# Patient Record
Sex: Male | Born: 1945
Health system: Southern US, Community
[De-identification: ages and names within clinical notes are randomized; demographics above are authoritative.]

## PROBLEM LIST (undated history)

## (undated) DIAGNOSIS — M1990 Unspecified osteoarthritis, unspecified site: Secondary | ICD-10-CM

## (undated) DIAGNOSIS — IMO0001 Reserved for inherently not codable concepts without codable children: Secondary | ICD-10-CM

## (undated) DIAGNOSIS — N183 Chronic kidney disease, stage 3 unspecified: Secondary | ICD-10-CM

## (undated) DIAGNOSIS — H27139 Posterior dislocation of lens, unspecified eye: Secondary | ICD-10-CM

## (undated) DIAGNOSIS — J189 Pneumonia, unspecified organism: Secondary | ICD-10-CM

## (undated) DIAGNOSIS — I10 Essential (primary) hypertension: Secondary | ICD-10-CM

## (undated) DIAGNOSIS — E7800 Pure hypercholesterolemia, unspecified: Secondary | ICD-10-CM

## (undated) DIAGNOSIS — Z5189 Encounter for other specified aftercare: Secondary | ICD-10-CM

## (undated) DIAGNOSIS — I4819 Other persistent atrial fibrillation: Secondary | ICD-10-CM

## (undated) DIAGNOSIS — I428 Other cardiomyopathies: Secondary | ICD-10-CM

## (undated) HISTORY — PX: HERNIA REPAIR: SHX51

## (undated) HISTORY — DX: Posterior dislocation of lens, unspecified eye: H27.139

## (undated) HISTORY — DX: Other persistent atrial fibrillation: I48.19

## (undated) HISTORY — PX: OTHER SURGICAL HISTORY: SHX169

## (undated) HISTORY — DX: Essential (primary) hypertension: I10

## (undated) HISTORY — PX: APPENDECTOMY: SHX54

## (undated) HISTORY — DX: Other cardiomyopathies: I42.8

## (undated) HISTORY — PX: FRACTURE SURGERY: SHX138

---

## 1980-06-12 HISTORY — PX: OTHER SURGICAL HISTORY: SHX169

## 1999-01-20 ENCOUNTER — Other Ambulatory Visit: Admission: RE | Admit: 1999-01-20 | Discharge: 1999-01-20 | Payer: Self-pay | Admitting: Orthopedic Surgery

## 2001-06-01 ENCOUNTER — Emergency Department (HOSPITAL_COMMUNITY): Admission: EM | Admit: 2001-06-01 | Discharge: 2001-06-01 | Payer: Self-pay | Admitting: *Deleted

## 2003-01-12 ENCOUNTER — Ambulatory Visit (HOSPITAL_COMMUNITY): Admission: RE | Admit: 2003-01-12 | Discharge: 2003-01-12 | Payer: Self-pay | Admitting: Gastroenterology

## 2005-06-29 ENCOUNTER — Ambulatory Visit (HOSPITAL_COMMUNITY): Admission: RE | Admit: 2005-06-29 | Discharge: 2005-06-30 | Payer: Self-pay | Admitting: Neurosurgery

## 2006-06-18 ENCOUNTER — Encounter: Admission: RE | Admit: 2006-06-18 | Discharge: 2006-06-18 | Payer: Self-pay | Admitting: Family Medicine

## 2006-07-13 HISTORY — PX: ANTERIOR CERVICAL DISCECTOMY: SHX1160

## 2011-08-13 HISTORY — PX: CATARACT EXTRACTION W/ INTRAOCULAR LENS IMPLANT: SHX1309

## 2011-09-03 HISTORY — PX: CATARACT EXTRACTION W/ INTRAOCULAR LENS IMPLANT: SHX1309

## 2011-10-01 ENCOUNTER — Encounter (INDEPENDENT_AMBULATORY_CARE_PROVIDER_SITE_OTHER): Payer: 59 | Admitting: Ophthalmology

## 2011-10-01 DIAGNOSIS — H27139 Posterior dislocation of lens, unspecified eye: Secondary | ICD-10-CM

## 2011-10-01 DIAGNOSIS — H43819 Vitreous degeneration, unspecified eye: Secondary | ICD-10-CM

## 2011-10-02 ENCOUNTER — Other Ambulatory Visit (INDEPENDENT_AMBULATORY_CARE_PROVIDER_SITE_OTHER): Payer: Self-pay | Admitting: Ophthalmology

## 2011-10-02 DIAGNOSIS — T8522XA Displacement of intraocular lens, initial encounter: Secondary | ICD-10-CM

## 2011-10-02 DIAGNOSIS — H27139 Posterior dislocation of lens, unspecified eye: Secondary | ICD-10-CM

## 2011-10-02 HISTORY — DX: Displacement of intraocular lens, initial encounter: T85.22XA

## 2011-10-02 NOTE — H&P (Signed)
KALONJI ZURAWSKI is an 66 y.o. male.   Chief Complaint: Distorted and poor vision since cataract surgery Right eye HPI: Had cataract surgery Aug 13, 2011.  The vision has been poor since.  PMH: Negative for MI, HIV, Hep  Surgeries:  C-spine, Hernia, Right arm   FMH: Diabetes  Social History:  No smoking.  No Alcohol  Allergies: No known drug allergies  No current facility-administered medications on file as of .   No current outpatient prescriptions on file as of .    Review of systems otherwise negative  There were no vitals taken for this visit.  Physical exam: Mental status: oriented x3. Eyes: See eye exam associated with this surgery on this date.  Scanned in by scanning center Ears, Nose, Throat: within normal limits Neck: Within Normal limits General: within normal limits Chest: Within normal limits Breast: deferred Heart: Within normal limits Abdomen: Within normal limits GU: deferred Extremities: within normal limits Skin: within normal limits  Assessment/Plan Dislocated Intra Ocular Lens Right eye Plan: To [facility] for Pars Plana Vitrectomy, Removal of Intra Ocular Lens, Placement of secondary IOL, Laser treatment. Removal of cataract lens cortex Right eye  Sherrie George 10/02/2011, 7:29 AM 2

## 2011-10-06 ENCOUNTER — Encounter (HOSPITAL_COMMUNITY): Payer: Self-pay | Admitting: Pharmacy Technician

## 2011-10-07 ENCOUNTER — Other Ambulatory Visit (HOSPITAL_COMMUNITY): Payer: Self-pay | Admitting: *Deleted

## 2011-10-12 ENCOUNTER — Encounter (HOSPITAL_COMMUNITY): Payer: Self-pay | Admitting: *Deleted

## 2011-10-19 MED ORDER — GATIFLOXACIN 0.5 % OP SOLN
1.0000 [drp] | OPHTHALMIC | Status: AC | PRN
  Administered 2011-10-20 (×3): 1 [drp] via OPHTHALMIC
  Filled 2011-10-19: qty 2.5

## 2011-10-19 MED ORDER — TROPICAMIDE 1 % OP SOLN
1.0000 [drp] | OPHTHALMIC | Status: AC | PRN
  Administered 2011-10-20 (×3): 1 [drp] via OPHTHALMIC
  Filled 2011-10-19: qty 3

## 2011-10-19 MED ORDER — CYCLOPENTOLATE HCL 1 % OP SOLN
1.0000 [drp] | OPHTHALMIC | Status: AC | PRN
  Administered 2011-10-20 (×3): 1 [drp] via OPHTHALMIC
  Filled 2011-10-19: qty 2

## 2011-10-19 MED ORDER — CEFAZOLIN SODIUM-DEXTROSE 2-3 GM-% IV SOLR
2.0000 g | INTRAVENOUS | Status: AC
  Administered 2011-10-20: 2 g via INTRAVENOUS
  Filled 2011-10-19: qty 50

## 2011-10-19 MED ORDER — PHENYLEPHRINE HCL 2.5 % OP SOLN
1.0000 [drp] | OPHTHALMIC | Status: AC | PRN
  Administered 2011-10-20 (×3): 1 [drp] via OPHTHALMIC
  Filled 2011-10-19: qty 3

## 2011-10-20 ENCOUNTER — Ambulatory Visit (HOSPITAL_COMMUNITY): Payer: 59

## 2011-10-20 ENCOUNTER — Encounter (HOSPITAL_COMMUNITY): Payer: Self-pay | Admitting: Anesthesiology

## 2011-10-20 ENCOUNTER — Encounter (HOSPITAL_COMMUNITY)
Admission: RE | Disposition: A | Discharge status: Home / Self Care | Payer: Self-pay | Source: Ambulatory Visit | Attending: Ophthalmology

## 2011-10-20 ENCOUNTER — Ambulatory Visit (HOSPITAL_COMMUNITY): Payer: 59 | Admitting: Anesthesiology

## 2011-10-20 ENCOUNTER — Encounter (HOSPITAL_COMMUNITY): Payer: Self-pay | Admitting: *Deleted

## 2011-10-20 ENCOUNTER — Ambulatory Visit (HOSPITAL_COMMUNITY)
Admission: RE | Admit: 2011-10-20 | Discharge: 2011-10-21 | Disposition: A | Discharge status: Home / Self Care | Payer: 59 | Source: Ambulatory Visit | Attending: Ophthalmology | Admitting: Ophthalmology

## 2011-10-20 DIAGNOSIS — Y831 Surgical operation with implant of artificial internal device as the cause of abnormal reaction of the patient, or of later complication, without mention of misadventure at the time of the procedure: Secondary | ICD-10-CM | POA: Insufficient documentation

## 2011-10-20 DIAGNOSIS — I10 Essential (primary) hypertension: Secondary | ICD-10-CM

## 2011-10-20 DIAGNOSIS — H27139 Posterior dislocation of lens, unspecified eye: Secondary | ICD-10-CM

## 2011-10-20 DIAGNOSIS — T8522XA Displacement of intraocular lens, initial encounter: Secondary | ICD-10-CM

## 2011-10-20 DIAGNOSIS — Z9849 Cataract extraction status, unspecified eye: Secondary | ICD-10-CM | POA: Insufficient documentation

## 2011-10-20 DIAGNOSIS — T8529XA Other mechanical complication of intraocular lens, initial encounter: Secondary | ICD-10-CM | POA: Insufficient documentation

## 2011-10-20 HISTORY — PX: GAS/FLUID EXCHANGE: SHX5334

## 2011-10-20 HISTORY — DX: Pure hypercholesterolemia, unspecified: E78.00

## 2011-10-20 HISTORY — PX: INTRAOCULAR LENS EXCHANGE: SHX1841

## 2011-10-20 HISTORY — DX: Reserved for inherently not codable concepts without codable children: IMO0001

## 2011-10-20 HISTORY — PX: PARS PLANA VITRECTOMY: SHX2166

## 2011-10-20 HISTORY — DX: Essential (primary) hypertension: I10

## 2011-10-20 HISTORY — DX: Encounter for other specified aftercare: Z51.89

## 2011-10-20 HISTORY — DX: Pneumonia, unspecified organism: J18.9

## 2011-10-20 HISTORY — DX: Unspecified osteoarthritis, unspecified site: M19.90

## 2011-10-20 LAB — CBC
MCV: 85.6 fL (ref 78.0–100.0)
Platelets: 196 10*3/uL (ref 150–400)
RBC: 5.08 MIL/uL (ref 4.22–5.81)
WBC: 7.5 10*3/uL (ref 4.0–10.5)

## 2011-10-20 LAB — BASIC METABOLIC PANEL
CO2: 27 mEq/L (ref 19–32)
Calcium: 9.5 mg/dL (ref 8.4–10.5)
GFR calc Af Amer: 59 mL/min — ABNORMAL LOW (ref >90)
GFR calc non Af Amer: 51 mL/min — ABNORMAL LOW (ref >90)
Sodium: 142 mEq/L (ref 135–145)

## 2011-10-20 LAB — SURGICAL PCR SCREEN
MRSA, PCR: NEGATIVE
Staphylococcus aureus: NEGATIVE

## 2011-10-20 SURGERY — PARS PLANA VITRECTOMY WITH 25G REMOVAL/SUTURE INTRAOCULAR LENS
Anesthesia: General | Site: Eye | Laterality: Right | Wound class: Clean

## 2011-10-20 MED ORDER — TETRACAINE HCL 0.5 % OP SOLN
2.0000 [drp] | Freq: Once | OPHTHALMIC | Status: DC
  Filled 2011-10-20: qty 2

## 2011-10-20 MED ORDER — FENTANYL CITRATE 0.05 MG/ML IJ SOLN
INTRAMUSCULAR | Status: DC | PRN
  Administered 2011-10-20 (×2): 25 ug via INTRAVENOUS
  Administered 2011-10-20: 100 ug via INTRAVENOUS

## 2011-10-20 MED ORDER — MIDAZOLAM HCL 5 MG/5ML IJ SOLN
INTRAMUSCULAR | Status: DC | PRN
  Administered 2011-10-20: 1.5 mg via INTRAVENOUS

## 2011-10-20 MED ORDER — BACITRACIN-POLYMYXIN B 500-10000 UNIT/GM OP OINT
TOPICAL_OINTMENT | OPHTHALMIC | Status: DC | PRN
  Administered 2011-10-20: 1 via OPHTHALMIC

## 2011-10-20 MED ORDER — PHENYLEPHRINE HCL 10 MG/ML IJ SOLN
INTRAMUSCULAR | Status: DC | PRN
  Administered 2011-10-20 (×2): 80 ug via INTRAVENOUS
  Administered 2011-10-20: 160 ug via INTRAVENOUS
  Administered 2011-10-20: 80 ug via INTRAVENOUS

## 2011-10-20 MED ORDER — BUPIVACAINE HCL 0.75 % IJ SOLN
INTRAMUSCULAR | Status: DC | PRN
  Administered 2011-10-20: 10 mL

## 2011-10-20 MED ORDER — ACETAZOLAMIDE SODIUM 500 MG IJ SOLR
500.0000 mg | Freq: Once | INTRAMUSCULAR | Status: AC
  Administered 2011-10-21: 500 mg via INTRAVENOUS
  Filled 2011-10-20: qty 500

## 2011-10-20 MED ORDER — STERILE WATER FOR IRRIGATION IR SOLN
Status: DC | PRN
  Administered 2011-10-20: 200 mL

## 2011-10-20 MED ORDER — EPINEPHRINE HCL 1 MG/ML IJ SOLN
INTRAOCULAR | Status: DC | PRN
  Administered 2011-10-20: 14:00:00

## 2011-10-20 MED ORDER — MORPHINE SULFATE 2 MG/ML IJ SOLN: 1.0000 mg | INTRAMUSCULAR | Status: DC | PRN

## 2011-10-20 MED ORDER — GATIFLOXACIN 0.5 % OP SOLN
1.0000 [drp] | Freq: Four times a day (QID) | OPHTHALMIC | Status: DC
  Filled 2011-10-20: qty 2.5

## 2011-10-20 MED ORDER — MAGNESIUM HYDROXIDE 400 MG/5ML PO SUSP: 15.0000 mL | Freq: Four times a day (QID) | ORAL | Status: DC | PRN

## 2011-10-20 MED ORDER — BSS IO SOLN
INTRAOCULAR | Status: DC | PRN
  Administered 2011-10-20: 15 mL via INTRAOCULAR

## 2011-10-20 MED ORDER — LIDOCAINE HCL (CARDIAC) 20 MG/ML IV SOLN
INTRAVENOUS | Status: DC | PRN
  Administered 2011-10-20: 60 mg via INTRAVENOUS

## 2011-10-20 MED ORDER — ROCURONIUM BROMIDE 100 MG/10ML IV SOLN
INTRAVENOUS | Status: DC | PRN
  Administered 2011-10-20: 10 mg via INTRAVENOUS
  Administered 2011-10-20: 60 mg via INTRAVENOUS
  Administered 2011-10-20: 5 mg via INTRAVENOUS

## 2011-10-20 MED ORDER — BRIMONIDINE TARTRATE 0.2 % OP SOLN
1.0000 [drp] | Freq: Two times a day (BID) | OPHTHALMIC | Status: DC
  Filled 2011-10-20: qty 5

## 2011-10-20 MED ORDER — SODIUM CHLORIDE 0.45 % IV SOLN
INTRAVENOUS | Status: DC
  Administered 2011-10-20: 16:00:00 via INTRAVENOUS

## 2011-10-20 MED ORDER — PHENYLEPHRINE HCL 10 MG/ML IJ SOLN
10.0000 mg | INTRAVENOUS | Status: DC | PRN
  Administered 2011-10-20: 10 ug/min via INTRAVENOUS

## 2011-10-20 MED ORDER — TEMAZEPAM 15 MG PO CAPS: 15.0000 mg | ORAL_CAPSULE | Freq: Every evening | ORAL | Status: DC | PRN

## 2011-10-20 MED ORDER — BACITRACIN-POLYMYXIN B 500-10000 UNIT/GM OP OINT
1.0000 "application " | TOPICAL_OINTMENT | Freq: Four times a day (QID) | OPHTHALMIC | Status: DC
  Filled 2011-10-20: qty 3.5

## 2011-10-20 MED ORDER — ONDANSETRON HCL 4 MG/2ML IJ SOLN
INTRAMUSCULAR | Status: DC | PRN
  Administered 2011-10-20: 4 mg via INTRAVENOUS

## 2011-10-20 MED ORDER — SODIUM CHLORIDE 0.9 % IV SOLN
INTRAVENOUS | Status: DC
  Administered 2011-10-20 (×3): via INTRAVENOUS

## 2011-10-20 MED ORDER — BSS PLUS IO SOLN
INTRAOCULAR | Status: DC | PRN
  Administered 2011-10-20: 1 via INTRAOCULAR

## 2011-10-20 MED ORDER — NEOSTIGMINE METHYLSULFATE 1 MG/ML IJ SOLN
INTRAMUSCULAR | Status: DC | PRN
  Administered 2011-10-20: 5 mg via INTRAVENOUS

## 2011-10-20 MED ORDER — PROMETHAZINE HCL 25 MG/ML IJ SOLN: 6.2500 mg | INTRAMUSCULAR | Status: DC | PRN

## 2011-10-20 MED ORDER — SODIUM CHLORIDE 0.9 % IR SOLN
Status: DC | PRN
  Administered 2011-10-20: 200 mL

## 2011-10-20 MED ORDER — ONDANSETRON HCL 4 MG/2ML IJ SOLN: 4.0000 mg | Freq: Four times a day (QID) | INTRAMUSCULAR | Status: DC | PRN

## 2011-10-20 MED ORDER — PROPOFOL 10 MG/ML IV EMUL
INTRAVENOUS | Status: DC | PRN
  Administered 2011-10-20: 160 mg via INTRAVENOUS

## 2011-10-20 MED ORDER — GLYCOPYRROLATE 0.2 MG/ML IJ SOLN
INTRAMUSCULAR | Status: DC | PRN
  Administered 2011-10-20: .7 mg via INTRAVENOUS

## 2011-10-20 MED ORDER — OXYCODONE-ACETAMINOPHEN 5-325 MG PO TABS
1.0000 | ORAL_TABLET | ORAL | Status: DC | PRN
  Administered 2011-10-20 – 2011-10-21 (×2): 1 via ORAL
  Filled 2011-10-20 (×2): qty 1

## 2011-10-20 MED ORDER — MUPIROCIN 2 % EX OINT
TOPICAL_OINTMENT | CUTANEOUS | Status: AC
  Administered 2011-10-20: 1 via NASAL
  Filled 2011-10-20: qty 22

## 2011-10-20 MED ORDER — HYDROMORPHONE HCL PF 1 MG/ML IJ SOLN: 0.2500 mg | INTRAMUSCULAR | Status: DC | PRN

## 2011-10-20 MED ORDER — SODIUM CHLORIDE 0.9 % IJ SOLN
INTRAMUSCULAR | Status: DC | PRN
  Administered 2011-10-20: 14:00:00

## 2011-10-20 MED ORDER — HEMOSTATIC AGENTS (NO CHARGE) OPTIME
TOPICAL | Status: DC | PRN
  Administered 2011-10-20: 1

## 2011-10-20 MED ORDER — PREDNISOLONE ACETATE 1 % OP SUSP
1.0000 [drp] | Freq: Four times a day (QID) | OPHTHALMIC | Status: DC
  Filled 2011-10-20: qty 1

## 2011-10-20 MED ORDER — DEXAMETHASONE SODIUM PHOSPHATE 10 MG/ML IJ SOLN
INTRAMUSCULAR | Status: DC | PRN
  Administered 2011-10-20: 10 mg

## 2011-10-20 MED ORDER — LATANOPROST 0.005 % OP SOLN
1.0000 [drp] | Freq: Every day | OPHTHALMIC | Status: DC
  Filled 2011-10-20: qty 2.5

## 2011-10-20 MED ORDER — PROVISC 10 MG/ML IO SOLN
INTRAOCULAR | Status: DC | PRN
  Administered 2011-10-20: .85 mL via INTRAOCULAR

## 2011-10-20 MED ORDER — MEPERIDINE HCL 25 MG/ML IJ SOLN: 6.2500 mg | INTRAMUSCULAR | Status: DC | PRN

## 2011-10-20 MED ORDER — ACETAMINOPHEN 325 MG PO TABS: 325.0000 mg | ORAL_TABLET | ORAL | Status: DC | PRN

## 2011-10-20 SURGICAL SUPPLY — 65 items
ACCESSORY FRAGMATOME (MISCELLANEOUS) IMPLANT
APPLICATOR DR MATTHEWS STRL (MISCELLANEOUS) ×10 IMPLANT
BLADE EYE CATARACT 19 1.4 BEAV (BLADE) IMPLANT
BLADE KERATOME 2.75 (BLADE) ×2 IMPLANT
CANNULA FLEX TIP 25G (CANNULA) IMPLANT
CLOTH BEACON ORANGE TIMEOUT ST (SAFETY) ×2 IMPLANT
CORDS BIPOLAR (ELECTRODE) IMPLANT
COTTONBALL LRG STERILE PKG (GAUZE/BANDAGES/DRESSINGS) ×6 IMPLANT
COVER MAYO STAND STRL (DRAPES) IMPLANT
DRAPE OPHTHALMIC 77X100 STRL (CUSTOM PROCEDURE TRAY) ×2 IMPLANT
FILTER BLUE MILLIPORE (MISCELLANEOUS) IMPLANT
FORCEPS ECKARDT ILM 25G SERR (OPHTHALMIC RELATED) IMPLANT
FORCEPS HORIZONTAL 25G DISP (OPHTHALMIC RELATED) ×2 IMPLANT
GAS OPHTHALMIC (MISCELLANEOUS) IMPLANT
GLOVE BIOGEL PI IND STRL 6.5 (GLOVE) ×1 IMPLANT
GLOVE BIOGEL PI INDICATOR 6.5 (GLOVE) ×1
GLOVE ECLIPSE 6.5 STRL STRAW (GLOVE) ×2 IMPLANT
GLOVE SS BIOGEL STRL SZ 6.5 (GLOVE) ×1 IMPLANT
GLOVE SS BIOGEL STRL SZ 7 (GLOVE) ×1 IMPLANT
GLOVE SUPERSENSE BIOGEL SZ 6.5 (GLOVE) ×1
GLOVE SUPERSENSE BIOGEL SZ 7 (GLOVE) ×1
GLOVE SURG 8.5 LATEX PF (GLOVE) ×2 IMPLANT
GOWN STRL NON-REIN LRG LVL3 (GOWN DISPOSABLE) ×10 IMPLANT
ILLUMINATOR CHOW PICK 25GA (MISCELLANEOUS) ×2 IMPLANT
KIT BASIN OR (CUSTOM PROCEDURE TRAY) ×2 IMPLANT
KIT ROOM TURNOVER OR (KITS) ×2 IMPLANT
KNIFE CRESCENT 1.75 EDGEAHEAD (BLADE) ×2 IMPLANT
LENS IOL POST 1PIECE DIOP 17.0 (Intraocular Lens) ×2 IMPLANT
MARKER SKIN DUAL TIP RULER LAB (MISCELLANEOUS) IMPLANT
MASK EYE SHIELD (GAUZE/BANDAGES/DRESSINGS) ×2 IMPLANT
MICROPICK 25G (MISCELLANEOUS)
NEEDLE 18GX1X1/2 (RX/OR ONLY) (NEEDLE) ×2 IMPLANT
NEEDLE 25GX 5/8IN NON SAFETY (NEEDLE) ×2 IMPLANT
NEEDLE 27GAX1X1/2 (NEEDLE) ×2 IMPLANT
NEEDLE FILTER BLUNT 18X 1/2SAF (NEEDLE) ×1
NEEDLE FILTER BLUNT 18X1 1/2 (NEEDLE) ×1 IMPLANT
NEEDLE HYPO 30X.5 LL (NEEDLE) ×4 IMPLANT
NS IRRIG 1000ML POUR BTL (IV SOLUTION) ×2 IMPLANT
PACK VITRECTOMY CUSTOM (CUSTOM PROCEDURE TRAY) ×2 IMPLANT
PAD ARMBOARD 7.5X6 YLW CONV (MISCELLANEOUS) ×4 IMPLANT
PAD EYE OVAL STERILE LF (GAUZE/BANDAGES/DRESSINGS) ×4 IMPLANT
PAK VITRECTOMY PIK 25 GA (OPHTHALMIC RELATED) ×2 IMPLANT
PENCIL BIPOLAR 25GA STR DISP (OPHTHALMIC RELATED) IMPLANT
PICK MICROPICK 25G (MISCELLANEOUS) IMPLANT
PROBE DIRECTIONAL LASER (MISCELLANEOUS) IMPLANT
PROBE VITREOUS 25+ ACCURUS (MISCELLANEOUS) IMPLANT
ROLLS DENTAL (MISCELLANEOUS) ×4 IMPLANT
SCRAPER DIAMOND 25GA (OPHTHALMIC RELATED) IMPLANT
SPONGE SURGIFOAM ABS GEL 12-7 (HEMOSTASIS) ×2 IMPLANT
STOPCOCK 4 WAY LG BORE MALE ST (IV SETS) IMPLANT
SUT CHROMIC 7 0 TG140 8 (SUTURE) IMPLANT
SUT ETHILON 10 0 CS140 6 (SUTURE) ×2 IMPLANT
SUT ETHILON 9 0 BV100 4 (SUTURE) IMPLANT
SUT POLY NON ABSORB 10-0 8 STR (SUTURE) ×4 IMPLANT
SUT SILK 4 0 RB 1 (SUTURE) IMPLANT
SYR 20CC LL (SYRINGE) ×2 IMPLANT
SYR 5ML LL (SYRINGE) IMPLANT
SYR BULB 3OZ (MISCELLANEOUS) ×2 IMPLANT
SYR TB 1ML LUER SLIP (SYRINGE) ×2 IMPLANT
SYRINGE 10CC LL (SYRINGE) IMPLANT
TAPE SURG TRANSPORE 1 IN (GAUZE/BANDAGES/DRESSINGS) ×1 IMPLANT
TAPE SURGICAL TRANSPORE 1 IN (GAUZE/BANDAGES/DRESSINGS) ×1
TOWEL OR 17X24 6PK STRL BLUE (TOWEL DISPOSABLE) ×6 IMPLANT
TROCAR CANNULA 25GA (CANNULA) IMPLANT
WATER STERILE IRR 1000ML POUR (IV SOLUTION) ×2 IMPLANT

## 2011-10-20 NOTE — Transfer of Care (Signed)
Immediate Anesthesia Transfer of Care Note  Patient: John Cobb  Procedure(s) Performed: Procedure(s) (LRB): PARS PLANA VITRECTOMY WITH 25G REMOVAL/SUTURE INTRAOCULAR LENS (Right) LASER PHOTO ABLATION (Right) GAS/FLUID EXCHANGE (Right)  Patient Location: PACU  Anesthesia Type: General  Level of Consciousness: awake and alert   Airway & Oxygen Therapy: Patient Spontanous Breathing and Patient connected to nasal cannula oxygen  Post-op Assessment: Report given to PACU RN and Post -op Vital signs reviewed and stable  Post vital signs: Reviewed and stable  Complications: No apparent anesthesia complications

## 2011-10-20 NOTE — Brief Op Note (Signed)
Brief Operative note   Preoperative diagnosis:  Pre-Op Diagnosis Codes:    * Posterior dislocation of lens [379.34] Postoperative diagnosis  Post-Op Diagnosis Codes:    * Posterior dislocation of lens [379.34]  Procedures: Pars Plana Vitrectomy, removal of IOL from vitreous, Laser, Placement of secondary IOL with suture right eye  Surgeon:  Sherrie George, MD...  Assistant:  Rosalie Doctor SA    Anesthesia: General  Specimen: none  Estimated blood loss:  1cc  Complications: none  Patient sent to PACU in good condition  Composed by Sherrie George MD  Dictation number: [phone]

## 2011-10-20 NOTE — Anesthesia Preprocedure Evaluation (Signed)
Anesthesia Evaluation  Patient identified by MRN, date of birth, ID band Patient awake    Reviewed: Allergy & Precautions, H&P , NPO status , Patient's Chart, lab work & pertinent test results  History of Anesthesia Complications Negative for: history of anesthetic complications  Airway Mallampati: I  Neck ROM: Full    Dental  (+) Teeth Intact   Pulmonary neg pulmonary ROS,  breath sounds clear to auscultation        Cardiovascular hypertension, Rhythm:Regular Rate:Normal     Neuro/Psych    GI/Hepatic negative GI ROS, Neg liver ROS,   Endo/Other  negative endocrine ROS  Renal/GU negative Renal ROS     Musculoskeletal   Abdominal   Peds  Hematology negative hematology ROS (+)   Anesthesia Other Findings   Reproductive/Obstetrics                           Anesthesia Physical Anesthesia Plan  ASA: II  Anesthesia Plan: General   Post-op Pain Management:    Induction: Intravenous  Airway Management Planned: Oral ETT  Additional Equipment:   Intra-op Plan:   Post-operative Plan: Extubation in OR  Informed Consent: I have reviewed the patients History and Physical, chart, labs and discussed the procedure including the risks, benefits and alternatives for the proposed anesthesia with the patient or authorized representative who has indicated his/her understanding and acceptance.   Dental advisory given  Plan Discussed with: CRNA and Surgeon  Anesthesia Plan Comments:         Anesthesia Quick Evaluation

## 2011-10-20 NOTE — H&P (Signed)
I examined the patient today and there is no change in the medical status 

## 2011-10-20 NOTE — Anesthesia Postprocedure Evaluation (Signed)
  Anesthesia Post-op Note  Patient: John Cobb  Procedure(s) Performed: Procedure(s) (LRB): PARS PLANA VITRECTOMY WITH 25G REMOVAL/SUTURE INTRAOCULAR LENS (Right) LASER PHOTO ABLATION (Right) GAS/FLUID EXCHANGE (Right)  Patient Location: PACU  Anesthesia Type: General  Level of Consciousness: awake  Airway and Oxygen Therapy: Patient Spontanous Breathing  Post-op Pain: mild  Post-op Assessment: Post-op Vital signs reviewed  Post-op Vital Signs: stable  Complications: No apparent anesthesia complications

## 2011-10-20 NOTE — Discharge Summary (Signed)
Discharge summary not needed on [facility] patients per medical records. 

## 2011-10-21 ENCOUNTER — Encounter (HOSPITAL_COMMUNITY): Payer: Self-pay | Admitting: Ophthalmology

## 2011-10-21 MED ORDER — GATIFLOXACIN 0.5 % OP SOLN: 1.0000 [drp] | Freq: Four times a day (QID) | OPHTHALMIC | Status: DC

## 2011-10-21 MED ORDER — BACITRACIN-POLYMYXIN B 500-10000 UNIT/GM OP OINT: 1.0000 "application " | TOPICAL_OINTMENT | Freq: Four times a day (QID) | OPHTHALMIC | Status: AC

## 2011-10-21 MED ORDER — PREDNISOLONE ACETATE 1 % OP SUSP: 1.0000 [drp] | Freq: Four times a day (QID) | OPHTHALMIC | Status: AC

## 2011-10-21 NOTE — Progress Notes (Signed)
10/21/2011, 6:53 AM  Mental Status:  Awake, Alert, Oriented  Anterior segment: Cornea  Clear    Anterior Chamber Clear    Lens:    IOL  Intra Ocular Pressure 20 mmHg with Tonopen  Vitreous: Clear 20%gas bubble  Retina:  Attached Good laser reaction  Impression: Excellent result Retina attached  Final Diagnosis: Principal Problem:  *Dislocated IOL (intraocular lens), posterior   Plan: start post operative eye drops.  Discharge to home.  Give post operative instructions  Sherrie George 10/21/2011, 6:53 AM

## 2011-10-21 NOTE — Discharge Summary (Signed)
Discharge summary not needed on [facility] patients per medical records. 

## 2011-10-21 NOTE — Op Note (Signed)
NAMETORIAN, QUINTERO               ACCOUNT NO.:  [id number]  MEDICAL RECORD NO.:  [id number]  LOCATION:  5127                         FACILITY:  [facility]  PHYSICIAN:  Beulah Gandy. Ashley Royalty, M.D. DATE OF BIRTH:  10-25-1945  DATE OF PROCEDURE:  10/20/2011 DATE OF DISCHARGE:                              OPERATIVE REPORT   ADMISSION DIAGNOSIS:  Dislocated intraocular lens in the vitreous, right eye.  PROCEDURES:  Pars plana vitrectomy, removal of dislocated intraocular lens from the vitreous, retinal photocoagulation, placement of secondary intraocular lens with suture, and gas-fluid exchange in the right eye.  SURGEON:  Beulah Gandy. Ashley Royalty, MD  ASSISTANT:  Rosalie Doctor, MA  ANESTHESIA:  General.  DETAILS:  Usual prep and drape.  Conjunctival peritomy from 8 o'clock around to 4 o'clock.  The limbus was exposed from 8 o'clock around to 4 o'clock.  The half-thickness scleral flaps were raised at 3 and 9 oclock. Hemostasis was obtained with diathermy.  The 25-gauge trocars were placed at 8, 10, and 2 o'clock.  A 3 layered corneoscleral wound was created from 10 o'clock to 2 o'clock.  The pars plana vitrectomy was begun just behind the pupillary axis.  Infusion was placed at 8 o'clock.  The intraocular lens was immediately seen.  The vitreous was trimmed from around the intraocular lens and it was freed in the mid vitreous.  The 3 layered corneoscleral wound was opened with keratome, and the lens was passed from posterior segment into the anterior chamber and out through the corneal wound.  Additional vitrectomy was carried out at this point removing all core vitreous and mid peripheral vitreous.  The vitreous, which had prolapsed through the wound also was removed.  There were no vitreous adhesions at this point.  The capsular remnants were trimmed with the vitreous cutter, and cortical material was removed at 12 o'clock.  The vitrectomy was carried posteriorly, so additional vitreous and  pigment was removed from the macular surface.  Two 10-0 Prolene sutures were placed from 3 o'clock to 9 o'clock posterior to the iris and anterior to the capsular remnants.  These sutures were externalized through the corneal wound.  A new intraocular lens made by [employer], model CC70BD, power 17.0 D, length 12.5 mm, optic 7.0 mm, serial number [id number] was brought onto the field.  The expiration date was June 2016.  The lens was inspected and cleaned.  The sutures were externalized through the corneoscleral wound and attached to the eyelets of the lens.  They were trimmed appropriately.  The lens was then passed into the anterior chamber then into the posterior chamber, dialed into place, and the sutures were pulled securely beneath the scleral flaps.  The lens was found to be in proper position and the 10-0 Prolenes were sutured beneath the scleral flap.  The flaps were allowed to lie over the suture knots.  The corneal wound was closed with 5 interrupted 10-0 nylon sutures.  The wound was tested and found to be secure.  Additional vitrectomy was carried out at this point removing additional pigment granules from the vitreous cavity and the macular surface.  The vitreous was trimmed back to the vitreous base.  The indirect ophthalmoscope laser was moved into place.  960 burns were placed around the retinal periphery.  The power was 250-300 mW and the size was 1000 mW, duration 0.07 seconds.  The laser was placed around the areas of weakened retina in the periphery.  There were areas of white without pressure and other areas that appeared to be preretinal breaks.  The vitrectomy was continued and then the gas-fluid exchange, 30%, was performed.  The instruments were removed from the eye, and the 25-gauge trocars were removed as well.  The conjunctiva was closed with 7-0 chromic suture.  Polymyxin and gentamicin were irrigated into tenon space.  Marcaine was injected  around the globe for postop pain. Decadron 10 mg was injected into the lower subconjunctival space. Closing pressure was 10 with a Barraquer tonometer.  COMPLICATIONS:  None.  DURATION:  2 hours.  The patient was awakened and taken to recovery in satisfactory condition.     Beulah Gandy. Ashley Royalty, M.D.     JDM/MEDQ  D:  10/20/2011  T:  10/21/2011  Job:  [id number]

## 2011-10-21 NOTE — Progress Notes (Signed)
Discharge home. Home discharge instruction given to patient, no question verbalized. Alert and oriented, not in any distress, right eye still red, no drainage noted.

## 2011-10-27 ENCOUNTER — Inpatient Hospital Stay (INDEPENDENT_AMBULATORY_CARE_PROVIDER_SITE_OTHER): Payer: 59 | Admitting: Ophthalmology

## 2011-10-27 DIAGNOSIS — H2700 Aphakia, unspecified eye: Secondary | ICD-10-CM

## 2011-11-03 ENCOUNTER — Ambulatory Visit (INDEPENDENT_AMBULATORY_CARE_PROVIDER_SITE_OTHER): Payer: 59 | Admitting: Ophthalmology

## 2011-11-03 DIAGNOSIS — H2700 Aphakia, unspecified eye: Secondary | ICD-10-CM

## 2011-11-25 ENCOUNTER — Encounter (INDEPENDENT_AMBULATORY_CARE_PROVIDER_SITE_OTHER): Payer: 59 | Admitting: Ophthalmology

## 2011-11-25 DIAGNOSIS — H2700 Aphakia, unspecified eye: Secondary | ICD-10-CM

## 2012-02-08 ENCOUNTER — Encounter (INDEPENDENT_AMBULATORY_CARE_PROVIDER_SITE_OTHER): Payer: 59 | Admitting: Ophthalmology

## 2012-02-08 DIAGNOSIS — H35359 Cystoid macular degeneration, unspecified eye: Secondary | ICD-10-CM

## 2012-02-08 DIAGNOSIS — I10 Essential (primary) hypertension: Secondary | ICD-10-CM

## 2012-02-08 DIAGNOSIS — H35039 Hypertensive retinopathy, unspecified eye: Secondary | ICD-10-CM

## 2012-02-08 DIAGNOSIS — H43819 Vitreous degeneration, unspecified eye: Secondary | ICD-10-CM

## 2012-03-07 ENCOUNTER — Encounter (INDEPENDENT_AMBULATORY_CARE_PROVIDER_SITE_OTHER): Payer: 59 | Admitting: Ophthalmology

## 2012-03-07 DIAGNOSIS — H35039 Hypertensive retinopathy, unspecified eye: Secondary | ICD-10-CM

## 2012-03-07 DIAGNOSIS — I10 Essential (primary) hypertension: Secondary | ICD-10-CM

## 2012-03-07 DIAGNOSIS — H35359 Cystoid macular degeneration, unspecified eye: Secondary | ICD-10-CM

## 2012-03-07 DIAGNOSIS — H43819 Vitreous degeneration, unspecified eye: Secondary | ICD-10-CM

## 2012-03-28 DIAGNOSIS — T63461A Toxic effect of venom of wasps, accidental (unintentional), initial encounter: Secondary | ICD-10-CM | POA: Diagnosis not present

## 2012-03-28 DIAGNOSIS — T6391XA Toxic effect of contact with unspecified venomous animal, accidental (unintentional), initial encounter: Secondary | ICD-10-CM | POA: Diagnosis not present

## 2012-04-13 DIAGNOSIS — Z23 Encounter for immunization: Secondary | ICD-10-CM | POA: Diagnosis not present

## 2012-04-18 ENCOUNTER — Encounter (INDEPENDENT_AMBULATORY_CARE_PROVIDER_SITE_OTHER): Payer: Medicare Other | Admitting: Ophthalmology

## 2012-04-18 DIAGNOSIS — H35039 Hypertensive retinopathy, unspecified eye: Secondary | ICD-10-CM

## 2012-04-18 DIAGNOSIS — I10 Essential (primary) hypertension: Secondary | ICD-10-CM

## 2012-04-18 DIAGNOSIS — H35359 Cystoid macular degeneration, unspecified eye: Secondary | ICD-10-CM

## 2012-04-18 DIAGNOSIS — H43819 Vitreous degeneration, unspecified eye: Secondary | ICD-10-CM

## 2012-04-25 DIAGNOSIS — T6391XA Toxic effect of contact with unspecified venomous animal, accidental (unintentional), initial encounter: Secondary | ICD-10-CM | POA: Diagnosis not present

## 2012-04-25 DIAGNOSIS — T63461A Toxic effect of venom of wasps, accidental (unintentional), initial encounter: Secondary | ICD-10-CM | POA: Diagnosis not present

## 2012-05-17 DIAGNOSIS — T6391XA Toxic effect of contact with unspecified venomous animal, accidental (unintentional), initial encounter: Secondary | ICD-10-CM | POA: Diagnosis not present

## 2012-05-17 DIAGNOSIS — T63461A Toxic effect of venom of wasps, accidental (unintentional), initial encounter: Secondary | ICD-10-CM | POA: Diagnosis not present

## 2012-05-24 DIAGNOSIS — T6391XA Toxic effect of contact with unspecified venomous animal, accidental (unintentional), initial encounter: Secondary | ICD-10-CM | POA: Diagnosis not present

## 2012-05-24 DIAGNOSIS — T63461A Toxic effect of venom of wasps, accidental (unintentional), initial encounter: Secondary | ICD-10-CM | POA: Diagnosis not present

## 2012-06-20 ENCOUNTER — Encounter (INDEPENDENT_AMBULATORY_CARE_PROVIDER_SITE_OTHER): Payer: Medicare Other | Admitting: Ophthalmology

## 2012-06-20 DIAGNOSIS — H43819 Vitreous degeneration, unspecified eye: Secondary | ICD-10-CM

## 2012-06-20 DIAGNOSIS — H35039 Hypertensive retinopathy, unspecified eye: Secondary | ICD-10-CM

## 2012-06-20 DIAGNOSIS — I10 Essential (primary) hypertension: Secondary | ICD-10-CM

## 2012-06-20 DIAGNOSIS — H35359 Cystoid macular degeneration, unspecified eye: Secondary | ICD-10-CM | POA: Diagnosis not present

## 2012-06-23 DIAGNOSIS — N401 Enlarged prostate with lower urinary tract symptoms: Secondary | ICD-10-CM | POA: Diagnosis not present

## 2012-06-23 DIAGNOSIS — R972 Elevated prostate specific antigen [PSA]: Secondary | ICD-10-CM | POA: Diagnosis not present

## 2012-06-23 DIAGNOSIS — N529 Male erectile dysfunction, unspecified: Secondary | ICD-10-CM | POA: Diagnosis not present

## 2012-06-23 DIAGNOSIS — T6391XA Toxic effect of contact with unspecified venomous animal, accidental (unintentional), initial encounter: Secondary | ICD-10-CM | POA: Diagnosis not present

## 2012-07-15 DIAGNOSIS — R799 Abnormal finding of blood chemistry, unspecified: Secondary | ICD-10-CM | POA: Diagnosis not present

## 2012-07-15 DIAGNOSIS — R7301 Impaired fasting glucose: Secondary | ICD-10-CM | POA: Diagnosis not present

## 2012-07-15 DIAGNOSIS — N529 Male erectile dysfunction, unspecified: Secondary | ICD-10-CM | POA: Diagnosis not present

## 2012-07-15 DIAGNOSIS — R972 Elevated prostate specific antigen [PSA]: Secondary | ICD-10-CM | POA: Diagnosis not present

## 2012-07-15 DIAGNOSIS — E782 Mixed hyperlipidemia: Secondary | ICD-10-CM | POA: Diagnosis not present

## 2012-07-15 DIAGNOSIS — Z0000 Encounter for general adult medical examination without abnormal findings: Secondary | ICD-10-CM | POA: Diagnosis not present

## 2012-07-15 DIAGNOSIS — I10 Essential (primary) hypertension: Secondary | ICD-10-CM | POA: Diagnosis not present

## 2012-07-19 DIAGNOSIS — T63461A Toxic effect of venom of wasps, accidental (unintentional), initial encounter: Secondary | ICD-10-CM | POA: Diagnosis not present

## 2012-07-19 DIAGNOSIS — T6391XA Toxic effect of contact with unspecified venomous animal, accidental (unintentional), initial encounter: Secondary | ICD-10-CM | POA: Diagnosis not present

## 2012-08-23 DIAGNOSIS — T63461A Toxic effect of venom of wasps, accidental (unintentional), initial encounter: Secondary | ICD-10-CM | POA: Diagnosis not present

## 2012-08-23 DIAGNOSIS — T6391XA Toxic effect of contact with unspecified venomous animal, accidental (unintentional), initial encounter: Secondary | ICD-10-CM | POA: Diagnosis not present

## 2012-08-29 ENCOUNTER — Encounter (INDEPENDENT_AMBULATORY_CARE_PROVIDER_SITE_OTHER): Payer: Medicare Other | Admitting: Ophthalmology

## 2012-08-29 DIAGNOSIS — H35039 Hypertensive retinopathy, unspecified eye: Secondary | ICD-10-CM

## 2012-08-29 DIAGNOSIS — H43819 Vitreous degeneration, unspecified eye: Secondary | ICD-10-CM

## 2012-08-29 DIAGNOSIS — H35359 Cystoid macular degeneration, unspecified eye: Secondary | ICD-10-CM

## 2012-08-29 DIAGNOSIS — I10 Essential (primary) hypertension: Secondary | ICD-10-CM

## 2012-09-06 DIAGNOSIS — N63 Unspecified lump in unspecified breast: Secondary | ICD-10-CM | POA: Diagnosis not present

## 2012-09-20 DIAGNOSIS — T6391XA Toxic effect of contact with unspecified venomous animal, accidental (unintentional), initial encounter: Secondary | ICD-10-CM | POA: Diagnosis not present

## 2012-10-13 DIAGNOSIS — H01009 Unspecified blepharitis unspecified eye, unspecified eyelid: Secondary | ICD-10-CM | POA: Diagnosis not present

## 2012-10-13 DIAGNOSIS — H00029 Hordeolum internum unspecified eye, unspecified eyelid: Secondary | ICD-10-CM | POA: Diagnosis not present

## 2012-10-18 DIAGNOSIS — M542 Cervicalgia: Secondary | ICD-10-CM | POA: Diagnosis not present

## 2012-10-25 DIAGNOSIS — T6391XA Toxic effect of contact with unspecified venomous animal, accidental (unintentional), initial encounter: Secondary | ICD-10-CM | POA: Diagnosis not present

## 2012-10-25 DIAGNOSIS — T63461A Toxic effect of venom of wasps, accidental (unintentional), initial encounter: Secondary | ICD-10-CM | POA: Diagnosis not present

## 2012-11-23 DIAGNOSIS — T6391XA Toxic effect of contact with unspecified venomous animal, accidental (unintentional), initial encounter: Secondary | ICD-10-CM | POA: Diagnosis not present

## 2012-11-23 DIAGNOSIS — T63461A Toxic effect of venom of wasps, accidental (unintentional), initial encounter: Secondary | ICD-10-CM | POA: Diagnosis not present

## 2012-11-28 ENCOUNTER — Encounter (INDEPENDENT_AMBULATORY_CARE_PROVIDER_SITE_OTHER): Payer: Medicare Other | Admitting: Ophthalmology

## 2012-11-28 DIAGNOSIS — I10 Essential (primary) hypertension: Secondary | ICD-10-CM

## 2012-11-28 DIAGNOSIS — H35039 Hypertensive retinopathy, unspecified eye: Secondary | ICD-10-CM

## 2012-11-28 DIAGNOSIS — H43819 Vitreous degeneration, unspecified eye: Secondary | ICD-10-CM

## 2012-11-28 DIAGNOSIS — H35359 Cystoid macular degeneration, unspecified eye: Secondary | ICD-10-CM | POA: Diagnosis not present

## 2012-12-21 DIAGNOSIS — J309 Allergic rhinitis, unspecified: Secondary | ICD-10-CM | POA: Diagnosis not present

## 2012-12-21 DIAGNOSIS — T63461A Toxic effect of venom of wasps, accidental (unintentional), initial encounter: Secondary | ICD-10-CM | POA: Diagnosis not present

## 2012-12-21 DIAGNOSIS — T6391XA Toxic effect of contact with unspecified venomous animal, accidental (unintentional), initial encounter: Secondary | ICD-10-CM | POA: Diagnosis not present

## 2012-12-26 DIAGNOSIS — E782 Mixed hyperlipidemia: Secondary | ICD-10-CM | POA: Diagnosis not present

## 2012-12-26 DIAGNOSIS — I10 Essential (primary) hypertension: Secondary | ICD-10-CM | POA: Diagnosis not present

## 2012-12-26 DIAGNOSIS — R7301 Impaired fasting glucose: Secondary | ICD-10-CM | POA: Diagnosis not present

## 2012-12-26 DIAGNOSIS — Z1331 Encounter for screening for depression: Secondary | ICD-10-CM | POA: Diagnosis not present

## 2012-12-26 DIAGNOSIS — R972 Elevated prostate specific antigen [PSA]: Secondary | ICD-10-CM | POA: Diagnosis not present

## 2012-12-26 DIAGNOSIS — Z0000 Encounter for general adult medical examination without abnormal findings: Secondary | ICD-10-CM | POA: Diagnosis not present

## 2012-12-27 DIAGNOSIS — D235 Other benign neoplasm of skin of trunk: Secondary | ICD-10-CM | POA: Diagnosis not present

## 2012-12-27 DIAGNOSIS — H00019 Hordeolum externum unspecified eye, unspecified eyelid: Secondary | ICD-10-CM | POA: Diagnosis not present

## 2012-12-27 DIAGNOSIS — L570 Actinic keratosis: Secondary | ICD-10-CM | POA: Diagnosis not present

## 2012-12-27 DIAGNOSIS — L259 Unspecified contact dermatitis, unspecified cause: Secondary | ICD-10-CM | POA: Diagnosis not present

## 2013-01-25 DIAGNOSIS — T63461A Toxic effect of venom of wasps, accidental (unintentional), initial encounter: Secondary | ICD-10-CM | POA: Diagnosis not present

## 2013-01-25 DIAGNOSIS — H0019 Chalazion unspecified eye, unspecified eyelid: Secondary | ICD-10-CM | POA: Diagnosis not present

## 2013-01-25 DIAGNOSIS — T6391XA Toxic effect of contact with unspecified venomous animal, accidental (unintentional), initial encounter: Secondary | ICD-10-CM | POA: Diagnosis not present

## 2013-02-21 DIAGNOSIS — T6391XA Toxic effect of contact with unspecified venomous animal, accidental (unintentional), initial encounter: Secondary | ICD-10-CM | POA: Diagnosis not present

## 2013-02-21 DIAGNOSIS — N63 Unspecified lump in unspecified breast: Secondary | ICD-10-CM | POA: Diagnosis not present

## 2013-02-21 DIAGNOSIS — Z09 Encounter for follow-up examination after completed treatment for conditions other than malignant neoplasm: Secondary | ICD-10-CM | POA: Diagnosis not present

## 2013-02-21 DIAGNOSIS — T63461A Toxic effect of venom of wasps, accidental (unintentional), initial encounter: Secondary | ICD-10-CM | POA: Diagnosis not present

## 2013-02-28 DIAGNOSIS — Z1211 Encounter for screening for malignant neoplasm of colon: Secondary | ICD-10-CM | POA: Diagnosis not present

## 2013-02-28 DIAGNOSIS — K5730 Diverticulosis of large intestine without perforation or abscess without bleeding: Secondary | ICD-10-CM | POA: Diagnosis not present

## 2013-02-28 DIAGNOSIS — D126 Benign neoplasm of colon, unspecified: Secondary | ICD-10-CM | POA: Diagnosis not present

## 2013-03-24 DIAGNOSIS — T6391XA Toxic effect of contact with unspecified venomous animal, accidental (unintentional), initial encounter: Secondary | ICD-10-CM | POA: Diagnosis not present

## 2013-03-24 DIAGNOSIS — T63461A Toxic effect of venom of wasps, accidental (unintentional), initial encounter: Secondary | ICD-10-CM | POA: Diagnosis not present

## 2013-03-31 ENCOUNTER — Ambulatory Visit (INDEPENDENT_AMBULATORY_CARE_PROVIDER_SITE_OTHER): Payer: Medicare Other | Admitting: Ophthalmology

## 2013-03-31 DIAGNOSIS — H35039 Hypertensive retinopathy, unspecified eye: Secondary | ICD-10-CM | POA: Diagnosis not present

## 2013-03-31 DIAGNOSIS — I10 Essential (primary) hypertension: Secondary | ICD-10-CM | POA: Diagnosis not present

## 2013-03-31 DIAGNOSIS — H35359 Cystoid macular degeneration, unspecified eye: Secondary | ICD-10-CM | POA: Diagnosis not present

## 2013-03-31 DIAGNOSIS — H43819 Vitreous degeneration, unspecified eye: Secondary | ICD-10-CM | POA: Diagnosis not present

## 2013-04-04 DIAGNOSIS — Z23 Encounter for immunization: Secondary | ICD-10-CM | POA: Diagnosis not present

## 2013-04-20 DIAGNOSIS — T63461A Toxic effect of venom of wasps, accidental (unintentional), initial encounter: Secondary | ICD-10-CM | POA: Diagnosis not present

## 2013-04-20 DIAGNOSIS — T6391XA Toxic effect of contact with unspecified venomous animal, accidental (unintentional), initial encounter: Secondary | ICD-10-CM | POA: Diagnosis not present

## 2013-05-26 DIAGNOSIS — T63461A Toxic effect of venom of wasps, accidental (unintentional), initial encounter: Secondary | ICD-10-CM | POA: Diagnosis not present

## 2013-05-26 DIAGNOSIS — T6391XA Toxic effect of contact with unspecified venomous animal, accidental (unintentional), initial encounter: Secondary | ICD-10-CM | POA: Diagnosis not present

## 2013-05-30 DIAGNOSIS — T63461A Toxic effect of venom of wasps, accidental (unintentional), initial encounter: Secondary | ICD-10-CM | POA: Diagnosis not present

## 2013-05-30 DIAGNOSIS — T6391XA Toxic effect of contact with unspecified venomous animal, accidental (unintentional), initial encounter: Secondary | ICD-10-CM | POA: Diagnosis not present

## 2013-06-21 DIAGNOSIS — T63461A Toxic effect of venom of wasps, accidental (unintentional), initial encounter: Secondary | ICD-10-CM | POA: Diagnosis not present

## 2013-06-21 DIAGNOSIS — T6391XA Toxic effect of contact with unspecified venomous animal, accidental (unintentional), initial encounter: Secondary | ICD-10-CM | POA: Diagnosis not present

## 2013-06-27 DIAGNOSIS — N139 Obstructive and reflux uropathy, unspecified: Secondary | ICD-10-CM | POA: Diagnosis not present

## 2013-06-27 DIAGNOSIS — R972 Elevated prostate specific antigen [PSA]: Secondary | ICD-10-CM | POA: Diagnosis not present

## 2013-06-27 DIAGNOSIS — N401 Enlarged prostate with lower urinary tract symptoms: Secondary | ICD-10-CM | POA: Diagnosis not present

## 2013-07-25 DIAGNOSIS — T6391XA Toxic effect of contact with unspecified venomous animal, accidental (unintentional), initial encounter: Secondary | ICD-10-CM | POA: Diagnosis not present

## 2013-07-25 DIAGNOSIS — T63461A Toxic effect of venom of wasps, accidental (unintentional), initial encounter: Secondary | ICD-10-CM | POA: Diagnosis not present

## 2013-08-08 DIAGNOSIS — L719 Rosacea, unspecified: Secondary | ICD-10-CM | POA: Diagnosis not present

## 2013-08-08 DIAGNOSIS — L820 Inflamed seborrheic keratosis: Secondary | ICD-10-CM | POA: Diagnosis not present

## 2013-08-22 DIAGNOSIS — T6391XA Toxic effect of contact with unspecified venomous animal, accidental (unintentional), initial encounter: Secondary | ICD-10-CM | POA: Diagnosis not present

## 2013-08-22 DIAGNOSIS — T63461A Toxic effect of venom of wasps, accidental (unintentional), initial encounter: Secondary | ICD-10-CM | POA: Diagnosis not present

## 2013-08-22 DIAGNOSIS — N63 Unspecified lump in unspecified breast: Secondary | ICD-10-CM | POA: Diagnosis not present

## 2013-09-21 DIAGNOSIS — T63461A Toxic effect of venom of wasps, accidental (unintentional), initial encounter: Secondary | ICD-10-CM | POA: Diagnosis not present

## 2013-09-21 DIAGNOSIS — T6391XA Toxic effect of contact with unspecified venomous animal, accidental (unintentional), initial encounter: Secondary | ICD-10-CM | POA: Diagnosis not present

## 2013-09-28 ENCOUNTER — Ambulatory Visit (INDEPENDENT_AMBULATORY_CARE_PROVIDER_SITE_OTHER): Payer: Medicare Other | Admitting: Ophthalmology

## 2013-09-28 DIAGNOSIS — I10 Essential (primary) hypertension: Secondary | ICD-10-CM | POA: Diagnosis not present

## 2013-09-28 DIAGNOSIS — H43819 Vitreous degeneration, unspecified eye: Secondary | ICD-10-CM | POA: Diagnosis not present

## 2013-09-28 DIAGNOSIS — H35039 Hypertensive retinopathy, unspecified eye: Secondary | ICD-10-CM

## 2013-10-24 DIAGNOSIS — T6391XA Toxic effect of contact with unspecified venomous animal, accidental (unintentional), initial encounter: Secondary | ICD-10-CM | POA: Diagnosis not present

## 2013-10-24 DIAGNOSIS — T63461A Toxic effect of venom of wasps, accidental (unintentional), initial encounter: Secondary | ICD-10-CM | POA: Diagnosis not present

## 2013-11-22 DIAGNOSIS — T63461A Toxic effect of venom of wasps, accidental (unintentional), initial encounter: Secondary | ICD-10-CM | POA: Diagnosis not present

## 2013-11-22 DIAGNOSIS — T6391XA Toxic effect of contact with unspecified venomous animal, accidental (unintentional), initial encounter: Secondary | ICD-10-CM | POA: Diagnosis not present

## 2013-12-27 DIAGNOSIS — R7301 Impaired fasting glucose: Secondary | ICD-10-CM | POA: Diagnosis not present

## 2013-12-27 DIAGNOSIS — Z91038 Other insect allergy status: Secondary | ICD-10-CM | POA: Diagnosis not present

## 2013-12-27 DIAGNOSIS — L719 Rosacea, unspecified: Secondary | ICD-10-CM | POA: Diagnosis not present

## 2013-12-27 DIAGNOSIS — E782 Mixed hyperlipidemia: Secondary | ICD-10-CM | POA: Diagnosis not present

## 2013-12-27 DIAGNOSIS — Z23 Encounter for immunization: Secondary | ICD-10-CM | POA: Diagnosis not present

## 2013-12-27 DIAGNOSIS — R972 Elevated prostate specific antigen [PSA]: Secondary | ICD-10-CM | POA: Diagnosis not present

## 2013-12-27 DIAGNOSIS — Z0000 Encounter for general adult medical examination without abnormal findings: Secondary | ICD-10-CM | POA: Diagnosis not present

## 2013-12-27 DIAGNOSIS — I10 Essential (primary) hypertension: Secondary | ICD-10-CM | POA: Diagnosis not present

## 2014-01-16 DIAGNOSIS — T6391XA Toxic effect of contact with unspecified venomous animal, accidental (unintentional), initial encounter: Secondary | ICD-10-CM | POA: Diagnosis not present

## 2014-01-16 DIAGNOSIS — T63461A Toxic effect of venom of wasps, accidental (unintentional), initial encounter: Secondary | ICD-10-CM | POA: Diagnosis not present

## 2014-02-01 DIAGNOSIS — H35039 Hypertensive retinopathy, unspecified eye: Secondary | ICD-10-CM | POA: Diagnosis not present

## 2014-02-01 DIAGNOSIS — H43819 Vitreous degeneration, unspecified eye: Secondary | ICD-10-CM | POA: Diagnosis not present

## 2014-02-01 DIAGNOSIS — T8529XA Other mechanical complication of intraocular lens, initial encounter: Secondary | ICD-10-CM | POA: Diagnosis not present

## 2014-02-16 DIAGNOSIS — T6391XA Toxic effect of contact with unspecified venomous animal, accidental (unintentional), initial encounter: Secondary | ICD-10-CM | POA: Diagnosis not present

## 2014-02-16 DIAGNOSIS — T63461A Toxic effect of venom of wasps, accidental (unintentional), initial encounter: Secondary | ICD-10-CM | POA: Diagnosis not present

## 2014-02-27 DIAGNOSIS — D1739 Benign lipomatous neoplasm of skin and subcutaneous tissue of other sites: Secondary | ICD-10-CM | POA: Diagnosis not present

## 2014-03-16 DIAGNOSIS — T63461A Toxic effect of venom of wasps, accidental (unintentional), initial encounter: Secondary | ICD-10-CM | POA: Diagnosis not present

## 2014-03-16 DIAGNOSIS — T6391XA Toxic effect of contact with unspecified venomous animal, accidental (unintentional), initial encounter: Secondary | ICD-10-CM | POA: Diagnosis not present

## 2014-04-13 DIAGNOSIS — T63441D Toxic effect of venom of bees, accidental (unintentional), subsequent encounter: Secondary | ICD-10-CM | POA: Diagnosis not present

## 2014-04-13 DIAGNOSIS — T63461D Toxic effect of venom of wasps, accidental (unintentional), subsequent encounter: Secondary | ICD-10-CM | POA: Diagnosis not present

## 2014-04-13 DIAGNOSIS — T63451D Toxic effect of venom of hornets, accidental (unintentional), subsequent encounter: Secondary | ICD-10-CM | POA: Diagnosis not present

## 2014-04-30 DIAGNOSIS — Z23 Encounter for immunization: Secondary | ICD-10-CM | POA: Diagnosis not present

## 2014-05-21 DIAGNOSIS — T63451D Toxic effect of venom of hornets, accidental (unintentional), subsequent encounter: Secondary | ICD-10-CM | POA: Diagnosis not present

## 2014-05-21 DIAGNOSIS — T63461D Toxic effect of venom of wasps, accidental (unintentional), subsequent encounter: Secondary | ICD-10-CM | POA: Diagnosis not present

## 2014-05-21 DIAGNOSIS — T63441D Toxic effect of venom of bees, accidental (unintentional), subsequent encounter: Secondary | ICD-10-CM | POA: Diagnosis not present

## 2014-05-29 DIAGNOSIS — T63441D Toxic effect of venom of bees, accidental (unintentional), subsequent encounter: Secondary | ICD-10-CM | POA: Diagnosis not present

## 2014-05-29 DIAGNOSIS — T63451D Toxic effect of venom of hornets, accidental (unintentional), subsequent encounter: Secondary | ICD-10-CM | POA: Diagnosis not present

## 2014-05-29 DIAGNOSIS — T63461D Toxic effect of venom of wasps, accidental (unintentional), subsequent encounter: Secondary | ICD-10-CM | POA: Diagnosis not present

## 2014-06-21 DIAGNOSIS — T63451D Toxic effect of venom of hornets, accidental (unintentional), subsequent encounter: Secondary | ICD-10-CM | POA: Diagnosis not present

## 2014-06-21 DIAGNOSIS — T63441D Toxic effect of venom of bees, accidental (unintentional), subsequent encounter: Secondary | ICD-10-CM | POA: Diagnosis not present

## 2014-06-21 DIAGNOSIS — T63461D Toxic effect of venom of wasps, accidental (unintentional), subsequent encounter: Secondary | ICD-10-CM | POA: Diagnosis not present

## 2014-06-25 DIAGNOSIS — N410 Acute prostatitis: Secondary | ICD-10-CM | POA: Diagnosis not present

## 2014-06-25 DIAGNOSIS — R3915 Urgency of urination: Secondary | ICD-10-CM | POA: Diagnosis not present

## 2014-06-29 DIAGNOSIS — N401 Enlarged prostate with lower urinary tract symptoms: Secondary | ICD-10-CM | POA: Diagnosis not present

## 2014-06-29 DIAGNOSIS — R3912 Poor urinary stream: Secondary | ICD-10-CM | POA: Diagnosis not present

## 2014-06-29 DIAGNOSIS — R972 Elevated prostate specific antigen [PSA]: Secondary | ICD-10-CM | POA: Diagnosis not present

## 2014-06-29 DIAGNOSIS — N411 Chronic prostatitis: Secondary | ICD-10-CM | POA: Diagnosis not present

## 2014-07-10 DIAGNOSIS — I10 Essential (primary) hypertension: Secondary | ICD-10-CM | POA: Diagnosis not present

## 2014-07-10 DIAGNOSIS — R7301 Impaired fasting glucose: Secondary | ICD-10-CM | POA: Diagnosis not present

## 2014-07-10 DIAGNOSIS — N390 Urinary tract infection, site not specified: Secondary | ICD-10-CM | POA: Diagnosis not present

## 2014-07-10 DIAGNOSIS — E782 Mixed hyperlipidemia: Secondary | ICD-10-CM | POA: Diagnosis not present

## 2014-07-23 DIAGNOSIS — T63451D Toxic effect of venom of hornets, accidental (unintentional), subsequent encounter: Secondary | ICD-10-CM | POA: Diagnosis not present

## 2014-07-23 DIAGNOSIS — T63441D Toxic effect of venom of bees, accidental (unintentional), subsequent encounter: Secondary | ICD-10-CM | POA: Diagnosis not present

## 2014-07-23 DIAGNOSIS — T63461D Toxic effect of venom of wasps, accidental (unintentional), subsequent encounter: Secondary | ICD-10-CM | POA: Diagnosis not present

## 2014-08-10 DIAGNOSIS — N390 Urinary tract infection, site not specified: Secondary | ICD-10-CM | POA: Diagnosis not present

## 2014-08-10 DIAGNOSIS — R972 Elevated prostate specific antigen [PSA]: Secondary | ICD-10-CM | POA: Diagnosis not present

## 2014-08-10 DIAGNOSIS — R3912 Poor urinary stream: Secondary | ICD-10-CM | POA: Diagnosis not present

## 2014-08-24 DIAGNOSIS — T63461D Toxic effect of venom of wasps, accidental (unintentional), subsequent encounter: Secondary | ICD-10-CM | POA: Diagnosis not present

## 2014-08-24 DIAGNOSIS — T63441D Toxic effect of venom of bees, accidental (unintentional), subsequent encounter: Secondary | ICD-10-CM | POA: Diagnosis not present

## 2014-08-24 DIAGNOSIS — T63451D Toxic effect of venom of hornets, accidental (unintentional), subsequent encounter: Secondary | ICD-10-CM | POA: Diagnosis not present

## 2014-09-12 DIAGNOSIS — R972 Elevated prostate specific antigen [PSA]: Secondary | ICD-10-CM | POA: Diagnosis not present

## 2014-09-13 DIAGNOSIS — N63 Unspecified lump in breast: Secondary | ICD-10-CM | POA: Diagnosis not present

## 2014-09-13 DIAGNOSIS — Z09 Encounter for follow-up examination after completed treatment for conditions other than malignant neoplasm: Secondary | ICD-10-CM | POA: Diagnosis not present

## 2014-09-18 DIAGNOSIS — T63461D Toxic effect of venom of wasps, accidental (unintentional), subsequent encounter: Secondary | ICD-10-CM | POA: Diagnosis not present

## 2014-09-18 DIAGNOSIS — T63441D Toxic effect of venom of bees, accidental (unintentional), subsequent encounter: Secondary | ICD-10-CM | POA: Diagnosis not present

## 2014-09-18 DIAGNOSIS — T63451D Toxic effect of venom of hornets, accidental (unintentional), subsequent encounter: Secondary | ICD-10-CM | POA: Diagnosis not present

## 2014-10-02 ENCOUNTER — Ambulatory Visit (INDEPENDENT_AMBULATORY_CARE_PROVIDER_SITE_OTHER): Payer: Medicare Other | Admitting: Ophthalmology

## 2014-10-02 DIAGNOSIS — H35033 Hypertensive retinopathy, bilateral: Secondary | ICD-10-CM

## 2014-10-02 DIAGNOSIS — H43812 Vitreous degeneration, left eye: Secondary | ICD-10-CM | POA: Diagnosis not present

## 2014-10-02 DIAGNOSIS — I10 Essential (primary) hypertension: Secondary | ICD-10-CM | POA: Diagnosis not present

## 2014-10-22 DIAGNOSIS — T63441D Toxic effect of venom of bees, accidental (unintentional), subsequent encounter: Secondary | ICD-10-CM | POA: Diagnosis not present

## 2014-10-22 DIAGNOSIS — T63451D Toxic effect of venom of hornets, accidental (unintentional), subsequent encounter: Secondary | ICD-10-CM | POA: Diagnosis not present

## 2014-10-22 DIAGNOSIS — T63461D Toxic effect of venom of wasps, accidental (unintentional), subsequent encounter: Secondary | ICD-10-CM | POA: Diagnosis not present

## 2014-11-22 DIAGNOSIS — T63451D Toxic effect of venom of hornets, accidental (unintentional), subsequent encounter: Secondary | ICD-10-CM | POA: Diagnosis not present

## 2014-11-22 DIAGNOSIS — T63441D Toxic effect of venom of bees, accidental (unintentional), subsequent encounter: Secondary | ICD-10-CM | POA: Diagnosis not present

## 2014-11-22 DIAGNOSIS — T63461D Toxic effect of venom of wasps, accidental (unintentional), subsequent encounter: Secondary | ICD-10-CM | POA: Diagnosis not present

## 2014-12-13 DIAGNOSIS — N401 Enlarged prostate with lower urinary tract symptoms: Secondary | ICD-10-CM | POA: Diagnosis not present

## 2014-12-17 DIAGNOSIS — N401 Enlarged prostate with lower urinary tract symptoms: Secondary | ICD-10-CM | POA: Diagnosis not present

## 2014-12-17 DIAGNOSIS — N138 Other obstructive and reflux uropathy: Secondary | ICD-10-CM | POA: Diagnosis not present

## 2014-12-17 DIAGNOSIS — R972 Elevated prostate specific antigen [PSA]: Secondary | ICD-10-CM | POA: Diagnosis not present

## 2014-12-17 DIAGNOSIS — R3912 Poor urinary stream: Secondary | ICD-10-CM | POA: Diagnosis not present

## 2014-12-17 DIAGNOSIS — N5201 Erectile dysfunction due to arterial insufficiency: Secondary | ICD-10-CM | POA: Diagnosis not present

## 2014-12-24 DIAGNOSIS — T63441D Toxic effect of venom of bees, accidental (unintentional), subsequent encounter: Secondary | ICD-10-CM | POA: Diagnosis not present

## 2014-12-24 DIAGNOSIS — T63461D Toxic effect of venom of wasps, accidental (unintentional), subsequent encounter: Secondary | ICD-10-CM | POA: Diagnosis not present

## 2014-12-24 DIAGNOSIS — T63451D Toxic effect of venom of hornets, accidental (unintentional), subsequent encounter: Secondary | ICD-10-CM | POA: Diagnosis not present

## 2015-01-22 DIAGNOSIS — T63451D Toxic effect of venom of hornets, accidental (unintentional), subsequent encounter: Secondary | ICD-10-CM | POA: Diagnosis not present

## 2015-01-22 DIAGNOSIS — T63461D Toxic effect of venom of wasps, accidental (unintentional), subsequent encounter: Secondary | ICD-10-CM | POA: Diagnosis not present

## 2015-01-22 DIAGNOSIS — T63441D Toxic effect of venom of bees, accidental (unintentional), subsequent encounter: Secondary | ICD-10-CM | POA: Diagnosis not present

## 2015-02-27 DIAGNOSIS — T63451D Toxic effect of venom of hornets, accidental (unintentional), subsequent encounter: Secondary | ICD-10-CM | POA: Diagnosis not present

## 2015-02-27 DIAGNOSIS — D126 Benign neoplasm of colon, unspecified: Secondary | ICD-10-CM | POA: Diagnosis not present

## 2015-02-27 DIAGNOSIS — T63441D Toxic effect of venom of bees, accidental (unintentional), subsequent encounter: Secondary | ICD-10-CM | POA: Diagnosis not present

## 2015-02-27 DIAGNOSIS — E782 Mixed hyperlipidemia: Secondary | ICD-10-CM | POA: Diagnosis not present

## 2015-02-27 DIAGNOSIS — R7301 Impaired fasting glucose: Secondary | ICD-10-CM | POA: Diagnosis not present

## 2015-02-27 DIAGNOSIS — Z0000 Encounter for general adult medical examination without abnormal findings: Secondary | ICD-10-CM | POA: Diagnosis not present

## 2015-02-27 DIAGNOSIS — I10 Essential (primary) hypertension: Secondary | ICD-10-CM | POA: Diagnosis not present

## 2015-02-27 DIAGNOSIS — N189 Chronic kidney disease, unspecified: Secondary | ICD-10-CM | POA: Diagnosis not present

## 2015-02-27 DIAGNOSIS — T63461D Toxic effect of venom of wasps, accidental (unintentional), subsequent encounter: Secondary | ICD-10-CM | POA: Diagnosis not present

## 2015-03-20 DIAGNOSIS — N63 Unspecified lump in breast: Secondary | ICD-10-CM | POA: Diagnosis not present

## 2015-03-22 DIAGNOSIS — L570 Actinic keratosis: Secondary | ICD-10-CM | POA: Diagnosis not present

## 2015-03-22 DIAGNOSIS — L219 Seborrheic dermatitis, unspecified: Secondary | ICD-10-CM | POA: Diagnosis not present

## 2015-04-02 DIAGNOSIS — T63441D Toxic effect of venom of bees, accidental (unintentional), subsequent encounter: Secondary | ICD-10-CM | POA: Diagnosis not present

## 2015-04-02 DIAGNOSIS — T63451D Toxic effect of venom of hornets, accidental (unintentional), subsequent encounter: Secondary | ICD-10-CM | POA: Diagnosis not present

## 2015-04-02 DIAGNOSIS — T63461D Toxic effect of venom of wasps, accidental (unintentional), subsequent encounter: Secondary | ICD-10-CM | POA: Diagnosis not present

## 2015-04-16 DIAGNOSIS — L814 Other melanin hyperpigmentation: Secondary | ICD-10-CM | POA: Diagnosis not present

## 2015-04-16 DIAGNOSIS — D1801 Hemangioma of skin and subcutaneous tissue: Secondary | ICD-10-CM | POA: Diagnosis not present

## 2015-04-16 DIAGNOSIS — L918 Other hypertrophic disorders of the skin: Secondary | ICD-10-CM | POA: Diagnosis not present

## 2015-04-16 DIAGNOSIS — L719 Rosacea, unspecified: Secondary | ICD-10-CM | POA: Diagnosis not present

## 2015-04-16 DIAGNOSIS — Z23 Encounter for immunization: Secondary | ICD-10-CM | POA: Diagnosis not present

## 2015-04-16 DIAGNOSIS — L821 Other seborrheic keratosis: Secondary | ICD-10-CM | POA: Diagnosis not present

## 2015-04-16 DIAGNOSIS — D229 Melanocytic nevi, unspecified: Secondary | ICD-10-CM | POA: Diagnosis not present

## 2015-04-16 DIAGNOSIS — D485 Neoplasm of uncertain behavior of skin: Secondary | ICD-10-CM | POA: Diagnosis not present

## 2015-04-17 DIAGNOSIS — L820 Inflamed seborrheic keratosis: Secondary | ICD-10-CM | POA: Diagnosis not present

## 2015-04-17 DIAGNOSIS — L918 Other hypertrophic disorders of the skin: Secondary | ICD-10-CM | POA: Diagnosis not present

## 2015-05-06 DIAGNOSIS — T63461D Toxic effect of venom of wasps, accidental (unintentional), subsequent encounter: Secondary | ICD-10-CM | POA: Diagnosis not present

## 2015-05-06 DIAGNOSIS — T63451D Toxic effect of venom of hornets, accidental (unintentional), subsequent encounter: Secondary | ICD-10-CM | POA: Diagnosis not present

## 2015-05-24 DIAGNOSIS — M546 Pain in thoracic spine: Secondary | ICD-10-CM | POA: Diagnosis not present

## 2015-05-31 DIAGNOSIS — T63451D Toxic effect of venom of hornets, accidental (unintentional), subsequent encounter: Secondary | ICD-10-CM | POA: Diagnosis not present

## 2015-05-31 DIAGNOSIS — T63461D Toxic effect of venom of wasps, accidental (unintentional), subsequent encounter: Secondary | ICD-10-CM | POA: Diagnosis not present

## 2015-05-31 DIAGNOSIS — T63441D Toxic effect of venom of bees, accidental (unintentional), subsequent encounter: Secondary | ICD-10-CM | POA: Diagnosis not present

## 2015-06-05 DIAGNOSIS — T63441D Toxic effect of venom of bees, accidental (unintentional), subsequent encounter: Secondary | ICD-10-CM | POA: Diagnosis not present

## 2015-07-01 DIAGNOSIS — T63461D Toxic effect of venom of wasps, accidental (unintentional), subsequent encounter: Secondary | ICD-10-CM | POA: Diagnosis not present

## 2015-07-01 DIAGNOSIS — T63451D Toxic effect of venom of hornets, accidental (unintentional), subsequent encounter: Secondary | ICD-10-CM | POA: Diagnosis not present

## 2015-07-01 DIAGNOSIS — T63441D Toxic effect of venom of bees, accidental (unintentional), subsequent encounter: Secondary | ICD-10-CM | POA: Diagnosis not present

## 2015-07-30 DIAGNOSIS — T63451D Toxic effect of venom of hornets, accidental (unintentional), subsequent encounter: Secondary | ICD-10-CM | POA: Diagnosis not present

## 2015-07-30 DIAGNOSIS — T63461D Toxic effect of venom of wasps, accidental (unintentional), subsequent encounter: Secondary | ICD-10-CM | POA: Diagnosis not present

## 2015-09-03 DIAGNOSIS — T63451D Toxic effect of venom of hornets, accidental (unintentional), subsequent encounter: Secondary | ICD-10-CM | POA: Diagnosis not present

## 2015-09-03 DIAGNOSIS — T63461D Toxic effect of venom of wasps, accidental (unintentional), subsequent encounter: Secondary | ICD-10-CM | POA: Diagnosis not present

## 2015-10-18 DIAGNOSIS — T63451D Toxic effect of venom of hornets, accidental (unintentional), subsequent encounter: Secondary | ICD-10-CM | POA: Diagnosis not present

## 2015-10-18 DIAGNOSIS — T63461D Toxic effect of venom of wasps, accidental (unintentional), subsequent encounter: Secondary | ICD-10-CM | POA: Diagnosis not present

## 2015-11-06 DIAGNOSIS — Z961 Presence of intraocular lens: Secondary | ICD-10-CM | POA: Diagnosis not present

## 2015-11-06 DIAGNOSIS — H33301 Unspecified retinal break, right eye: Secondary | ICD-10-CM | POA: Diagnosis not present

## 2015-11-06 DIAGNOSIS — H5203 Hypermetropia, bilateral: Secondary | ICD-10-CM | POA: Diagnosis not present

## 2015-11-06 DIAGNOSIS — H21531 Iridodialysis, right eye: Secondary | ICD-10-CM | POA: Diagnosis not present

## 2015-11-06 DIAGNOSIS — H43813 Vitreous degeneration, bilateral: Secondary | ICD-10-CM | POA: Diagnosis not present

## 2015-11-06 DIAGNOSIS — H35431 Paving stone degeneration of retina, right eye: Secondary | ICD-10-CM | POA: Diagnosis not present

## 2015-11-06 DIAGNOSIS — H43819 Vitreous degeneration, unspecified eye: Secondary | ICD-10-CM | POA: Diagnosis not present

## 2015-11-06 DIAGNOSIS — H52223 Regular astigmatism, bilateral: Secondary | ICD-10-CM | POA: Diagnosis not present

## 2015-11-06 DIAGNOSIS — Z9849 Cataract extraction status, unspecified eye: Secondary | ICD-10-CM | POA: Diagnosis not present

## 2015-11-15 DIAGNOSIS — T63451D Toxic effect of venom of hornets, accidental (unintentional), subsequent encounter: Secondary | ICD-10-CM | POA: Diagnosis not present

## 2015-11-15 DIAGNOSIS — T63461D Toxic effect of venom of wasps, accidental (unintentional), subsequent encounter: Secondary | ICD-10-CM | POA: Diagnosis not present

## 2015-12-16 DIAGNOSIS — R972 Elevated prostate specific antigen [PSA]: Secondary | ICD-10-CM | POA: Diagnosis not present

## 2015-12-23 DIAGNOSIS — N411 Chronic prostatitis: Secondary | ICD-10-CM | POA: Diagnosis not present

## 2015-12-23 DIAGNOSIS — N401 Enlarged prostate with lower urinary tract symptoms: Secondary | ICD-10-CM | POA: Diagnosis not present

## 2015-12-23 DIAGNOSIS — R972 Elevated prostate specific antigen [PSA]: Secondary | ICD-10-CM | POA: Diagnosis not present

## 2015-12-27 DIAGNOSIS — T63451D Toxic effect of venom of hornets, accidental (unintentional), subsequent encounter: Secondary | ICD-10-CM | POA: Diagnosis not present

## 2015-12-27 DIAGNOSIS — T63461D Toxic effect of venom of wasps, accidental (unintentional), subsequent encounter: Secondary | ICD-10-CM | POA: Diagnosis not present

## 2016-01-29 DIAGNOSIS — T63451D Toxic effect of venom of hornets, accidental (unintentional), subsequent encounter: Secondary | ICD-10-CM | POA: Diagnosis not present

## 2016-01-29 DIAGNOSIS — T63461D Toxic effect of venom of wasps, accidental (unintentional), subsequent encounter: Secondary | ICD-10-CM | POA: Diagnosis not present

## 2016-02-25 DIAGNOSIS — T63451D Toxic effect of venom of hornets, accidental (unintentional), subsequent encounter: Secondary | ICD-10-CM | POA: Diagnosis not present

## 2016-02-25 DIAGNOSIS — T63461D Toxic effect of venom of wasps, accidental (unintentional), subsequent encounter: Secondary | ICD-10-CM | POA: Diagnosis not present

## 2016-03-13 DIAGNOSIS — Z23 Encounter for immunization: Secondary | ICD-10-CM | POA: Diagnosis not present

## 2016-03-13 DIAGNOSIS — M6530 Trigger finger, unspecified finger: Secondary | ICD-10-CM | POA: Diagnosis not present

## 2016-03-13 DIAGNOSIS — R972 Elevated prostate specific antigen [PSA]: Secondary | ICD-10-CM | POA: Diagnosis not present

## 2016-03-13 DIAGNOSIS — G4700 Insomnia, unspecified: Secondary | ICD-10-CM | POA: Diagnosis not present

## 2016-03-13 DIAGNOSIS — I10 Essential (primary) hypertension: Secondary | ICD-10-CM | POA: Diagnosis not present

## 2016-03-13 DIAGNOSIS — E782 Mixed hyperlipidemia: Secondary | ICD-10-CM | POA: Diagnosis not present

## 2016-03-13 DIAGNOSIS — Z86010 Personal history of colonic polyps: Secondary | ICD-10-CM | POA: Diagnosis not present

## 2016-03-13 DIAGNOSIS — N189 Chronic kidney disease, unspecified: Secondary | ICD-10-CM | POA: Diagnosis not present

## 2016-03-13 DIAGNOSIS — Z0000 Encounter for general adult medical examination without abnormal findings: Secondary | ICD-10-CM | POA: Diagnosis not present

## 2016-03-13 DIAGNOSIS — R7301 Impaired fasting glucose: Secondary | ICD-10-CM | POA: Diagnosis not present

## 2016-03-27 DIAGNOSIS — T63451D Toxic effect of venom of hornets, accidental (unintentional), subsequent encounter: Secondary | ICD-10-CM | POA: Diagnosis not present

## 2016-03-27 DIAGNOSIS — T63461D Toxic effect of venom of wasps, accidental (unintentional), subsequent encounter: Secondary | ICD-10-CM | POA: Diagnosis not present

## 2016-03-27 DIAGNOSIS — T63441D Toxic effect of venom of bees, accidental (unintentional), subsequent encounter: Secondary | ICD-10-CM | POA: Diagnosis not present

## 2016-04-03 DIAGNOSIS — T63441D Toxic effect of venom of bees, accidental (unintentional), subsequent encounter: Secondary | ICD-10-CM | POA: Diagnosis not present

## 2016-04-10 DIAGNOSIS — T63441D Toxic effect of venom of bees, accidental (unintentional), subsequent encounter: Secondary | ICD-10-CM | POA: Diagnosis not present

## 2016-04-17 DIAGNOSIS — T63451D Toxic effect of venom of hornets, accidental (unintentional), subsequent encounter: Secondary | ICD-10-CM | POA: Diagnosis not present

## 2016-04-17 DIAGNOSIS — T63461D Toxic effect of venom of wasps, accidental (unintentional), subsequent encounter: Secondary | ICD-10-CM | POA: Diagnosis not present

## 2016-04-17 DIAGNOSIS — T63441D Toxic effect of venom of bees, accidental (unintentional), subsequent encounter: Secondary | ICD-10-CM | POA: Diagnosis not present

## 2016-04-27 DIAGNOSIS — T63441D Toxic effect of venom of bees, accidental (unintentional), subsequent encounter: Secondary | ICD-10-CM | POA: Diagnosis not present

## 2016-04-27 DIAGNOSIS — T63461D Toxic effect of venom of wasps, accidental (unintentional), subsequent encounter: Secondary | ICD-10-CM | POA: Diagnosis not present

## 2016-04-27 DIAGNOSIS — T63451D Toxic effect of venom of hornets, accidental (unintentional), subsequent encounter: Secondary | ICD-10-CM | POA: Diagnosis not present

## 2016-05-06 DIAGNOSIS — T63461D Toxic effect of venom of wasps, accidental (unintentional), subsequent encounter: Secondary | ICD-10-CM | POA: Diagnosis not present

## 2016-05-06 DIAGNOSIS — T63451D Toxic effect of venom of hornets, accidental (unintentional), subsequent encounter: Secondary | ICD-10-CM | POA: Diagnosis not present

## 2016-05-06 DIAGNOSIS — T63441D Toxic effect of venom of bees, accidental (unintentional), subsequent encounter: Secondary | ICD-10-CM | POA: Diagnosis not present

## 2016-05-11 DIAGNOSIS — T63441D Toxic effect of venom of bees, accidental (unintentional), subsequent encounter: Secondary | ICD-10-CM | POA: Diagnosis not present

## 2016-05-14 DIAGNOSIS — M65331 Trigger finger, right middle finger: Secondary | ICD-10-CM | POA: Diagnosis not present

## 2016-05-18 DIAGNOSIS — L570 Actinic keratosis: Secondary | ICD-10-CM | POA: Diagnosis not present

## 2016-05-18 DIAGNOSIS — B009 Herpesviral infection, unspecified: Secondary | ICD-10-CM | POA: Diagnosis not present

## 2016-05-18 DIAGNOSIS — D1801 Hemangioma of skin and subcutaneous tissue: Secondary | ICD-10-CM | POA: Diagnosis not present

## 2016-05-18 DIAGNOSIS — L568 Other specified acute skin changes due to ultraviolet radiation: Secondary | ICD-10-CM | POA: Diagnosis not present

## 2016-05-18 DIAGNOSIS — Z23 Encounter for immunization: Secondary | ICD-10-CM | POA: Diagnosis not present

## 2016-05-18 DIAGNOSIS — T63441D Toxic effect of venom of bees, accidental (unintentional), subsequent encounter: Secondary | ICD-10-CM | POA: Diagnosis not present

## 2016-05-18 DIAGNOSIS — T63451D Toxic effect of venom of hornets, accidental (unintentional), subsequent encounter: Secondary | ICD-10-CM | POA: Diagnosis not present

## 2016-05-18 DIAGNOSIS — D239 Other benign neoplasm of skin, unspecified: Secondary | ICD-10-CM | POA: Diagnosis not present

## 2016-05-18 DIAGNOSIS — L814 Other melanin hyperpigmentation: Secondary | ICD-10-CM | POA: Diagnosis not present

## 2016-05-18 DIAGNOSIS — T63461D Toxic effect of venom of wasps, accidental (unintentional), subsequent encounter: Secondary | ICD-10-CM | POA: Diagnosis not present

## 2016-05-18 DIAGNOSIS — L821 Other seborrheic keratosis: Secondary | ICD-10-CM | POA: Diagnosis not present

## 2016-05-25 DIAGNOSIS — T63441D Toxic effect of venom of bees, accidental (unintentional), subsequent encounter: Secondary | ICD-10-CM | POA: Diagnosis not present

## 2016-06-18 DIAGNOSIS — T63461D Toxic effect of venom of wasps, accidental (unintentional), subsequent encounter: Secondary | ICD-10-CM | POA: Diagnosis not present

## 2016-06-18 DIAGNOSIS — T63451D Toxic effect of venom of hornets, accidental (unintentional), subsequent encounter: Secondary | ICD-10-CM | POA: Diagnosis not present

## 2016-06-18 DIAGNOSIS — T63441D Toxic effect of venom of bees, accidental (unintentional), subsequent encounter: Secondary | ICD-10-CM | POA: Diagnosis not present

## 2016-07-17 DIAGNOSIS — T63441D Toxic effect of venom of bees, accidental (unintentional), subsequent encounter: Secondary | ICD-10-CM | POA: Diagnosis not present

## 2016-07-17 DIAGNOSIS — T63461D Toxic effect of venom of wasps, accidental (unintentional), subsequent encounter: Secondary | ICD-10-CM | POA: Diagnosis not present

## 2016-07-17 DIAGNOSIS — T63451D Toxic effect of venom of hornets, accidental (unintentional), subsequent encounter: Secondary | ICD-10-CM | POA: Diagnosis not present

## 2016-08-18 DIAGNOSIS — T63451D Toxic effect of venom of hornets, accidental (unintentional), subsequent encounter: Secondary | ICD-10-CM | POA: Diagnosis not present

## 2016-08-18 DIAGNOSIS — T63441D Toxic effect of venom of bees, accidental (unintentional), subsequent encounter: Secondary | ICD-10-CM | POA: Diagnosis not present

## 2016-08-31 DIAGNOSIS — H00024 Hordeolum internum left upper eyelid: Secondary | ICD-10-CM | POA: Diagnosis not present

## 2016-09-22 DIAGNOSIS — T63461D Toxic effect of venom of wasps, accidental (unintentional), subsequent encounter: Secondary | ICD-10-CM | POA: Diagnosis not present

## 2016-09-22 DIAGNOSIS — T63451D Toxic effect of venom of hornets, accidental (unintentional), subsequent encounter: Secondary | ICD-10-CM | POA: Diagnosis not present

## 2016-09-22 DIAGNOSIS — T63441D Toxic effect of venom of bees, accidental (unintentional), subsequent encounter: Secondary | ICD-10-CM | POA: Diagnosis not present

## 2016-10-20 DIAGNOSIS — T63441D Toxic effect of venom of bees, accidental (unintentional), subsequent encounter: Secondary | ICD-10-CM | POA: Diagnosis not present

## 2016-10-20 DIAGNOSIS — T63461D Toxic effect of venom of wasps, accidental (unintentional), subsequent encounter: Secondary | ICD-10-CM | POA: Diagnosis not present

## 2016-10-20 DIAGNOSIS — T63451D Toxic effect of venom of hornets, accidental (unintentional), subsequent encounter: Secondary | ICD-10-CM | POA: Diagnosis not present

## 2016-11-20 DIAGNOSIS — T63461D Toxic effect of venom of wasps, accidental (unintentional), subsequent encounter: Secondary | ICD-10-CM | POA: Diagnosis not present

## 2016-11-20 DIAGNOSIS — T63451D Toxic effect of venom of hornets, accidental (unintentional), subsequent encounter: Secondary | ICD-10-CM | POA: Diagnosis not present

## 2016-11-20 DIAGNOSIS — T63441D Toxic effect of venom of bees, accidental (unintentional), subsequent encounter: Secondary | ICD-10-CM | POA: Diagnosis not present

## 2016-12-08 DIAGNOSIS — C61 Malignant neoplasm of prostate: Secondary | ICD-10-CM | POA: Diagnosis not present

## 2016-12-14 DIAGNOSIS — R972 Elevated prostate specific antigen [PSA]: Secondary | ICD-10-CM | POA: Diagnosis not present

## 2016-12-14 DIAGNOSIS — N400 Enlarged prostate without lower urinary tract symptoms: Secondary | ICD-10-CM | POA: Diagnosis not present

## 2016-12-14 DIAGNOSIS — N411 Chronic prostatitis: Secondary | ICD-10-CM | POA: Diagnosis not present

## 2016-12-23 DIAGNOSIS — T63461D Toxic effect of venom of wasps, accidental (unintentional), subsequent encounter: Secondary | ICD-10-CM | POA: Diagnosis not present

## 2016-12-23 DIAGNOSIS — T63441D Toxic effect of venom of bees, accidental (unintentional), subsequent encounter: Secondary | ICD-10-CM | POA: Diagnosis not present

## 2016-12-23 DIAGNOSIS — T63451D Toxic effect of venom of hornets, accidental (unintentional), subsequent encounter: Secondary | ICD-10-CM | POA: Diagnosis not present

## 2017-01-21 DIAGNOSIS — T63441D Toxic effect of venom of bees, accidental (unintentional), subsequent encounter: Secondary | ICD-10-CM | POA: Diagnosis not present

## 2017-01-21 DIAGNOSIS — T63461D Toxic effect of venom of wasps, accidental (unintentional), subsequent encounter: Secondary | ICD-10-CM | POA: Diagnosis not present

## 2017-01-21 DIAGNOSIS — T63451D Toxic effect of venom of hornets, accidental (unintentional), subsequent encounter: Secondary | ICD-10-CM | POA: Diagnosis not present

## 2017-02-09 DIAGNOSIS — T63461D Toxic effect of venom of wasps, accidental (unintentional), subsequent encounter: Secondary | ICD-10-CM | POA: Diagnosis not present

## 2017-02-09 DIAGNOSIS — T63441D Toxic effect of venom of bees, accidental (unintentional), subsequent encounter: Secondary | ICD-10-CM | POA: Diagnosis not present

## 2017-02-09 DIAGNOSIS — T63451D Toxic effect of venom of hornets, accidental (unintentional), subsequent encounter: Secondary | ICD-10-CM | POA: Diagnosis not present

## 2017-02-16 DIAGNOSIS — T63461D Toxic effect of venom of wasps, accidental (unintentional), subsequent encounter: Secondary | ICD-10-CM | POA: Diagnosis not present

## 2017-02-16 DIAGNOSIS — T63451D Toxic effect of venom of hornets, accidental (unintentional), subsequent encounter: Secondary | ICD-10-CM | POA: Diagnosis not present

## 2017-02-16 DIAGNOSIS — T63441D Toxic effect of venom of bees, accidental (unintentional), subsequent encounter: Secondary | ICD-10-CM | POA: Diagnosis not present

## 2017-02-23 DIAGNOSIS — T63441D Toxic effect of venom of bees, accidental (unintentional), subsequent encounter: Secondary | ICD-10-CM | POA: Diagnosis not present

## 2017-02-23 DIAGNOSIS — T63451D Toxic effect of venom of hornets, accidental (unintentional), subsequent encounter: Secondary | ICD-10-CM | POA: Diagnosis not present

## 2017-02-23 DIAGNOSIS — T63461D Toxic effect of venom of wasps, accidental (unintentional), subsequent encounter: Secondary | ICD-10-CM | POA: Diagnosis not present

## 2017-03-02 DIAGNOSIS — T63451D Toxic effect of venom of hornets, accidental (unintentional), subsequent encounter: Secondary | ICD-10-CM | POA: Diagnosis not present

## 2017-03-02 DIAGNOSIS — T63461D Toxic effect of venom of wasps, accidental (unintentional), subsequent encounter: Secondary | ICD-10-CM | POA: Diagnosis not present

## 2017-03-02 DIAGNOSIS — T63441D Toxic effect of venom of bees, accidental (unintentional), subsequent encounter: Secondary | ICD-10-CM | POA: Diagnosis not present

## 2017-03-09 DIAGNOSIS — T63461D Toxic effect of venom of wasps, accidental (unintentional), subsequent encounter: Secondary | ICD-10-CM | POA: Diagnosis not present

## 2017-03-09 DIAGNOSIS — T63451D Toxic effect of venom of hornets, accidental (unintentional), subsequent encounter: Secondary | ICD-10-CM | POA: Diagnosis not present

## 2017-03-09 DIAGNOSIS — T63441D Toxic effect of venom of bees, accidental (unintentional), subsequent encounter: Secondary | ICD-10-CM | POA: Diagnosis not present

## 2017-03-16 DIAGNOSIS — T63451D Toxic effect of venom of hornets, accidental (unintentional), subsequent encounter: Secondary | ICD-10-CM | POA: Diagnosis not present

## 2017-03-16 DIAGNOSIS — T63461D Toxic effect of venom of wasps, accidental (unintentional), subsequent encounter: Secondary | ICD-10-CM | POA: Diagnosis not present

## 2017-03-16 DIAGNOSIS — T63441D Toxic effect of venom of bees, accidental (unintentional), subsequent encounter: Secondary | ICD-10-CM | POA: Diagnosis not present

## 2017-03-23 DIAGNOSIS — T63441D Toxic effect of venom of bees, accidental (unintentional), subsequent encounter: Secondary | ICD-10-CM | POA: Diagnosis not present

## 2017-03-30 DIAGNOSIS — T63441D Toxic effect of venom of bees, accidental (unintentional), subsequent encounter: Secondary | ICD-10-CM | POA: Diagnosis not present

## 2017-04-01 DIAGNOSIS — Z91030 Bee allergy status: Secondary | ICD-10-CM | POA: Diagnosis not present

## 2017-04-01 DIAGNOSIS — E782 Mixed hyperlipidemia: Secondary | ICD-10-CM | POA: Diagnosis not present

## 2017-04-01 DIAGNOSIS — E663 Overweight: Secondary | ICD-10-CM | POA: Diagnosis not present

## 2017-04-01 DIAGNOSIS — Z86010 Personal history of colonic polyps: Secondary | ICD-10-CM | POA: Diagnosis not present

## 2017-04-01 DIAGNOSIS — I10 Essential (primary) hypertension: Secondary | ICD-10-CM | POA: Diagnosis not present

## 2017-04-01 DIAGNOSIS — Z23 Encounter for immunization: Secondary | ICD-10-CM | POA: Diagnosis not present

## 2017-04-01 DIAGNOSIS — R7301 Impaired fasting glucose: Secondary | ICD-10-CM | POA: Diagnosis not present

## 2017-04-01 DIAGNOSIS — R972 Elevated prostate specific antigen [PSA]: Secondary | ICD-10-CM | POA: Diagnosis not present

## 2017-04-01 DIAGNOSIS — Z0000 Encounter for general adult medical examination without abnormal findings: Secondary | ICD-10-CM | POA: Diagnosis not present

## 2017-04-01 DIAGNOSIS — Z6829 Body mass index (BMI) 29.0-29.9, adult: Secondary | ICD-10-CM | POA: Diagnosis not present

## 2017-04-01 DIAGNOSIS — N189 Chronic kidney disease, unspecified: Secondary | ICD-10-CM | POA: Diagnosis not present

## 2017-04-12 DIAGNOSIS — T63441D Toxic effect of venom of bees, accidental (unintentional), subsequent encounter: Secondary | ICD-10-CM | POA: Diagnosis not present

## 2017-04-12 DIAGNOSIS — T63451D Toxic effect of venom of hornets, accidental (unintentional), subsequent encounter: Secondary | ICD-10-CM | POA: Diagnosis not present

## 2017-04-12 DIAGNOSIS — T63461D Toxic effect of venom of wasps, accidental (unintentional), subsequent encounter: Secondary | ICD-10-CM | POA: Diagnosis not present

## 2017-04-19 DIAGNOSIS — T63441D Toxic effect of venom of bees, accidental (unintentional), subsequent encounter: Secondary | ICD-10-CM | POA: Diagnosis not present

## 2017-04-28 DIAGNOSIS — T63441D Toxic effect of venom of bees, accidental (unintentional), subsequent encounter: Secondary | ICD-10-CM | POA: Diagnosis not present

## 2017-04-28 DIAGNOSIS — T63461D Toxic effect of venom of wasps, accidental (unintentional), subsequent encounter: Secondary | ICD-10-CM | POA: Diagnosis not present

## 2017-04-28 DIAGNOSIS — T63451D Toxic effect of venom of hornets, accidental (unintentional), subsequent encounter: Secondary | ICD-10-CM | POA: Diagnosis not present

## 2017-05-17 DIAGNOSIS — T63461D Toxic effect of venom of wasps, accidental (unintentional), subsequent encounter: Secondary | ICD-10-CM | POA: Diagnosis not present

## 2017-05-17 DIAGNOSIS — T63451D Toxic effect of venom of hornets, accidental (unintentional), subsequent encounter: Secondary | ICD-10-CM | POA: Diagnosis not present

## 2017-05-17 DIAGNOSIS — T63441D Toxic effect of venom of bees, accidental (unintentional), subsequent encounter: Secondary | ICD-10-CM | POA: Diagnosis not present

## 2017-06-17 DIAGNOSIS — T63441D Toxic effect of venom of bees, accidental (unintentional), subsequent encounter: Secondary | ICD-10-CM | POA: Diagnosis not present

## 2017-06-17 DIAGNOSIS — T63461D Toxic effect of venom of wasps, accidental (unintentional), subsequent encounter: Secondary | ICD-10-CM | POA: Diagnosis not present

## 2017-06-17 DIAGNOSIS — T63451D Toxic effect of venom of hornets, accidental (unintentional), subsequent encounter: Secondary | ICD-10-CM | POA: Diagnosis not present

## 2017-07-16 DIAGNOSIS — Z961 Presence of intraocular lens: Secondary | ICD-10-CM | POA: Diagnosis not present

## 2017-07-16 DIAGNOSIS — H524 Presbyopia: Secondary | ICD-10-CM | POA: Diagnosis not present

## 2017-07-16 DIAGNOSIS — H52223 Regular astigmatism, bilateral: Secondary | ICD-10-CM | POA: Diagnosis not present

## 2017-07-16 DIAGNOSIS — H5202 Hypermetropia, left eye: Secondary | ICD-10-CM | POA: Diagnosis not present

## 2017-07-16 DIAGNOSIS — H5211 Myopia, right eye: Secondary | ICD-10-CM | POA: Diagnosis not present

## 2017-07-16 DIAGNOSIS — T63441D Toxic effect of venom of bees, accidental (unintentional), subsequent encounter: Secondary | ICD-10-CM | POA: Diagnosis not present

## 2017-07-16 DIAGNOSIS — I10 Essential (primary) hypertension: Secondary | ICD-10-CM | POA: Diagnosis not present

## 2017-07-16 DIAGNOSIS — T63461D Toxic effect of venom of wasps, accidental (unintentional), subsequent encounter: Secondary | ICD-10-CM | POA: Diagnosis not present

## 2017-07-16 DIAGNOSIS — H35033 Hypertensive retinopathy, bilateral: Secondary | ICD-10-CM | POA: Diagnosis not present

## 2017-07-16 DIAGNOSIS — T63451D Toxic effect of venom of hornets, accidental (unintentional), subsequent encounter: Secondary | ICD-10-CM | POA: Diagnosis not present

## 2017-07-16 DIAGNOSIS — T8522XD Displacement of intraocular lens, subsequent encounter: Secondary | ICD-10-CM | POA: Diagnosis not present

## 2017-08-09 DIAGNOSIS — Z23 Encounter for immunization: Secondary | ICD-10-CM | POA: Diagnosis not present

## 2017-08-09 DIAGNOSIS — L603 Nail dystrophy: Secondary | ICD-10-CM | POA: Diagnosis not present

## 2017-08-09 DIAGNOSIS — L821 Other seborrheic keratosis: Secondary | ICD-10-CM | POA: Diagnosis not present

## 2017-08-09 DIAGNOSIS — I781 Nevus, non-neoplastic: Secondary | ICD-10-CM | POA: Diagnosis not present

## 2017-08-09 DIAGNOSIS — D2372 Other benign neoplasm of skin of left lower limb, including hip: Secondary | ICD-10-CM | POA: Diagnosis not present

## 2017-08-09 DIAGNOSIS — L719 Rosacea, unspecified: Secondary | ICD-10-CM | POA: Diagnosis not present

## 2017-08-09 DIAGNOSIS — L219 Seborrheic dermatitis, unspecified: Secondary | ICD-10-CM | POA: Diagnosis not present

## 2017-08-16 DIAGNOSIS — T63441D Toxic effect of venom of bees, accidental (unintentional), subsequent encounter: Secondary | ICD-10-CM | POA: Diagnosis not present

## 2017-08-16 DIAGNOSIS — T63451D Toxic effect of venom of hornets, accidental (unintentional), subsequent encounter: Secondary | ICD-10-CM | POA: Diagnosis not present

## 2017-08-16 DIAGNOSIS — T63461D Toxic effect of venom of wasps, accidental (unintentional), subsequent encounter: Secondary | ICD-10-CM | POA: Diagnosis not present

## 2017-09-13 DIAGNOSIS — T63461D Toxic effect of venom of wasps, accidental (unintentional), subsequent encounter: Secondary | ICD-10-CM | POA: Diagnosis not present

## 2017-09-13 DIAGNOSIS — T63451D Toxic effect of venom of hornets, accidental (unintentional), subsequent encounter: Secondary | ICD-10-CM | POA: Diagnosis not present

## 2017-09-13 DIAGNOSIS — T63441D Toxic effect of venom of bees, accidental (unintentional), subsequent encounter: Secondary | ICD-10-CM | POA: Diagnosis not present

## 2017-10-15 DIAGNOSIS — T63441D Toxic effect of venom of bees, accidental (unintentional), subsequent encounter: Secondary | ICD-10-CM | POA: Diagnosis not present

## 2017-10-15 DIAGNOSIS — T63461D Toxic effect of venom of wasps, accidental (unintentional), subsequent encounter: Secondary | ICD-10-CM | POA: Diagnosis not present

## 2017-10-15 DIAGNOSIS — T63451D Toxic effect of venom of hornets, accidental (unintentional), subsequent encounter: Secondary | ICD-10-CM | POA: Diagnosis not present

## 2017-11-17 DIAGNOSIS — T63441D Toxic effect of venom of bees, accidental (unintentional), subsequent encounter: Secondary | ICD-10-CM | POA: Diagnosis not present

## 2017-11-17 DIAGNOSIS — T63451D Toxic effect of venom of hornets, accidental (unintentional), subsequent encounter: Secondary | ICD-10-CM | POA: Diagnosis not present

## 2017-11-17 DIAGNOSIS — T63461D Toxic effect of venom of wasps, accidental (unintentional), subsequent encounter: Secondary | ICD-10-CM | POA: Diagnosis not present

## 2017-12-14 DIAGNOSIS — T63441D Toxic effect of venom of bees, accidental (unintentional), subsequent encounter: Secondary | ICD-10-CM | POA: Diagnosis not present

## 2017-12-14 DIAGNOSIS — T63451D Toxic effect of venom of hornets, accidental (unintentional), subsequent encounter: Secondary | ICD-10-CM | POA: Diagnosis not present

## 2017-12-14 DIAGNOSIS — T63461D Toxic effect of venom of wasps, accidental (unintentional), subsequent encounter: Secondary | ICD-10-CM | POA: Diagnosis not present

## 2017-12-16 DIAGNOSIS — R21 Rash and other nonspecific skin eruption: Secondary | ICD-10-CM | POA: Diagnosis not present

## 2017-12-16 DIAGNOSIS — W57XXXA Bitten or stung by nonvenomous insect and other nonvenomous arthropods, initial encounter: Secondary | ICD-10-CM | POA: Diagnosis not present

## 2018-01-04 DIAGNOSIS — I499 Cardiac arrhythmia, unspecified: Secondary | ICD-10-CM | POA: Diagnosis not present

## 2018-01-04 DIAGNOSIS — I4891 Unspecified atrial fibrillation: Secondary | ICD-10-CM | POA: Diagnosis not present

## 2018-01-04 DIAGNOSIS — I10 Essential (primary) hypertension: Secondary | ICD-10-CM | POA: Diagnosis not present

## 2018-01-04 DIAGNOSIS — T7840XA Allergy, unspecified, initial encounter: Secondary | ICD-10-CM | POA: Diagnosis not present

## 2018-01-04 DIAGNOSIS — R600 Localized edema: Secondary | ICD-10-CM | POA: Diagnosis not present

## 2018-01-05 ENCOUNTER — Encounter: Payer: Self-pay | Admitting: Cardiology

## 2018-01-05 ENCOUNTER — Ambulatory Visit (INDEPENDENT_AMBULATORY_CARE_PROVIDER_SITE_OTHER): Payer: Medicare Other | Admitting: Cardiology

## 2018-01-05 VITALS — BP 128/84 | HR 117 | Ht 72.0 in | Wt 193.8 lb

## 2018-01-05 DIAGNOSIS — R0602 Shortness of breath: Secondary | ICD-10-CM | POA: Diagnosis not present

## 2018-01-05 DIAGNOSIS — I10 Essential (primary) hypertension: Secondary | ICD-10-CM | POA: Diagnosis not present

## 2018-01-05 DIAGNOSIS — I480 Paroxysmal atrial fibrillation: Secondary | ICD-10-CM | POA: Diagnosis not present

## 2018-01-05 HISTORY — DX: Essential (primary) hypertension: I10

## 2018-01-05 MED ORDER — METOPROLOL SUCCINATE ER 25 MG PO TB24: 25.0000 mg | ORAL_TABLET | Freq: Every day | ORAL | 3 refills | Status: DC

## 2018-01-05 NOTE — Progress Notes (Signed)
Cardiology Office Note    Date:  01/05/2018   ID:  MAZIAH Cobb, DOB February 28, 1946, MRN [id number]  PCP:  Hulan Fess, MD  Cardiologist:  Fransico Him, MD   Chief Complaint  Patient presents with  . Atrial Fibrillation  . Hypertension    History of Present Illness:  John Cobb is a 72 y.o. male who is being seen today for the evaluation of new onset atrial fibrillation at the request of Little, Lennette Bihari, MD.  This is a very pleasant 72 year old male with a history of hyperlipidemia, hypertension and newly diagnosed atrial fibrillation.  He was seen by his PCP on 01/04/2018 and was complaining that he has been having problems with shortness of breath that has been going on for about a month.  Seem to have started around the time he started mowing his yard.  He also developed a dry cough after mowing as well.  He has been taking some Claritin which has helped some.  He has had some PND associated with his cough at night.  He says the cough seems to be worse at night.  He is a Academic librarian and has not had any difficulty in swimming though he has not been swimming recently because he is been traveling a lot.  He denies any chest pain or pressure.  He has not had any exertional fatigue.  On exam he was noted to have an irregular rhythm and EKG showed atrial fibrillation at 97 bpm.  He was started on Eliquis 5 mg twice daily and Toprol-XL 25 mg daily.  His amlodipine he was on was stopped.  He is now referred for further evaluation.  He does have a very strong family history of coronary disease and early age with his father dying in his 62s of a massive MI.  And he used to be a remote smoker as well.  He is here today for followup and is doing well.  He denies any chest pain or pressure,  orthopnea, LE edema, dizziness, palpitations or syncope.  He does admit to having a cough since mowing his yard last time as well as shortness of breath since mowing his yard last time although he is able to still  continue swimming without any change in his breathing pattern.  He is compliant with his meds and is tolerating meds with no SE.   Past Medical History:  Diagnosis Date  . Arthritis   . Blood transfusion    As an infant  . High cholesterol   . Hypertension   . Pneumonia ~ 1978    Past Surgical History:  Procedure Laterality Date  . ANTERIOR CERVICAL DISCECTOMY  2008  . APPENDECTOMY     "as an infant"  . CATARACT EXTRACTION W/ INTRAOCULAR LENS IMPLANT  08/13/11   left  . CATARACT EXTRACTION W/ INTRAOCULAR LENS IMPLANT  09/03/11   right  . Colonscopy    . Compound Fractures  06/1980   right arm; repaired with 2 plates and 10 pins.  . FRACTURE SURGERY    . GAS/FLUID EXCHANGE  10/20/2011   Procedure: GAS/FLUID EXCHANGE;  Surgeon: Hayden Pedro, MD;  Location: [place];  Service: Ophthalmology;  Laterality: Right;  . HERNIA REPAIR     "umbilical; as an infant"  . INTRAOCULAR LENS EXCHANGE  10/20/11   right eye  . PARS PLANA VITRECTOMY  10/20/2011   Procedure: PARS PLANA VITRECTOMY WITH 25G REMOVAL/SUTURE INTRAOCULAR LENS;  Surgeon: Hayden Pedro, MD;  Location: [place];  Service: Ophthalmology;  Laterality: Right;    Current Medications: Current Meds  Medication Sig  . Apixaban (ELIQUIS PO) Take 5 mg by mouth daily.   . fish oil-omega-3 fatty acids 1000 MG capsule Take 2 g by mouth daily.  Marland Kitchen losartan (COZAAR) 100 MG tablet Take 100 mg by mouth daily.  . Multiple Vitamin (MULITIVITAMIN WITH MINERALS) TABS Take 1 tablet by mouth daily.  . nicotine polacrilex (NICORETTE) 4 MG gum Take 4 mg by mouth.  . NON FORMULARY Allergy shot sq for bees monthly  . pravastatin (PRAVACHOL) 80 MG tablet Take 80 mg by mouth daily.  . tamsulosin (FLOMAX) 0.4 MG CAPS capsule Take 0.4 mg by mouth daily.    Allergies:   Patient has no known allergies.   Social History   Socioeconomic History  . Marital status: Married    Spouse name: Not on file  . Number of children: Not on file  . Years of  education: Not on file  . Highest education level: Not on file  Occupational History  . Not on file  Social Needs  . Financial resource strain: Not on file  . Food insecurity:    Worry: Not on file    Inability: Not on file  . Transportation needs:    Medical: Not on file    Non-medical: Not on file  Tobacco Use  . Smoking status: Never Smoker  . Smokeless tobacco: Former Systems developer    Types: Chew  Substance and Sexual Activity  . Alcohol use: No  . Drug use: No  . Sexual activity: Not Currently  Lifestyle  . Physical activity:    Days per week: Not on file    Minutes per session: Not on file  . Stress: Not on file  Relationships  . Social connections:    Talks on phone: Not on file    Gets together: Not on file    Attends religious service: Not on file    Active member of club or organization: Not on file    Attends meetings of clubs or organizations: Not on file    Relationship status: Not on file  Other Topics Concern  . Not on file  Social History Narrative  . Not on file     Family History:  The patient's family history includes Heart attack in his father; Lung cancer in his mother; Stomach cancer in his mother.   ROS:   Please see the history of present illness.    ROS All other systems reviewed and are negative.  No flowsheet data found.  PHYSICAL EXAM:   VS:  BP 128/84   Pulse (!) 117   Ht 6' (1.829 m)   Wt 193 lb 12.8 oz (87.9 kg)   SpO2 98%   BMI 26.28 kg/m    GEN: Well nourished, well developed, in no acute distress  HEENT: normal  Neck: no JVD, carotid bruits, or masses Cardiac: Irregularly irregular and tachycardic; no murmurs, rubs, or gallops,no edema.  Intact distal pulses bilaterally.  Respiratory:  clear to auscultation bilaterally, normal work of breathing GI: soft, nontender, nondistended, + BS MS: no deformity or atrophy  Skin: warm and dry, no rash Neuro:  Alert and Oriented x 3, Strength and sensation are intact Psych: euthymic mood,  full affect  Wt Readings from Last 3 Encounters:  01/05/18 193 lb 12.8 oz (87.9 kg)  10/21/11 210 lb (95.3 kg)      Studies/Labs Reviewed:   EKG:  EKG is ordered today.  The ekg ordered today demonstrates atrial fibrillation with RVR at 117 bpm.  Recent Labs: No results found for requested labs within last 8760 hours.   Lipid Panel No results found for: CHOL, TRIG, HDL, CHOLHDL, VLDL, LDLCALC, LDLDIRECT  Additional studies/ records that were reviewed today include:  Office notes from PCP 01/04/2018    ASSESSMENT:    1. Paroxysmal atrial fibrillation (HCC)   2. Benign essential HTN   3. SOB (shortness of breath)      PLAN:  In order of problems listed above:  1.  New onset atrial fibrillation with CVR.  He was supposed to start Toprol-XL 25 mg daily yesterday but says the prescription was never called in by his PCP.  I have recommended that he start Toprol XL 25 mg daily as his heart rate is elevated today.  He is now on Eliquis 5 mg twice daily for a CHADS2VASC score of 2.  I will check a 2D echocardiogram to assess LV function and left atrial size.  Once he has been on Eliquis for 4 weeks we will bring him back for EKG and if he is still in atrial fibrillation we will set up cardioversion outpatient.  2.  Hypertension - BP is well controlled on current medications.  He will continue on Toprol-XL 25 mg daily and Micardis 80 mg daily.  3.  Shortness of breath - I suspect this is related to his atrial fibrillation but he is also had a chronic cough and is also had weight loss of more than 20 pounds since March.  I am going to get a chest x-ray to make sure there is no lung mass.  I will get a 2D echocardiogram to assess LV function and left atrial size.  I will also get a Lexiscan Myoview to rule out ischemia given his family history of CAD and early age.    Medication Adjustments/Labs and Tests Ordered: Current medicines are reviewed at length with the patient today.   Concerns regarding medicines are outlined above.  Medication changes, Labs and Tests ordered today are listed in the Patient Instructions below.  Patient Instructions  Medication Instructions:  Your physician recommends that you continue on your current medications as directed. Please refer to the Current Medication list given to you today.  If you need a refill on your cardiac medications, please contact your pharmacy first.  Labwork: None ordered   Testing/Procedures: Your physician has requested that you have an echocardiogram. Echocardiography is a painless test that uses sound waves to create images of your heart. It provides your doctor with information about the size and shape of your heart and how well your heart's chambers and valves are working. This procedure takes approximately one hour. There are no restrictions for this procedure.  Your physician has requested that you have a lexiscan myoview. For further information please visit [email or web address]. Please follow instruction sheet, as given.  A chest x-ray takes a picture of the organs and structures inside the chest, including the heart, lungs, and blood vessels. This test can show several things, including, whether the heart is enlarges; whether fluid is building up in the lungs; and whether pacemaker / defibrillator leads are still in place.  Follow-Up: Your physician recommends that you schedule a follow-up appointment in: 4 weeks with Dr. Radford Pax or PA on Dr. Theodosia Blender team   Any Other Special Instructions Will Be Listed Below (If Applicable).   Thank you for choosing [facility]    Reeves Dam, RN  [phone]  If you need a refill on your cardiac medications before your next appointment, please call your pharmacy.      Signed, Fransico Him, MD  01/05/2018 11:25 AM    [address], [place], [place]  [address] Phone: [phone]; Fax: [phone]

## 2018-01-05 NOTE — Patient Instructions (Addendum)
Medication Instructions:  Your physician has recommended you make the following change in your medication: START: Metoprolol 25 mg (1 tablet) once a day   If you need a refill on your cardiac medications, please contact your pharmacy first.  Labwork: None ordered   Testing/Procedures: Your physician has requested that you have an echocardiogram. Echocardiography is a painless test that uses sound waves to create images of your heart. It provides your doctor with information about the size and shape of your heart and how well your heart's chambers and valves are working. This procedure takes approximately one hour. There are no restrictions for this procedure.  Your physician has requested that you have a lexiscan myoview. For further information please visit [email or web address]. Please follow instruction sheet, as given.  A chest x-ray takes a picture of the organs and structures inside the chest, including the heart, lungs, and blood vessels. This test can show several things, including, whether the heart is enlarges; whether fluid is building up in the lungs; and whether pacemaker / defibrillator leads are still in place.  [facility]  [address] [place], [place] [address]  Follow-Up: Your physician recommends that you schedule a follow-up appointment in: 4 weeks with Dr. Radford Pax or PA on Dr. Theodosia Blender team   Any Other Special Instructions Will Be Listed Below (If Applicable). Metoprolol extended-release tablets What is this medicine? METOPROLOL (me TOE proe lole) is a beta-blocker. Beta-blockers reduce the workload on the heart and help it to beat more regularly. This medicine is used to treat high blood pressure and to prevent chest pain. It is also used to after a heart attack and to prevent an additional heart attack from occurring. This medicine may be used for other purposes; ask your health care provider or pharmacist if you have questions. COMMON BRAND NAME(S):  toprol, Toprol XL What should I tell my health care provider before I take this medicine? They need to know if you have any of these conditions: -diabetes -heart or vessel disease like slow heart rate, worsening heart failure, heart block, sick sinus syndrome or Raynaud's disease -kidney disease -liver disease -lung or breathing disease, like asthma or emphysema -pheochromocytoma -thyroid disease -an unusual or allergic reaction to metoprolol, other beta-blockers, medicines, foods, dyes, or preservatives -pregnant or trying to get pregnant -breast-feeding How should I use this medicine? Take this medicine by mouth with a glass of water. Follow the directions on the prescription label. Do not crush or chew. Take this medicine with or immediately after meals. Take your doses at regular intervals. Do not take more medicine than directed. Do not stop taking this medicine suddenly. This could lead to serious heart-related effects. Talk to your pediatrician regarding the use of this medicine in children. While this drug may be prescribed for children as young as 6 years for selected conditions, precautions do apply. Overdosage: If you think you have taken too much of this medicine contact a poison control center or emergency room at once. NOTE: This medicine is only for you. Do not share this medicine with others. What if I miss a dose? If you miss a dose, take it as soon as you can. If it is almost time for your next dose, take only that dose. Do not take double or extra doses. What may interact with this medicine? This medicine may interact with the following medications: -certain medicines for blood pressure, heart disease, irregular heart beat -certain medicines for depression, like monoamine oxidase (MAO) inhibitors, fluoxetine, or paroxetine -  clonidine -dobutamine -epinephrine -isoproterenol -reserpine This list may not describe all possible interactions. Give your health care provider a  list of all the medicines, herbs, non-prescription drugs, or dietary supplements you use. Also tell them if you smoke, drink alcohol, or use illegal drugs. Some items may interact with your medicine. What should I watch for while using this medicine? Visit your doctor or health care professional for regular check ups. Contact your doctor right away if your symptoms worsen. Check your blood pressure and pulse rate regularly. Ask your health care professional what your blood pressure and pulse rate should be, and when you should contact them. You may get drowsy or dizzy. Do not drive, use machinery, or do anything that needs mental alertness until you know how this medicine affects you. Do not sit or stand up quickly, especially if you are an older patient. This reduces the risk of dizzy or fainting spells. Contact your doctor if these symptoms continue. Alcohol may interfere with the effect of this medicine. Avoid alcoholic drinks. What side effects may I notice from receiving this medicine? Side effects that you should report to your doctor or health care professional as soon as possible: -allergic reactions like skin rash, itching or hives -cold or numb hands or feet -depression -difficulty breathing -faint -fever with sore throat -irregular heartbeat, chest pain -rapid weight gain -swollen legs or ankles Side effects that usually do not require medical attention (report to your doctor or health care professional if they continue or are bothersome): -anxiety or nervousness -change in sex drive or performance -dry skin -headache -nightmares or trouble sleeping -short term memory loss -stomach upset or diarrhea -unusually tired This list may not describe all possible side effects. Call your doctor for medical advice about side effects. You may report side effects to FDA at 1-800-FDA-1088. Where should I keep my medicine? Keep out of the reach of children. Store at room temperature between  15 and 30 degrees C (59 and 86 degrees F). Throw away any unused medicine after the expiration date. NOTE: This sheet is a summary. It may not cover all possible information. If you have questions about this medicine, talk to your doctor, pharmacist, or health care provider.  2018 Elsevier/Gold Standard (2013-03-03 14:41:37)   Thank you for choosing [facility], RN  [phone]  If you need a refill on your cardiac medications before your next appointment, please call your pharmacy.

## 2018-01-05 NOTE — H&P (View-Only) (Signed)
Cardiology Office Note    Date:  01/05/2018   ID:  TERRYN REDNER, DOB 01-12-1946, MRN [id number]  PCP:  Hulan Fess, MD  Cardiologist:  Fransico Him, MD   Chief Complaint  Patient presents with  . Atrial Fibrillation  . Hypertension    History of Present Illness:  John Cobb is a 72 y.o. male who is being seen today for the evaluation of new onset atrial fibrillation at the request of Little, Lennette Bihari, MD.  This is a very pleasant 72 year old male with a history of hyperlipidemia, hypertension and newly diagnosed atrial fibrillation.  He was seen by his PCP on 01/04/2018 and was complaining that he has been having problems with shortness of breath that has been going on for about a month.  Seem to have started around the time he started mowing his yard.  He also developed a dry cough after mowing as well.  He has been taking some Claritin which has helped some.  He has had some PND associated with his cough at night.  He says the cough seems to be worse at night.  He is a Academic librarian and has not had any difficulty in swimming though he has not been swimming recently because he is been traveling a lot.  He denies any chest pain or pressure.  He has not had any exertional fatigue.  On exam he was noted to have an irregular rhythm and EKG showed atrial fibrillation at 97 bpm.  He was started on Eliquis 5 mg twice daily and Toprol-XL 25 mg daily.  His amlodipine he was on was stopped.  He is now referred for further evaluation.  He does have a very strong family history of coronary disease and early age with his father dying in his 76s of a massive MI.  And he used to be a remote smoker as well.  He is here today for followup and is doing well.  He denies any chest pain or pressure,  orthopnea, LE edema, dizziness, palpitations or syncope.  He does admit to having a cough since mowing his yard last time as well as shortness of breath since mowing his yard last time although he is able to still  continue swimming without any change in his breathing pattern.  He is compliant with his meds and is tolerating meds with no SE.   Past Medical History:  Diagnosis Date  . Arthritis   . Blood transfusion    As an infant  . High cholesterol   . Hypertension   . Pneumonia ~ 1978    Past Surgical History:  Procedure Laterality Date  . ANTERIOR CERVICAL DISCECTOMY  2008  . APPENDECTOMY     "as an infant"  . CATARACT EXTRACTION W/ INTRAOCULAR LENS IMPLANT  08/13/11   left  . CATARACT EXTRACTION W/ INTRAOCULAR LENS IMPLANT  09/03/11   right  . Colonscopy    . Compound Fractures  06/1980   right arm; repaired with 2 plates and 10 pins.  . FRACTURE SURGERY    . GAS/FLUID EXCHANGE  10/20/2011   Procedure: GAS/FLUID EXCHANGE;  Surgeon: Hayden Pedro, MD;  Location: [facility];  Service: Ophthalmology;  Laterality: Right;  . HERNIA REPAIR     "umbilical; as an infant"  . INTRAOCULAR LENS EXCHANGE  10/20/11   right eye  . PARS PLANA VITRECTOMY  10/20/2011   Procedure: PARS PLANA VITRECTOMY WITH 25G REMOVAL/SUTURE INTRAOCULAR LENS;  Surgeon: Hayden Pedro, MD;  Location: [facility];  Service: Ophthalmology;  Laterality: Right;    Current Medications: Current Meds  Medication Sig  . Apixaban (ELIQUIS PO) Take 5 mg by mouth daily.   . fish oil-omega-3 fatty acids 1000 MG capsule Take 2 g by mouth daily.  Marland Kitchen losartan (COZAAR) 100 MG tablet Take 100 mg by mouth daily.  . Multiple Vitamin (MULITIVITAMIN WITH MINERALS) TABS Take 1 tablet by mouth daily.  . nicotine polacrilex (NICORETTE) 4 MG gum Take 4 mg by mouth.  . NON FORMULARY Allergy shot sq for bees monthly  . pravastatin (PRAVACHOL) 80 MG tablet Take 80 mg by mouth daily.  . tamsulosin (FLOMAX) 0.4 MG CAPS capsule Take 0.4 mg by mouth daily.    Allergies:   Patient has no known allergies.   Social History   Socioeconomic History  . Marital status: Married    Spouse name: Not on file  . Number of children: Not on file  . Years of  education: Not on file  . Highest education level: Not on file  Occupational History  . Not on file  Social Needs  . Financial resource strain: Not on file  . Food insecurity:    Worry: Not on file    Inability: Not on file  . Transportation needs:    Medical: Not on file    Non-medical: Not on file  Tobacco Use  . Smoking status: Never Smoker  . Smokeless tobacco: Former Systems developer    Types: Chew  Substance and Sexual Activity  . Alcohol use: No  . Drug use: No  . Sexual activity: Not Currently  Lifestyle  . Physical activity:    Days per week: Not on file    Minutes per session: Not on file  . Stress: Not on file  Relationships  . Social connections:    Talks on phone: Not on file    Gets together: Not on file    Attends religious service: Not on file    Active member of club or organization: Not on file    Attends meetings of clubs or organizations: Not on file    Relationship status: Not on file  Other Topics Concern  . Not on file  Social History Narrative  . Not on file     Family History:  The patient's family history includes Heart attack in his father; Lung cancer in his mother; Stomach cancer in his mother.   ROS:   Please see the history of present illness.    ROS All other systems reviewed and are negative.  No flowsheet data found.  PHYSICAL EXAM:   VS:  BP 128/84   Pulse (!) 117   Ht 6' (1.829 m)   Wt 193 lb 12.8 oz (87.9 kg)   SpO2 98%   BMI 26.28 kg/m    GEN: Well nourished, well developed, in no acute distress  HEENT: normal  Neck: no JVD, carotid bruits, or masses Cardiac: Irregularly irregular and tachycardic; no murmurs, rubs, or gallops,no edema.  Intact distal pulses bilaterally.  Respiratory:  clear to auscultation bilaterally, normal work of breathing GI: soft, nontender, nondistended, + BS MS: no deformity or atrophy  Skin: warm and dry, no rash Neuro:  Alert and Oriented x 3, Strength and sensation are intact Psych: euthymic mood,  full affect  Wt Readings from Last 3 Encounters:  01/05/18 193 lb 12.8 oz (87.9 kg)  10/21/11 210 lb (95.3 kg)      Studies/Labs Reviewed:   EKG:  EKG is ordered today.  The ekg ordered today demonstrates atrial fibrillation with RVR at 117 bpm.  Recent Labs: No results found for requested labs within last 8760 hours.   Lipid Panel No results found for: CHOL, TRIG, HDL, CHOLHDL, VLDL, LDLCALC, LDLDIRECT  Additional studies/ records that were reviewed today include:  Office notes from PCP 01/04/2018    ASSESSMENT:    1. Paroxysmal atrial fibrillation (HCC)   2. Benign essential HTN   3. SOB (shortness of breath)      PLAN:  In order of problems listed above:  1.  New onset atrial fibrillation with CVR.  He was supposed to start Toprol-XL 25 mg daily yesterday but says the prescription was never called in by his PCP.  I have recommended that he start Toprol XL 25 mg daily as his heart rate is elevated today.  He is now on Eliquis 5 mg twice daily for a CHADS2VASC score of 2.  I will check a 2D echocardiogram to assess LV function and left atrial size.  Once he has been on Eliquis for 4 weeks we will bring him back for EKG and if he is still in atrial fibrillation we will set up cardioversion outpatient.  2.  Hypertension - BP is well controlled on current medications.  He will continue on Toprol-XL 25 mg daily and Micardis 80 mg daily.  3.  Shortness of breath - I suspect this is related to his atrial fibrillation but he is also had a chronic cough and is also had weight loss of more than 20 pounds since March.  I am going to get a chest x-ray to make sure there is no lung mass.  I will get a 2D echocardiogram to assess LV function and left atrial size.  I will also get a Lexiscan Myoview to rule out ischemia given his family history of CAD and early age.    Medication Adjustments/Labs and Tests Ordered: Current medicines are reviewed at length with the patient today.   Concerns regarding medicines are outlined above.  Medication changes, Labs and Tests ordered today are listed in the Patient Instructions below.  Patient Instructions  Medication Instructions:  Your physician recommends that you continue on your current medications as directed. Please refer to the Current Medication list given to you today.  If you need a refill on your cardiac medications, please contact your pharmacy first.  Labwork: None ordered   Testing/Procedures: Your physician has requested that you have an echocardiogram. Echocardiography is a painless test that uses sound waves to create images of your heart. It provides your doctor with information about the size and shape of your heart and how well your heart's chambers and valves are working. This procedure takes approximately one hour. There are no restrictions for this procedure.  Your physician has requested that you have a lexiscan myoview. For further information please visit HugeFiesta.[email or web address]. Please follow instruction sheet, as given.  A chest x-ray takes a picture of the organs and structures inside the chest, including the heart, lungs, and blood vessels. This test can show several things, including, whether the heart is enlarges; whether fluid is building up in the lungs; and whether pacemaker / defibrillator leads are still in place.  Follow-Up: Your physician recommends that you schedule a follow-up appointment in: 4 weeks with Dr. Radford Pax or PA on Dr. Theodosia Blender team   Any Other Special Instructions Will Be Listed Below (If Applicable).   Thank you for choosing [facility]    Reeves [place], RN  [phone]  If you need a refill on your cardiac medications before your next appointment, please call your pharmacy.      Signed, Fransico Him, MD  01/05/2018 11:25 AM    [address], [place], [place]  [address] Phone: [phone]; Fax: [phone]

## 2018-01-06 NOTE — Addendum Note (Signed)
Addended by: Patterson Hammersmith A on: 01/06/2018 09:55 AM   Modules accepted: Orders

## 2018-01-07 ENCOUNTER — Ambulatory Visit
Admission: RE | Admit: 2018-01-07 | Discharge: 2018-01-07 | Disposition: A | Discharge status: Home / Self Care | Payer: Medicare Other | Source: Ambulatory Visit | Attending: Cardiology | Admitting: Cardiology

## 2018-01-07 DIAGNOSIS — R0602 Shortness of breath: Secondary | ICD-10-CM

## 2018-01-07 DIAGNOSIS — R05 Cough: Secondary | ICD-10-CM | POA: Diagnosis not present

## 2018-01-10 ENCOUNTER — Telehealth: Payer: Self-pay | Admitting: Cardiology

## 2018-01-10 DIAGNOSIS — R0602 Shortness of breath: Secondary | ICD-10-CM

## 2018-01-10 NOTE — Telephone Encounter (Signed)
Please add a BMET onto BNP today and further recs pending results of labs today

## 2018-01-10 NOTE — Telephone Encounter (Signed)
Pt made aware of chest x-ray results. He is having significant sob that is audible over the phone. He states he feels about the same as he did during his office visit. He is scheduled for BNP tomorrow and echo and stress test on Monday 01/17/18. I informed him that I will make Dr. Radford Pax aware and call back if she has any additional recommendations. I encouraged pt to keep his upcoming appts. He stated understanding and thankful for the call    Notes recorded by Sueanne Margarita, MD on 01/07/2018 at 10:45 PM EDT Chest xray showed mild atelectasis with small bilateral pleural effusions - please have patient come in for BNP

## 2018-01-10 NOTE — Addendum Note (Signed)
Addended by: Teressa Senter on: 01/10/2018 04:09 PM   Modules accepted: Orders

## 2018-01-10 NOTE — Telephone Encounter (Signed)
New Message    Patient is returning call in reference to chest xray results. Please call,

## 2018-01-10 NOTE — Telephone Encounter (Signed)
BMET order placed. To be drawn tomorrow.

## 2018-01-11 ENCOUNTER — Other Ambulatory Visit: Payer: Medicare Other | Admitting: *Deleted

## 2018-01-11 ENCOUNTER — Telehealth (HOSPITAL_COMMUNITY): Payer: Self-pay | Admitting: *Deleted

## 2018-01-11 DIAGNOSIS — R0602 Shortness of breath: Secondary | ICD-10-CM

## 2018-01-11 NOTE — Telephone Encounter (Signed)
Patient given detailed instructions per Myocardial Perfusion Study Information Sheet for the test on 01/17/18 at 0715. Patient notified to arrive 15 minutes early and that it is imperative to arrive on time for appointment to keep from having the test rescheduled.  If you need to cancel or reschedule your appointment, please call the office within 24 hours of your appointment. . Patient verbalized understanding.John Cobb, Ranae Palms

## 2018-01-12 LAB — BASIC METABOLIC PANEL
BUN / CREAT RATIO: 12 (ref 10–24)
BUN: 18 mg/dL (ref 8–27)
CHLORIDE: 102 mmol/L (ref 96–106)
CO2: 22 mmol/L (ref 20–29)
Calcium: 9.3 mg/dL (ref 8.6–10.2)
Creatinine, Ser: 1.48 mg/dL — ABNORMAL HIGH (ref 0.76–1.27)
GFR calc Af Amer: 55 mL/min/{1.73_m2} — ABNORMAL LOW (ref >59)
GFR calc non Af Amer: 47 mL/min/{1.73_m2} — ABNORMAL LOW (ref >59)
GLUCOSE: 147 mg/dL — AB (ref 65–99)
Potassium: 4 mmol/L (ref 3.5–5.2)
SODIUM: 142 mmol/L (ref 134–144)

## 2018-01-12 LAB — BRAIN NATRIURETIC PEPTIDE: BNP: 1014.7 pg/mL — AB (ref 0.0–100.0)

## 2018-01-14 ENCOUNTER — Other Ambulatory Visit: Payer: Self-pay | Admitting: Cardiology

## 2018-01-14 ENCOUNTER — Telehealth: Payer: Self-pay

## 2018-01-14 DIAGNOSIS — Z79899 Other long term (current) drug therapy: Secondary | ICD-10-CM

## 2018-01-14 DIAGNOSIS — R7989 Other specified abnormal findings of blood chemistry: Secondary | ICD-10-CM

## 2018-01-14 MED ORDER — FUROSEMIDE 40 MG PO TABS: 40.0000 mg | ORAL_TABLET | Freq: Every day | ORAL | 0 refills | Status: DC

## 2018-01-14 NOTE — Telephone Encounter (Signed)
-----   Message from Dorothy Spark, MD sent at 01/12/2018 11:00 PM EDT ----- He has elevated BNP, shortness of breath, and bilateral pleural effusions, I would start Lasix 40 mg daily and follow-up with a PA checkup in 2 weeks and repeated BMP and BMP at that time.

## 2018-01-14 NOTE — Telephone Encounter (Signed)
Notes recorded by Teressa Senter, RN on 01/14/2018 at 8:36 AM EDT Pt made aware of lab results. Pt instucted to Start Lasix 40 mg once a day and keep f/u appt with Estella Husk. PA on 02/02/18 and will add repeat BMP and BNP for the same day. Pt verbalized understanding and thankful for the call

## 2018-01-17 ENCOUNTER — Other Ambulatory Visit: Payer: Self-pay

## 2018-01-17 ENCOUNTER — Ambulatory Visit (HOSPITAL_COMMUNITY): Payer: Medicare Other | Attending: Internal Medicine

## 2018-01-17 ENCOUNTER — Ambulatory Visit (HOSPITAL_BASED_OUTPATIENT_CLINIC_OR_DEPARTMENT_OTHER): Payer: Medicare Other

## 2018-01-17 DIAGNOSIS — I10 Essential (primary) hypertension: Secondary | ICD-10-CM | POA: Insufficient documentation

## 2018-01-17 DIAGNOSIS — R9439 Abnormal result of other cardiovascular function study: Secondary | ICD-10-CM | POA: Diagnosis not present

## 2018-01-17 DIAGNOSIS — Z87891 Personal history of nicotine dependence: Secondary | ICD-10-CM | POA: Insufficient documentation

## 2018-01-17 DIAGNOSIS — R0602 Shortness of breath: Secondary | ICD-10-CM | POA: Diagnosis not present

## 2018-01-17 DIAGNOSIS — Z8249 Family history of ischemic heart disease and other diseases of the circulatory system: Secondary | ICD-10-CM | POA: Insufficient documentation

## 2018-01-17 DIAGNOSIS — I4891 Unspecified atrial fibrillation: Secondary | ICD-10-CM | POA: Insufficient documentation

## 2018-01-17 DIAGNOSIS — R0600 Dyspnea, unspecified: Secondary | ICD-10-CM | POA: Diagnosis not present

## 2018-01-17 DIAGNOSIS — E785 Hyperlipidemia, unspecified: Secondary | ICD-10-CM | POA: Insufficient documentation

## 2018-01-17 LAB — MYOCARDIAL PERFUSION IMAGING
CHL CUP NUCLEAR SSS: 7
LV sys vol: 131 mL
LVDIAVOL: 162 mL (ref 62–150)
Peak HR: 121 {beats}/min
RATE: 0.38
Rest HR: 96 {beats}/min
SDS: 3
SRS: 6
TID: 1.06

## 2018-01-17 MED ORDER — TECHNETIUM TC 99M TETROFOSMIN IV KIT
31.3000 | PACK | Freq: Once | INTRAVENOUS | Status: AC | PRN
  Administered 2018-01-17: 31.3 via INTRAVENOUS
  Filled 2018-01-17: qty 32

## 2018-01-17 MED ORDER — TECHNETIUM TC 99M TETROFOSMIN IV KIT
10.6000 | PACK | Freq: Once | INTRAVENOUS | Status: AC | PRN
  Administered 2018-01-17: 10.6 via INTRAVENOUS
  Filled 2018-01-17: qty 11

## 2018-01-17 MED ORDER — REGADENOSON 0.4 MG/5ML IV SOLN
0.4000 mg | Freq: Once | INTRAVENOUS | Status: AC
Start: 2018-01-17 — End: 2018-01-17
  Administered 2018-01-17: 0.4 mg via INTRAVENOUS

## 2018-01-18 ENCOUNTER — Telehealth: Payer: Self-pay | Admitting: *Deleted

## 2018-01-18 ENCOUNTER — Other Ambulatory Visit: Payer: Self-pay | Admitting: Cardiology

## 2018-01-18 DIAGNOSIS — R9439 Abnormal result of other cardiovascular function study: Secondary | ICD-10-CM

## 2018-01-18 NOTE — Telephone Encounter (Signed)
Pt contacted pre-catheterization scheduled at [facility] for: Thursday January 20, 2018 1:30 PM Verified arrival time and place: [place] Entr[facility] A at: 11:30 AM  No solid food after midnight prior to cath, clear liquids until 5 AM day of procedure.  Hold: Apixaban -01/18/18 until post procedure Losartan 01/19/18 and 01/20/18 Furosemide -01/19/18 and 01/20/18  AM meds can be  taken pre-cath with sip of water including: ASA 81 mg  Confirmed patient has responsible person to drive home post procedure and for 24 hours after you arrive home:yes

## 2018-01-18 NOTE — Progress Notes (Signed)
Instructions reviewed with patient. Patient verbalized understanding and thankful for the call.

## 2018-01-20 ENCOUNTER — Ambulatory Visit (HOSPITAL_COMMUNITY)
Admission: RE | Admit: 2018-01-20 | Discharge: 2018-01-20 | Disposition: A | Discharge status: Home / Self Care | Payer: Medicare Other | Source: Ambulatory Visit | Attending: Interventional Cardiology | Admitting: Interventional Cardiology

## 2018-01-20 ENCOUNTER — Other Ambulatory Visit: Payer: Self-pay

## 2018-01-20 ENCOUNTER — Encounter (HOSPITAL_COMMUNITY)
Admission: RE | Disposition: A | Discharge status: Home / Self Care | Payer: Self-pay | Source: Ambulatory Visit | Attending: Interventional Cardiology

## 2018-01-20 DIAGNOSIS — R0602 Shortness of breath: Secondary | ICD-10-CM | POA: Diagnosis not present

## 2018-01-20 DIAGNOSIS — I10 Essential (primary) hypertension: Secondary | ICD-10-CM | POA: Diagnosis present

## 2018-01-20 DIAGNOSIS — M1990 Unspecified osteoarthritis, unspecified site: Secondary | ICD-10-CM | POA: Insufficient documentation

## 2018-01-20 DIAGNOSIS — I480 Paroxysmal atrial fibrillation: Secondary | ICD-10-CM | POA: Diagnosis not present

## 2018-01-20 DIAGNOSIS — Z9889 Other specified postprocedural states: Secondary | ICD-10-CM | POA: Insufficient documentation

## 2018-01-20 DIAGNOSIS — Z7901 Long term (current) use of anticoagulants: Secondary | ICD-10-CM | POA: Diagnosis not present

## 2018-01-20 DIAGNOSIS — Z8249 Family history of ischemic heart disease and other diseases of the circulatory system: Secondary | ICD-10-CM | POA: Diagnosis not present

## 2018-01-20 DIAGNOSIS — I5023 Acute on chronic systolic (congestive) heart failure: Secondary | ICD-10-CM

## 2018-01-20 DIAGNOSIS — E7800 Pure hypercholesterolemia, unspecified: Secondary | ICD-10-CM | POA: Insufficient documentation

## 2018-01-20 DIAGNOSIS — Z79899 Other long term (current) drug therapy: Secondary | ICD-10-CM | POA: Diagnosis not present

## 2018-01-20 DIAGNOSIS — I429 Cardiomyopathy, unspecified: Secondary | ICD-10-CM | POA: Insufficient documentation

## 2018-01-20 DIAGNOSIS — I2510 Atherosclerotic heart disease of native coronary artery without angina pectoris: Secondary | ICD-10-CM | POA: Diagnosis not present

## 2018-01-20 DIAGNOSIS — Z9841 Cataract extraction status, right eye: Secondary | ICD-10-CM | POA: Insufficient documentation

## 2018-01-20 DIAGNOSIS — R9439 Abnormal result of other cardiovascular function study: Secondary | ICD-10-CM

## 2018-01-20 DIAGNOSIS — I5020 Unspecified systolic (congestive) heart failure: Secondary | ICD-10-CM

## 2018-01-20 DIAGNOSIS — Z9842 Cataract extraction status, left eye: Secondary | ICD-10-CM | POA: Diagnosis not present

## 2018-01-20 HISTORY — PX: RIGHT/LEFT HEART CATH AND CORONARY ANGIOGRAPHY: CATH118266

## 2018-01-20 LAB — CBC
HEMATOCRIT: 47.9 % (ref 39.0–52.0)
HEMOGLOBIN: 15.5 g/dL (ref 13.0–17.0)
MCH: 28.7 pg (ref 26.0–34.0)
MCHC: 32.4 g/dL (ref 30.0–36.0)
MCV: 88.7 fL (ref 78.0–100.0)
Platelets: 235 10*3/uL (ref 150–400)
RBC: 5.4 MIL/uL (ref 4.22–5.81)
RDW: 14 % (ref 11.5–15.5)
WBC: 7.8 10*3/uL (ref 4.0–10.5)

## 2018-01-20 LAB — POCT I-STAT 3, ART BLOOD GAS (G3+)
Acid-base deficit: 1 mmol/L (ref 0.0–2.0)
BICARBONATE: 25.3 mmol/L (ref 20.0–28.0)
O2 Saturation: 95 %
PCO2 ART: 49.5 mmHg — AB (ref 32.0–48.0)
TCO2: 27 mmol/L (ref 22–32)
pH, Arterial: 7.318 — ABNORMAL LOW (ref 7.350–7.450)
pO2, Arterial: 82 mmHg — ABNORMAL LOW (ref 83.0–108.0)

## 2018-01-20 LAB — POCT I-STAT 3, VENOUS BLOOD GAS (G3P V)
ACID-BASE DEFICIT: 2 mmol/L (ref 0.0–2.0)
Bicarbonate: 24.8 mmol/L (ref 20.0–28.0)
O2 SAT: 66 %
PCO2 VEN: 48.9 mmHg (ref 44.0–60.0)
PH VEN: 7.312 (ref 7.250–7.430)
PO2 VEN: 38 mmHg (ref 32.0–45.0)
TCO2: 26 mmol/L (ref 22–32)

## 2018-01-20 LAB — BASIC METABOLIC PANEL
ANION GAP: 9 (ref 5–15)
BUN: 18 mg/dL (ref 8–23)
CO2: 27 mmol/L (ref 22–32)
Calcium: 9.2 mg/dL (ref 8.9–10.3)
Chloride: 106 mmol/L (ref 98–111)
Creatinine, Ser: 1.54 mg/dL — ABNORMAL HIGH (ref 0.61–1.24)
GFR calc Af Amer: 51 mL/min — ABNORMAL LOW (ref >60)
GFR calc non Af Amer: 44 mL/min — ABNORMAL LOW (ref >60)
GLUCOSE: 106 mg/dL — AB (ref 70–99)
Potassium: 4.6 mmol/L (ref 3.5–5.1)
Sodium: 142 mmol/L (ref 135–145)

## 2018-01-20 SURGERY — RIGHT/LEFT HEART CATH AND CORONARY ANGIOGRAPHY
Anesthesia: LOCAL

## 2018-01-20 MED ORDER — SODIUM CHLORIDE 0.9% FLUSH: 3.0000 mL | Freq: Two times a day (BID) | INTRAVENOUS | Status: DC

## 2018-01-20 MED ORDER — HEPARIN (PORCINE) IN NACL 1000-0.9 UT/500ML-% IV SOLN
INTRAVENOUS | Status: AC
  Filled 2018-01-20: qty 500

## 2018-01-20 MED ORDER — VERAPAMIL HCL 2.5 MG/ML IV SOLN
INTRAVENOUS | Status: AC
  Filled 2018-01-20: qty 4

## 2018-01-20 MED ORDER — SODIUM CHLORIDE 0.9 % IV SOLN: INTRAVENOUS | Status: DC

## 2018-01-20 MED ORDER — LIDOCAINE HCL (PF) 1 % IJ SOLN
INTRAMUSCULAR | Status: AC
  Filled 2018-01-20: qty 30

## 2018-01-20 MED ORDER — VERAPAMIL HCL 2.5 MG/ML IV SOLN
INTRAVENOUS | Status: DC | PRN
  Administered 2018-01-20: 10 mL via INTRA_ARTERIAL

## 2018-01-20 MED ORDER — IOHEXOL 350 MG/ML SOLN
INTRAVENOUS | Status: DC | PRN
  Administered 2018-01-20: 110 mL via INTRA_ARTERIAL

## 2018-01-20 MED ORDER — OXYCODONE HCL 5 MG PO TABS: 5.0000 mg | ORAL_TABLET | ORAL | Status: DC | PRN

## 2018-01-20 MED ORDER — SODIUM CHLORIDE 0.9 % IV SOLN: 250.0000 mL | INTRAVENOUS | Status: DC | PRN

## 2018-01-20 MED ORDER — HEPARIN SODIUM (PORCINE) 1000 UNIT/ML IJ SOLN
INTRAMUSCULAR | Status: DC | PRN
  Administered 2018-01-20: 4000 [IU] via INTRAVENOUS

## 2018-01-20 MED ORDER — ASPIRIN 81 MG PO CHEW: 81.0000 mg | CHEWABLE_TABLET | ORAL | Status: DC

## 2018-01-20 MED ORDER — SODIUM CHLORIDE 0.9% FLUSH: 3.0000 mL | INTRAVENOUS | Status: DC | PRN

## 2018-01-20 MED ORDER — SODIUM CHLORIDE 0.9 % IV SOLN
INTRAVENOUS | Status: DC
  Administered 2018-01-20: 13:00:00 via INTRAVENOUS

## 2018-01-20 MED ORDER — FENTANYL CITRATE (PF) 100 MCG/2ML IJ SOLN
INTRAMUSCULAR | Status: AC
Start: 2018-01-20 — End: ?
  Filled 2018-01-20: qty 2

## 2018-01-20 MED ORDER — MIDAZOLAM HCL 2 MG/2ML IJ SOLN
INTRAMUSCULAR | Status: AC
  Filled 2018-01-20: qty 2

## 2018-01-20 MED ORDER — ONDANSETRON HCL 4 MG/2ML IJ SOLN: 4.0000 mg | Freq: Four times a day (QID) | INTRAMUSCULAR | Status: DC | PRN

## 2018-01-20 MED ORDER — ACETAMINOPHEN 325 MG PO TABS: 650.0000 mg | ORAL_TABLET | ORAL | Status: DC | PRN

## 2018-01-20 MED ORDER — HEPARIN (PORCINE) IN NACL 1000-0.9 UT/500ML-% IV SOLN
INTRAVENOUS | Status: DC | PRN
  Administered 2018-01-20 (×2): 500 mL

## 2018-01-20 MED ORDER — MIDAZOLAM HCL 2 MG/2ML IJ SOLN
INTRAMUSCULAR | Status: DC | PRN
  Administered 2018-01-20: 1 mg via INTRAVENOUS

## 2018-01-20 MED ORDER — FENTANYL CITRATE (PF) 100 MCG/2ML IJ SOLN
INTRAMUSCULAR | Status: DC | PRN
  Administered 2018-01-20: 25 ug via INTRAVENOUS

## 2018-01-20 MED ORDER — HEPARIN SODIUM (PORCINE) 1000 UNIT/ML IJ SOLN
INTRAMUSCULAR | Status: AC
  Filled 2018-01-20: qty 1

## 2018-01-20 MED ORDER — LIDOCAINE HCL (PF) 1 % IJ SOLN
INTRAMUSCULAR | Status: DC | PRN
  Administered 2018-01-20: 2 mL

## 2018-01-20 SURGICAL SUPPLY — 18 items
CATH 5FR JL3.5 JR4 ANG PIG MP (CATHETERS) ×2 IMPLANT
CATH BALLN WEDGE 5F 110CM (CATHETERS) ×2 IMPLANT
CATH INFINITI 5 FR MPA2 (CATHETERS) ×2 IMPLANT
CATH INFINITI 5FR AL1 (CATHETERS) ×2 IMPLANT
CATH INFINITI JR4 5F (CATHETERS) ×2 IMPLANT
CATH OPTITORQUE TIG 4.0 5F (CATHETERS) ×2 IMPLANT
COVER PRB 48X5XTLSCP FOLD TPE (BAG) ×1 IMPLANT
COVER PROBE 5X48 (BAG) ×1
DEVICE RAD COMP TR BAND LRG (VASCULAR PRODUCTS) ×2 IMPLANT
GLIDESHEATH SLEND A-KIT 6F 22G (SHEATH) ×2 IMPLANT
GUIDEWIRE .025 260CM (WIRE) ×2 IMPLANT
GUIDEWIRE INQWIRE 1.5J.035X260 (WIRE) ×1 IMPLANT
INQWIRE 1.5J .035X260CM (WIRE) ×2
KIT HEART LEFT (KITS) ×2 IMPLANT
PACK CARDIAC CATHETERIZATION (CUSTOM PROCEDURE TRAY) ×2 IMPLANT
SHEATH GLIDE SLENDER 4/5FR (SHEATH) ×2 IMPLANT
TRANSDUCER W/STOPCOCK (MISCELLANEOUS) ×2 IMPLANT
TUBING CIL FLEX 10 FLL-RA (TUBING) ×2 IMPLANT

## 2018-01-20 NOTE — Discharge Instructions (Signed)

## 2018-01-20 NOTE — Interval H&P Note (Signed)
Cath Lab Visit (complete for each Cath Lab visit)  Clinical Evaluation Leading to the Procedure:   ACS: No.  Non-ACS:    Anginal Classification: CCS Cobb  Anti-ischemic medical therapy: Minimal Therapy (1 class of medications)  Non-Invasive Test Results: High-risk stress test findings: cardiac mortality >3%/year  Prior CABG: No previous CABG      History and Physical Interval Note:  01/20/2018 2:12 PM  John Cobb  has presented today for surgery, with the diagnosis of abnormal stress test  The various methods of treatment have been discussed with the patient and family. After consideration of risks, benefits and other options for treatment, the patient has consented to  Procedure(s): RIGHT/LEFT HEART CATH AND CORONARY ANGIOGRAPHY (N/A) as a surgical intervention .  The patient's history has been reviewed, patient examined, no change in status, stable for surgery.  I have reviewed the patient's chart and labs.  Questions were answered to the patient's satisfaction.     John Cobb

## 2018-01-20 NOTE — Progress Notes (Signed)
BP elevated, cuff adjusted and remains high, pt took metoprolol today but has not had losartan x 3 days. [facility] cardiology notified, no further orders at this time, continue to monitor.

## 2018-01-21 ENCOUNTER — Encounter (HOSPITAL_COMMUNITY): Payer: Self-pay | Admitting: Interventional Cardiology

## 2018-01-24 DIAGNOSIS — T63441D Toxic effect of venom of bees, accidental (unintentional), subsequent encounter: Secondary | ICD-10-CM | POA: Diagnosis not present

## 2018-01-24 DIAGNOSIS — T63461D Toxic effect of venom of wasps, accidental (unintentional), subsequent encounter: Secondary | ICD-10-CM | POA: Diagnosis not present

## 2018-01-24 DIAGNOSIS — T63451D Toxic effect of venom of hornets, accidental (unintentional), subsequent encounter: Secondary | ICD-10-CM | POA: Diagnosis not present

## 2018-02-02 ENCOUNTER — Encounter: Payer: Self-pay | Admitting: Physician Assistant

## 2018-02-02 ENCOUNTER — Ambulatory Visit (INDEPENDENT_AMBULATORY_CARE_PROVIDER_SITE_OTHER): Payer: Medicare Other | Admitting: Physician Assistant

## 2018-02-02 ENCOUNTER — Other Ambulatory Visit: Payer: Medicare Other

## 2018-02-02 VITALS — BP 128/86 | HR 100 | Ht 72.0 in | Wt 188.0 lb

## 2018-02-02 DIAGNOSIS — Z79899 Other long term (current) drug therapy: Secondary | ICD-10-CM

## 2018-02-02 DIAGNOSIS — I5020 Unspecified systolic (congestive) heart failure: Secondary | ICD-10-CM | POA: Diagnosis not present

## 2018-02-02 DIAGNOSIS — I480 Paroxysmal atrial fibrillation: Secondary | ICD-10-CM | POA: Diagnosis not present

## 2018-02-02 DIAGNOSIS — I10 Essential (primary) hypertension: Secondary | ICD-10-CM

## 2018-02-02 DIAGNOSIS — I5022 Chronic systolic (congestive) heart failure: Secondary | ICD-10-CM | POA: Diagnosis not present

## 2018-02-02 DIAGNOSIS — N183 Chronic kidney disease, stage 3 unspecified: Secondary | ICD-10-CM | POA: Insufficient documentation

## 2018-02-02 DIAGNOSIS — R7989 Other specified abnormal findings of blood chemistry: Secondary | ICD-10-CM

## 2018-02-02 DIAGNOSIS — R0602 Shortness of breath: Secondary | ICD-10-CM | POA: Diagnosis not present

## 2018-02-02 DIAGNOSIS — I428 Other cardiomyopathies: Secondary | ICD-10-CM | POA: Diagnosis not present

## 2018-02-02 MED ORDER — APIXABAN 5 MG PO TABS: 5.0000 mg | ORAL_TABLET | Freq: Two times a day (BID) | ORAL | 11 refills | Status: DC

## 2018-02-02 MED ORDER — FUROSEMIDE 40 MG PO TABS: 20.0000 mg | ORAL_TABLET | Freq: Every day | ORAL | 3 refills | Status: DC

## 2018-02-02 MED ORDER — METOPROLOL SUCCINATE ER 50 MG PO TB24: 50.0000 mg | ORAL_TABLET | Freq: Every day | ORAL | 3 refills | Status: DC

## 2018-02-02 NOTE — Patient Instructions (Addendum)
Medication Instructions:  1. DECREASE LASIX TO ONE-HALF TABLET (20 MG) DAILY  2. INCREASE METOPROLOL TO 50 MG DAILY.    Labwork: NONE ORDERED   Testing/Procedures: NONE ORDERED   Follow-Up: Your physician recommends that you schedule a follow-up appointment WITH DR. Radford Pax OR Ermalinda Barrios, NP IN [place].   Any Other Special Instructions Will Be Listed Below (If Applicable).     If you need a refill on your cardiac medications before your next appointment, please call your pharmacy.  Two Gram Sodium Diet 2000 mg  What is Sodium? Sodium is a mineral found naturally in many foods. The most significant source of sodium in the diet is table salt, which is about 40% sodium.  Processed, convenience, and preserved foods also contain a large amount of sodium.  The body needs only 500 mg of sodium daily to function,  A normal diet provides more than enough sodium even if you do not use salt.  Why Limit Sodium? A build up of sodium in the body can cause thirst, increased blood pressure, shortness of breath, and water retention.  Decreasing sodium in the diet can reduce edema and risk of heart attack or stroke associated with high blood pressure.  Keep in mind that there are many other factors involved in these health problems.  Heredity, obesity, lack of exercise, cigarette smoking, stress and what you eat all play a role.  General Guidelines:  Do not add salt at the table or in cooking.  One teaspoon of salt contains over 2 grams of sodium.  Read food labels  Avoid processed and convenience foods  Ask your dietitian before eating any foods not dicussed in the menu planning guidelines  Consult your physician if you wish to use a salt substitute or a sodium containing medication such as antacids.  Limit milk and milk products to 16 oz (2 cups) per day.  Shopping Hints:  READ LABELS!! "Dietetic" does not necessarily mean low sodium.  Salt and other sodium ingredients are often added  to foods during processing.   Menu Planning Guidelines Food Group Choose More Often Avoid  Beverages (see also the milk group All fruit juices, low-sodium, salt-free vegetables juices, low-sodium carbonated beverages Regular vegetable or tomato juices, commercially softened water used for drinking or cooking  Breads and Cereals Enriched white, wheat, rye and pumpernickel bread, hard rolls and dinner rolls; muffins, cornbread and waffles; most dry cereals, cooked cereal without added salt; unsalted crackers and breadsticks; low sodium or homemade bread crumbs Bread, rolls and crackers with salted tops; quick breads; instant hot cereals; pancakes; commercial bread stuffing; self-rising flower and biscuit mixes; regular bread crumbs or cracker crumbs  Desserts and Sweets Desserts and sweets mad with mild should be within allowance Instant pudding mixes and cake mixes  Fats Butter or margarine; vegetable oils; unsalted salad dressings, regular salad dressings limited to 1 Tbs; light, sour and heavy cream Regular salad dressings containing bacon fat, bacon bits, and salt pork; snack dips made with instant soup mixes or processed cheese; salted nuts  Fruits Most fresh, frozen and canned fruits Fruits processed with salt or sodium-containing ingredient (some dried fruits are processed with sodium sulfites        Vegetables Fresh, frozen vegetables and low- sodium canned vegetables Regular canned vegetables, sauerkraut, pickled vegetables, and others prepared in brine; frozen vegetables in sauces; vegetables seasoned with ham, bacon or salt pork  Condiments, Sauces, Miscellaneous  Salt substitute with physician's approval; pepper, herbs, spices; vinegar, lemon or lime  juice; hot pepper sauce; garlic powder, onion powder, low sodium soy sauce (1 Tbs.); low sodium condiments (ketchup, chili sauce, mustard) in limited amounts (1 tsp.) fresh ground horseradish; unsalted tortilla chips, pretzels, potato chips,  popcorn, salsa (1/4 cup) Any seasoning made with salt including garlic salt, celery salt, onion salt, and seasoned salt; sea salt, rock salt, kosher salt; meat tenderizers; monosodium glutamate; mustard, regular soy sauce, barbecue, sauce, chili sauce, teriyaki sauce, steak sauce, Worcestershire sauce, and most flavored vinegars; canned gravy and mixes; regular condiments; salted snack foods, olives, picles, relish, horseradish sauce, catsup   Food preparation: Try these seasonings Meats:    Pork Sage, onion Serve with applesauce  Chicken Poultry seasoning, thyme, parsley Serve with cranberry sauce  Lamb Curry powder, rosemary, garlic, thyme Serve with mint sauce or jelly  Veal Marjoram, basil Serve with current jelly, cranberry sauce  Beef Pepper, bay leaf Serve with dry mustard, unsalted chive butter  Fish [place] leaf, dill Serve with unsalted lemon butter, unsalted parsley butter  Vegetables:    Asparagus Lemon juice   Broccoli Lemon juice   Carrots Mustard dressing parsley, mint, nutmeg, glazed with unsalted butter and sugar   Green beans Marjoram, lemon juice, nutmeg,dill seed   Tomatoes Basil, marjoram, onion   Spice /blend for [employer]" 4 tsp ground thyme 1 tsp ground sage 3 tsp ground rosemary 4 tsp ground marjoram   Test your knowledge 1. A product that says "Salt Free" may still contain sodium. True or False 2. Garlic Powder and Hot Pepper Sauce an be used as alternative seasonings.True or False 3. Processed foods have more sodium than fresh foods.  True or False 4. Canned Vegetables have less sodium than froze True or False  WAYS TO DECREASE YOUR SODIUM INTAKE 1. Avoid the use of added salt in cooking and at the table.  Table salt (and other prepared seasonings which contain salt) is probably one of the greatest sources of sodium in the diet.  Unsalted foods can gain flavor from the sweet, sour, and butter taste sensations of herbs and spices.  Instead of using salt for  seasoning, try the following seasonings with the foods listed.  Remember: how you use them to enhance natural food flavors is limited only by your creativity... Allspice-Meat, fish, eggs, fruit, peas, red and yellow vegetables Almond Extract-Fruit baked goods Anise Seed-Sweet breads, fruit, carrots, beets, cottage cheese, cookies (tastes like licorice) Basil-Meat, fish, eggs, vegetables, rice, vegetables salads, soups, sauces Bay Leaf-Meat, fish, stews, poultry Burnet-Salad, vegetables (cucumber-like flavor) Caraway Seed-Bread, cookies, cottage cheese, meat, vegetables, cheese, rice Cardamon-Baked goods, fruit, soups Celery Powder or seed-Salads, salad dressings, sauces, meatloaf, soup, bread.Do not use  celery salt Chervil-Meats, salads, fish, eggs, vegetables, cottage cheese (parsley-like flavor) Chili Power-Meatloaf, chicken cheese, corn, eggplant, egg dishes Chives-Salads cottage cheese, egg dishes, soups, vegetables, sauces Cilantro-Salsa, casseroles Cinnamon-Baked goods, fruit, pork, lamb, chicken, carrots Cloves-Fruit, baked goods, fish, pot roast, green beans, beets, carrots Coriander-Pastry, cookies, meat, salads, cheese (lemon-orange flavor) Cumin-Meatloaf, fish,cheese, eggs, cabbage,fruit pie (caraway flavor) [employer], fruit, eggs, fish, poultry, cottage cheese, vegetables Dill Seed-Meat, cottage cheese, poultry, vegetables, fish, salads, bread Fennel Seed-Bread, cookies, apples, pork, eggs, fish, beets, cabbage, cheese, Licorice-like flavor Garlic-(buds or powder) Salads, meat, poultry, fish, bread, butter, vegetables, potatoes.Do not  use garlic salt Ginger-Fruit, vegetables, baked goods, meat, fish, poultry Horseradish Root-Meet, vegetables, butter Lemon Juice or Extract-Vegetables, fruit, tea, baked goods, fish salads Mace-Baked goods fruit, vegetables, fish, poultry (taste like nutmeg) Maple Extract-Syrups Marjoram-Meat, chicken, fish, vegetables,  breads, green  salads (taste like Sage) Mint-Tea, lamb, sherbet, vegetables, desserts, carrots, cabbage Mustard, Dry or Seed-Cheese, eggs, meats, vegetables, poultry Nutmeg-Baked goods, fruit, chicken, eggs, vegetables, desserts Onion Powder-Meat, fish, poultry, vegetables, cheese, eggs, bread, rice salads (Do not use   Onion salt) Orange Extract-Desserts, baked goods Oregano-Pasta, eggs, cheese, onions, pork, lamb, fish, chicken, vegetables, green salads Paprika-Meat, fish, poultry, eggs, cheese, vegetables Parsley Flakes-Butter, vegetables, meat fish, poultry, eggs, bread, salads (certain forms may   Contain sodium Pepper-Meat fish, poultry, vegetables, eggs Peppermint Extract-Desserts, baked goods Poppy Seed-Eggs, bread, cheese, fruit dressings, baked goods, noodles, vegetables, cottage  [employer], poultry, meat, fish, cauliflower, turnips,eggs bread Saffron-Rice, bread, veal, chicken, fish, eggs Sage-Meat, fish, poultry, onions, eggplant, tomateos, pork, stews Savory-Eggs, salads, poultry, meat, rice, vegetables, soups, pork Tarragon-Meat, poultry, fish, eggs, butter, vegetables (licorice-like flavor)  Thyme-Meat, poultry, fish, eggs, vegetables, (clover-like flavor), sauces, soups Tumeric-Salads, butter, eggs, fish, rice, vegetables (saffron-like flavor) Vanilla Extract-Baked goods, candy Vinegar-Salads, vegetables, meat marinades Walnut Extract-baked goods, candy  2. Choose your Foods Wisely   The following is a list of foods to avoid which are high in sodium:  Meats-Avoid all smoked, canned, salt cured, dried and kosher meat and fish as well as Anchovies   [employer] meats:Bologna, Liverwurst, Pastrami Canned meat or fish  Marinated herring Caviar    Pepperoni Corned Beef   Pizza Dried chipped beef  Salami Frozen breaded fish or meat Salt pork Frankfurters or hot dogs  Sardines Gefilte fish   Sausage Ham (boiled ham,  Proscuitto Smoked butt    spiced ham)   Spam      TV Dinners Vegetables Canned vegetables (Regular) Relish Canned mushrooms  Sauerkraut Olives    Tomato juice Pickles  Bakery and Dessert Products Canned puddings  Cream pies Cheesecake   Decorated cakes Cookies  Beverages/Juices Tomato juice, regular  Gatorade   V-8 vegetable juice, regular  Breads and Cereals Biscuit mixes   Salted potato chips, corn chips, pretzels Bread stuffing mixes  Salted crackers and rolls Pancake and waffle mixes Self-rising flour  Seasonings Accent    Meat sauces Barbecue sauce  Meat tenderizer Catsup    Monosodium glutamate (MSG) Celery salt   Onion salt Chili sauce   Prepared mustard Garlic salt   Salt, seasoned salt, sea salt Gravy mixes   Soy sauce Horseradish   Steak sauce Ketchup   Tartar sauce Lite salt    Teriyaki sauce Marinade mixes   Worcestershire sauce  Others Baking powder   Cocoa and cocoa mixes Baking soda   Commercial casserole mixes Candy-caramels, chocolate  Dehydrated soups    Bars, fudge,nougats  Instant rice and pasta mixes Canned broth or soup  Maraschino cherries Cheese, aged and processed cheese and cheese spreads  Learning Assessment Quiz  Indicated T (for True) or F (for False) for each of the following statements:  1. _____ Fresh fruits and vegetables and unprocessed grains are generally low in sodium 2. _____ Water may contain a considerable amount of sodium, depending on the source 3. _____ You can always tell if a food is high in sodium by tasting it 4. _____ Certain laxatives my be high in sodium and should be avoided unless prescribed   by a physician or pharmacist 5. _____ Salt substitutes may be used freely by anyone on a sodium restricted diet 6. _____ Sodium is present in table salt, food additives and as a natural component of   most foods 7. _____  Table salt is approximately 90% sodium 8. _____ Limiting sodium intake may help prevent excess fluid  accumulation in the body 9. _____ On a sodium-restricted diet, seasonings such as bouillon soy sauce, and    cooking wine should be used in place of table salt 10. _____ On an ingredient list, a product which lists monosodium glutamate as the first   ingredient is an appropriate food to include on a low sodium diet  Circle the best answer(s) to the following statements (Hint: there may be more than one correct answer)  11. On a low-sodium diet, some acceptable snack items are:    A. Olives  F. Bean dip   K. Grapefruit juice    B. Salted Pretzels G. Commercial Popcorn   L. Canned peaches    C. Carrot Sticks  H. Bouillon   M. Unsalted nuts   D. [place] fries  I. Peanut butter crackers N. Salami   E. Sweet pickles J. Tomato Juice   O. Pizza  12.  Seasonings that may be used freely on a reduced - sodium diet include   A. Lemon wedges F.Monosodium glutamate K. Celery seed    B.Soysauce   G. Pepper   L. Mustard powder   C. Sea salt  H. Cooking wine  M. Onion flakes   D. Vinegar  E. Prepared horseradish N. Salsa   E. Sage   J. Worcestershire sauce  O. Chutney

## 2018-02-02 NOTE — Progress Notes (Signed)
Cardiology Office Note    Date:  02/02/2018   ID:  John Cobb, DOB 11-25-1945, MRN [id number]  PCP:  Hulan Fess, MD  Cardiologist: Fransico Him, MD  Chief Complaint  Patient presents with  . Hospitalization Follow-up    History of Present Illness:  John Cobb is a 72 y.o. male with history of hypertension, hyperlipidemia and newly diagnosed atrial fibrillation CVR on Toprol and Eliquis for CHA2DS2-VASc of 2.  2D echo performed and showed a severe LV dysfunction ejection fraction 20 to 25% with severe diffuse hypokinesis.  Stress test EF 19% with findings of ischemia high risk study that led to cardiac catheterization.  Cardiac cath showed nonobstructive CAD and he was felt to have nonischemic cardiomyopathy possibly related to A. fib and tachycardia induced mechanism.  Patient comes in today accompanied by his wife.  His breathing is much improved since taking Lasix.  They eat out at diners every day.  He does not add salt to anything.  He needs a prescription for Eliquis.  He did like to try taking a lower dose Lasix.  Heart rate still going fast.  Gets up to 130 at times at home.  Denies dyspnea, edema, palpitations.  He cannot feel the A. fib but checks his heart rate on his blood pressure monitor.   Past Medical History:  Diagnosis Date  . Arthritis   . Benign essential HTN 01/05/2018  . Blood transfusion    As an infant  . Dislocated IOL (intraocular lens), posterior 10/02/2011  . High cholesterol   . Hypertension   . Paroxysmal atrial fibrillation ([place]) 01/05/2018  . Pneumonia ~ 1978  . SOB (shortness of breath) 01/05/2018    Past Surgical History:  Procedure Laterality Date  . ANTERIOR CERVICAL DISCECTOMY  2008  . APPENDECTOMY     "as an infant"  . CATARACT EXTRACTION W/ INTRAOCULAR LENS IMPLANT  08/13/11   left  . CATARACT EXTRACTION W/ INTRAOCULAR LENS IMPLANT  09/03/11   right  . Colonscopy    . Compound Fractures  06/1980   right arm; repaired with 2  plates and 10 pins.  . FRACTURE SURGERY    . GAS/FLUID EXCHANGE  10/20/2011   Procedure: GAS/FLUID EXCHANGE;  Surgeon: Hayden Pedro, MD;  Location: [facility];  Service: Ophthalmology;  Laterality: Right;  . HERNIA REPAIR     "umbilical; as an infant"  . INTRAOCULAR LENS EXCHANGE  10/20/11   right eye  . PARS PLANA VITRECTOMY  10/20/2011   Procedure: PARS PLANA VITRECTOMY WITH 25G REMOVAL/SUTURE INTRAOCULAR LENS;  Surgeon: Hayden Pedro, MD;  Location: [facility];  Service: Ophthalmology;  Laterality: Right;  . RIGHT/LEFT HEART CATH AND CORONARY ANGIOGRAPHY N/A 01/20/2018   Procedure: RIGHT/LEFT HEART CATH AND CORONARY ANGIOGRAPHY;  Surgeon: Belva Crome, MD;  Location: [facility];  Service: Cardiovascular;  Laterality: N/A;    Current Medications: Current Meds  Medication Sig  . apixaban (ELIQUIS) 5 MG TABS tablet Take 1 tablet (5 mg total) by mouth 2 (two) times daily.  . fish oil-omega-3 fatty acids 1000 MG capsule Take 1 g by mouth daily.   . furosemide (LASIX) 40 MG tablet Take 0.5 tablets (20 mg total) by mouth daily.  Marland Kitchen losartan (COZAAR) 100 MG tablet Take 100 mg by mouth daily.  . metoprolol succinate (TOPROL-XL) 50 MG 24 hr tablet Take 1 tablet (50 mg total) by mouth daily.  . Multiple Vitamin (MULITIVITAMIN WITH MINERALS) TABS Take 1 tablet by mouth  daily.  . nicotine polacrilex (NICORETTE) 4 MG gum Take 4 mg by mouth as needed for smoking cessation.   . pravastatin (PRAVACHOL) 80 MG tablet Take 80 mg by mouth daily.  . tamsulosin (FLOMAX) 0.4 MG CAPS capsule Take 0.4 mg by mouth daily.  Marland Kitchen triamcinolone cream (KENALOG) 0.1 % Apply 1 application topically 2 (two) times daily.  . [DISCONTINUED] apixaban (ELIQUIS) 5 MG TABS tablet Take 5 mg by mouth 2 (two) times daily.   . [DISCONTINUED] furosemide (LASIX) 40 MG tablet TAKE 1 TABLET(40 MG) BY MOUTH DAILY  . [DISCONTINUED] metoprolol succinate (TOPROL XL) 25 MG 24 hr tablet Take 1 tablet (25 mg total) by mouth daily.      Allergies:   Neosporin [neomycin-bacitracin zn-polymyx]   Social History   Socioeconomic History  . Marital status: Married    Spouse name: Not on file  . Number of children: Not on file  . Years of education: Not on file  . Highest education level: Not on file  Occupational History  . Not on file  Social Needs  . Financial resource strain: Not on file  . Food insecurity:    Worry: Not on file    Inability: Not on file  . Transportation needs:    Medical: Not on file    Non-medical: Not on file  Tobacco Use  . Smoking status: Never Smoker  . Smokeless tobacco: Former Systems developer    Types: Chew  Substance and Sexual Activity  . Alcohol use: No  . Drug use: No  . Sexual activity: Not Currently  Lifestyle  . Physical activity:    Days per week: Not on file    Minutes per session: Not on file  . Stress: Not on file  Relationships  . Social connections:    Talks on phone: Not on file    Gets together: Not on file    Attends religious service: Not on file    Active member of club or organization: Not on file    Attends meetings of clubs or organizations: Not on file    Relationship status: Not on file  Other Topics Concern  . Not on file  Social History Narrative  . Not on file     Family History:  The patient's family history includes Heart attack in his father; Lung cancer in his mother; Stomach cancer in his mother.   ROS:   Please see the history of present illness.    Review of Systems  Constitution: Negative.  HENT: Negative.   Cardiovascular: Negative.   Respiratory: Negative.   Endocrine: Negative.   Hematologic/Lymphatic: Negative.   Musculoskeletal: Negative.   Gastrointestinal: Negative.   Genitourinary: Negative.   Neurological: Negative.    All other systems reviewed and are negative.   PHYSICAL EXAM:   VS:  BP 128/86   Pulse 100   Ht 6' (1.829 m)   Wt 188 lb (85.3 kg)   SpO2 91%   BMI 25.50 kg/m   Physical Exam  GEN: Well nourished, well  developed, in no acute distress  Neck: no JVD, carotid bruits, or masses Cardiac: Irregularly irregular; no murmurs, rubs, or gallops  Respiratory:  clear to auscultation bilaterally, normal work of breathing GI: soft, nontender, nondistended, + BS Ext: Right arm at cath site without hematoma or hemorrhage good radial brachial pulses, lower extremities without cyanosis, clubbing, or edema, Good distal pulses bilaterally Neuro:  Alert and Oriented x 3 Psych: euthymic mood, full affect  Wt Readings from Last  3 Encounters:  02/02/18 188 lb (85.3 kg)  01/20/18 185 lb (83.9 kg)  01/17/18 193 lb (87.5 kg)      Studies/Labs Reviewed:   EKG:  EKG is  ordered today.  The ekg ordered today demonstrates atrial fibrillation at 98 bpm  Recent Labs: 01/11/2018: BNP 1,014.7 01/20/2018: BUN 18; Creatinine, Ser 1.54; Hemoglobin 15.5; Platelets 235; Potassium 4.6; Sodium 142   Lipid Panel No results found for: CHOL, TRIG, HDL, CHOLHDL, VLDL, LDLCALC, LDLDIRECT  Additional studies/ records that were reviewed today include:  Nuclear stress test 01/17/2018  The left ventricular ejection fraction is severely decreased (<30%).  Nuclear stress EF: 19%.  There was no ST segment deviation noted during stress.  Defect 1: There is a medium defect of moderate severity present in the basal inferior, basal inferolateral, mid inferior and apical inferior location.  Findings consistent with ischemia.  This is a high risk study.   This is an abnormal stress test, there is a medium size, moderate severity reversible defect in the basal and mid inferior and inferolateral walls, mid and apical inferior walls consistent with ischemia in the LCX territory. (SDS =5). There is severe left ventricular dysfunction with LVEF 15% with diffuse hypokinesis.  2D echo 01/17/2018 Study Conclusions   - Left ventricle: The cavity size was mildly dilated. Systolic   function was severely reduced. The estimated ejection  fraction   was in the range of 20% to 25%. Severe diffuse hypokinesis with   regional variations. The study was not technically sufficient to   allow evaluation of LV diastolic dysfunction due to atrial   fibrillation. Doppler parameters are consistent with high   ventricular filling pressure. - Aortic valve: Trileaflet; mildly thickened, mildly calcified   leaflets. There was trivial regurgitation. - Mitral valve: Calcified annulus. There was trivial regurgitation. - Left atrium: The atrium was mildly dilated. - Tricuspid valve: There was trivial regurgitation. - Pulmonary arteries: PA peak pressure: 42 mm Hg (S).   Impressions:   - The right ventricular systolic pressure was increased consistent   with mild pulmonary hypertension. Cardiac catheterization 01/20/2018   Normal left main  Proximal to mid LAD 30% eccentric narrowing at the bifurcation with a large diagonal.  The first diagonal contains 30% eccentric stenosis.  The  Circumflex origin to 2 obtuse marginal branches, both of which are widely patent.  RCA is not well-visualized.  Ostial and mid vessel luminal irregularities.  No significant obstruction.  Normal pulmonary pressures.  LVEDP 17 mmHg.  Pulmonary capillary wedge mean is 18 to 20 mmHg.  Nonischemic cardiomyopathy, possibly related to atrial fibrillation and tachycardia induced mechanism.   RECOMMENDATIONS:    Resume apixaban in a.m.  Further management per Dr. Radford Pax        ASSESSMENT:    1. Paroxysmal atrial fibrillation (HCC)   2. Chronic systolic heart failure ([place])   3. Benign essential HTN   4. Nonischemic cardiomyopathy ([place])   5. CKD (chronic kidney disease) stage 3, GFR 30-59 ml/min (HCC)      PLAN:  In order of problems listed above:  Paroxysmal atrial fibrillation with chads vas score equals 2 on Eliquis but had to hold a day for the cardiac catheterization.  Heart rate still going a little fast.  Will increase Toprol to 50 mg once  daily.  Bring him back to mid August for cardioversion scheduling.  Nonischemic cardiomyopathy ejection fraction 20 to 25% on echo doing better on Lasix.  Hopefully will improve once converted to normal  sinus rhythm.  2 g sodium diet.  Check renal function today.  Essential hypertension blood pressure controlled  Chronic systolic CHF compensated.  Patient would like to try to reduce his Lasix dose.  I told him he could try 20 mg once daily but he does eat out regularly so he will have to monitor this and increase back to 40 if he has any increased shortness of breath or edema.  CKD stage III last creatinine 1.58.  We will repeat today.  Medication Adjustments/Labs and Tests Ordered: Current medicines are reviewed at length with the patient today.  Concerns regarding medicines are outlined above.  Medication changes, Labs and Tests ordered today are listed in the Patient Instructions below. Patient Instructions   Medication Instructions:  1. DECREASE LASIX TO ONE-HALF TABLET (20 MG) DAILY  2. INCREASE METOPROLOL TO 50 MG DAILY.    Labwork: NONE ORDERED   Testing/Procedures: NONE ORDERED   Follow-Up: Your physician recommends that you schedule a follow-up appointment WITH DR. Radford Pax OR Ermalinda Barrios, NP IN [place].   Any Other Special Instructions Will Be Listed Below (If Applicable).     If you need a refill on your cardiac medications before your next appointment, please call your pharmacy.  Two Gram Sodium Diet 2000 mg  What is Sodium? Sodium is a mineral found naturally in many foods. The most significant source of sodium in the diet is table salt, which is about 40% sodium.  Processed, convenience, and preserved foods also contain a large amount of sodium.  The body needs only 500 mg of sodium daily to function,  A normal diet provides more than enough sodium even if you do not use salt.  Why Limit Sodium? A build up of sodium in the body can cause thirst, increased blood  pressure, shortness of breath, and water retention.  Decreasing sodium in the diet can reduce edema and risk of heart attack or stroke associated with high blood pressure.  Keep in mind that there are many other factors involved in these health problems.  Heredity, obesity, lack of exercise, cigarette smoking, stress and what you eat all play a role.  General Guidelines:  Do not add salt at the table or in cooking.  One teaspoon of salt contains over 2 grams of sodium.  Read food labels  Avoid processed and convenience foods  Ask your dietitian before eating any foods not dicussed in the menu planning guidelines  Consult your physician if you wish to use a salt substitute or a sodium containing medication such as antacids.  Limit milk and milk products to 16 oz (2 cups) per day.  Shopping Hints:  READ LABELS!! "Dietetic" does not necessarily mean low sodium.  Salt and other sodium ingredients are often added to foods during processing.   Menu Planning Guidelines Food Group Choose More Often Avoid  Beverages (see also the milk group All fruit juices, low-sodium, salt-free vegetables juices, low-sodium carbonated beverages Regular vegetable or tomato juices, commercially softened water used for drinking or cooking  Breads and Cereals Enriched white, wheat, rye and pumpernickel bread, hard rolls and dinner rolls; muffins, cornbread and waffles; most dry cereals, cooked cereal without added salt; unsalted crackers and breadsticks; low sodium or homemade bread crumbs Bread, rolls and crackers with salted tops; quick breads; instant hot cereals; pancakes; commercial bread stuffing; self-rising flower and biscuit mixes; regular bread crumbs or cracker crumbs  Desserts and Sweets Desserts and sweets mad with mild should be within allowance Instant pudding mixes  and cake mixes  Fats Butter or margarine; vegetable oils; unsalted salad dressings, regular salad dressings limited to 1 Tbs; light, sour  and heavy cream Regular salad dressings containing bacon fat, bacon bits, and salt pork; snack dips made with instant soup mixes or processed cheese; salted nuts  Fruits Most fresh, frozen and canned fruits Fruits processed with salt or sodium-containing ingredient (some dried fruits are processed with sodium sulfites        Vegetables Fresh, frozen vegetables and low- sodium canned vegetables Regular canned vegetables, sauerkraut, pickled vegetables, and others prepared in brine; frozen vegetables in sauces; vegetables seasoned with ham, bacon or salt pork  Condiments, Sauces, Miscellaneous  Salt substitute with physician's approval; pepper, herbs, spices; vinegar, lemon or lime juice; hot pepper sauce; garlic powder, onion powder, low sodium soy sauce (1 Tbs.); low sodium condiments (ketchup, chili sauce, mustard) in limited amounts (1 tsp.) fresh ground horseradish; unsalted tortilla chips, pretzels, potato chips, popcorn, salsa (1/4 cup) Any seasoning made with salt including garlic salt, celery salt, onion salt, and seasoned salt; sea salt, rock salt, kosher salt; meat tenderizers; monosodium glutamate; mustard, regular soy sauce, barbecue, sauce, chili sauce, teriyaki sauce, steak sauce, Worcestershire sauce, and most flavored vinegars; canned gravy and mixes; regular condiments; salted snack foods, olives, picles, relish, horseradish sauce, catsup   Food preparation: Try these seasonings Meats:    Pork Sage, onion Serve with applesauce  Chicken Poultry seasoning, thyme, parsley Serve with cranberry sauce  Lamb Curry powder, rosemary, garlic, thyme Serve with mint sauce or jelly  Veal Marjoram, basil Serve with current jelly, cranberry sauce  Beef Pepper, bay leaf Serve with dry mustard, unsalted chive butter  Fish [place] leaf, dill Serve with unsalted lemon butter, unsalted parsley butter  Vegetables:    Asparagus Lemon juice   Broccoli Lemon juice   Carrots Mustard dressing parsley, mint,  nutmeg, glazed with unsalted butter and sugar   Green beans Marjoram, lemon juice, nutmeg,dill seed   Tomatoes Basil, marjoram, onion   Spice /blend for [employer]" 4 tsp ground thyme 1 tsp ground sage 3 tsp ground rosemary 4 tsp ground marjoram   Test your knowledge 1. A product that says "Salt Free" may still contain sodium. True or False 2. Garlic Powder and Hot Pepper Sauce an be used as alternative seasonings.True or False 3. Processed foods have more sodium than fresh foods.  True or False 4. Canned Vegetables have less sodium than froze True or False  WAYS TO DECREASE YOUR SODIUM INTAKE 1. Avoid the use of added salt in cooking and at the table.  Table salt (and other prepared seasonings which contain salt) is probably one of the greatest sources of sodium in the diet.  Unsalted foods can gain flavor from the sweet, sour, and butter taste sensations of herbs and spices.  Instead of using salt for seasoning, try the following seasonings with the foods listed.  Remember: how you use them to enhance natural food flavors is limited only by your creativity... Allspice-Meat, fish, eggs, fruit, peas, red and yellow vegetables Almond Extract-Fruit baked goods Anise Seed-Sweet breads, fruit, carrots, beets, cottage cheese, cookies (tastes like licorice) Basil-Meat, fish, eggs, vegetables, rice, vegetables salads, soups, sauces Bay Leaf-Meat, fish, stews, poultry Burnet-Salad, vegetables (cucumber-like flavor) Caraway Seed-Bread, cookies, cottage cheese, meat, vegetables, cheese, rice Cardamon-Baked goods, fruit, soups Celery Powder or seed-Salads, salad dressings, sauces, meatloaf, soup, bread.Do not use  celery salt Chervil-Meats, salads, fish, eggs, vegetables, cottage cheese (parsley-like flavor) Chili Power-Meatloaf, chicken cheese,  corn, eggplant, egg dishes Chives-Salads cottage cheese, egg dishes, soups, vegetables, sauces Cilantro-Salsa, casseroles Cinnamon-Baked goods, fruit,  pork, lamb, chicken, carrots Cloves-Fruit, baked goods, fish, pot roast, green beans, beets, carrots Coriander-Pastry, cookies, meat, salads, cheese (lemon-orange flavor) Cumin-Meatloaf, fish,cheese, eggs, cabbage,fruit pie (caraway flavor) [employer], fruit, eggs, fish, poultry, cottage cheese, vegetables Dill Seed-Meat, cottage cheese, poultry, vegetables, fish, salads, bread Fennel Seed-Bread, cookies, apples, pork, eggs, fish, beets, cabbage, cheese, Licorice-like flavor Garlic-(buds or powder) Salads, meat, poultry, fish, bread, butter, vegetables, potatoes.Do not  use garlic salt Ginger-Fruit, vegetables, baked goods, meat, fish, poultry Horseradish Root-Meet, vegetables, butter Lemon Juice or Extract-Vegetables, fruit, tea, baked goods, fish salads Mace-Baked goods fruit, vegetables, fish, poultry (taste like nutmeg) Maple Extract-Syrups Marjoram-Meat, chicken, fish, vegetables, breads, green salads (taste like Sage) Mint-Tea, lamb, sherbet, vegetables, desserts, carrots, cabbage Mustard, Dry or Seed-Cheese, eggs, meats, vegetables, poultry Nutmeg-Baked goods, fruit, chicken, eggs, vegetables, desserts Onion Powder-Meat, fish, poultry, vegetables, cheese, eggs, bread, rice salads (Do not use   Onion salt) Orange Extract-Desserts, baked goods Oregano-Pasta, eggs, cheese, onions, pork, lamb, fish, chicken, vegetables, green salads Paprika-Meat, fish, poultry, eggs, cheese, vegetables Parsley Flakes-Butter, vegetables, meat fish, poultry, eggs, bread, salads (certain forms may   Contain sodium Pepper-Meat fish, poultry, vegetables, eggs Peppermint Extract-Desserts, baked goods Poppy Seed-Eggs, bread, cheese, fruit dressings, baked goods, noodles, vegetables, cottage  [employer], poultry, meat, fish, cauliflower, turnips,eggs bread Saffron-Rice, bread, veal, chicken, fish, eggs Sage-Meat, fish, poultry, onions, eggplant, tomateos, pork,  stews Savory-Eggs, salads, poultry, meat, rice, vegetables, soups, pork Tarragon-Meat, poultry, fish, eggs, butter, vegetables (licorice-like flavor)  Thyme-Meat, poultry, fish, eggs, vegetables, (clover-like flavor), sauces, soups Tumeric-Salads, butter, eggs, fish, rice, vegetables (saffron-like flavor) Vanilla Extract-Baked goods, candy Vinegar-Salads, vegetables, meat marinades Walnut Extract-baked goods, candy  2. Choose your Foods Wisely   The following is a list of foods to avoid which are high in sodium:  Meats-Avoid all smoked, canned, salt cured, dried and kosher meat and fish as well as Anchovies   [employer] meats:Bologna, Liverwurst, Pastrami Canned meat or fish  Marinated herring Caviar    Pepperoni Corned Beef   Pizza Dried chipped beef  Salami Frozen breaded fish or meat Salt pork Frankfurters or hot dogs  Sardines Gefilte fish   Sausage Ham (boiled ham, Proscuitto Smoked butt    spiced ham)   Spam      TV Dinners Vegetables Canned vegetables (Regular) Relish Canned mushrooms  Sauerkraut Olives    Tomato juice Pickles  Bakery and Dessert Products Canned puddings  Cream pies Cheesecake   Decorated cakes Cookies  Beverages/Juices Tomato juice, regular  Gatorade   V-8 vegetable juice, regular  Breads and Cereals Biscuit mixes   Salted potato chips, corn chips, pretzels Bread stuffing mixes  Salted crackers and rolls Pancake and waffle mixes Self-rising flour  Seasonings Accent    Meat sauces Barbecue sauce  Meat tenderizer Catsup    Monosodium glutamate (MSG) Celery salt   Onion salt Chili sauce   Prepared mustard Garlic salt   Salt, seasoned salt, sea salt Gravy mixes   Soy sauce Horseradish   Steak sauce Ketchup   Tartar sauce Lite salt    Teriyaki sauce Marinade mixes   Worcestershire sauce  Others Baking powder   Cocoa and cocoa mixes Baking soda   Commercial casserole mixes Candy-caramels, chocolate  Dehydrated soups    Bars,  fudge,nougats  Instant rice and pasta mixes Canned broth or soup  Maraschino cherries Cheese, aged and  processed cheese and cheese spreads  Learning Assessment Quiz  Indicated T (for True) or F (for False) for each of the following statements:  1. _____ Fresh fruits and vegetables and unprocessed grains are generally low in sodium 2. _____ Water may contain a considerable amount of sodium, depending on the source 3. _____ You can always tell if a food is high in sodium by tasting it 4. _____ Certain laxatives my be high in sodium and should be avoided unless prescribed   by a physician or pharmacist 5. _____ Salt substitutes may be used freely by anyone on a sodium restricted diet 6. _____ Sodium is present in table salt, food additives and as a natural component of   most foods 7. _____ Table salt is approximately 90% sodium 8. _____ Limiting sodium intake may help prevent excess fluid accumulation in the body 9. _____ On a sodium-restricted diet, seasonings such as bouillon soy sauce, and    cooking wine should be used in place of table salt 10. _____ On an ingredient list, a product which lists monosodium glutamate as the first   ingredient is an appropriate food to include on a low sodium diet  Circle the best answer(s) to the following statements (Hint: there may be more than one correct answer)  11. On a low-sodium diet, some acceptable snack items are:    A. Olives  F. Bean dip   K. Grapefruit juice    B. Salted Pretzels G. Commercial Popcorn   L. Canned peaches    C. Carrot Sticks  H. Bouillon   M. Unsalted nuts   D. [place] fries  I. Peanut butter crackers N. Salami   E. Sweet pickles J. Tomato Juice   O. Pizza  12.  Seasonings that may be used freely on a reduced - sodium diet include   A. Lemon wedges F.Monosodium glutamate K. Celery seed    B.Soysauce   G. Pepper   L. Mustard powder   C. Sea salt  H. Cooking wine  M. Onion flakes   D. Vinegar  E. Prepared  horseradish N. Salsa   E. Sage   J. Worcestershire sauce  O. Chutney      Signed, Ermalinda Barrios, PA-C  02/02/2018 2:42 PM    [facility] [address], [place], [place]  [address] Phone: [phone]; Fax: [phone]

## 2018-02-03 LAB — BASIC METABOLIC PANEL
BUN/Creatinine Ratio: 13 (ref 10–24)
BUN: 20 mg/dL (ref 8–27)
CALCIUM: 9.2 mg/dL (ref 8.6–10.2)
CO2: 27 mmol/L (ref 20–29)
Chloride: 103 mmol/L (ref 96–106)
Creatinine, Ser: 1.5 mg/dL — ABNORMAL HIGH (ref 0.76–1.27)
GFR calc Af Amer: 54 mL/min/{1.73_m2} — ABNORMAL LOW (ref >59)
GFR calc non Af Amer: 46 mL/min/{1.73_m2} — ABNORMAL LOW (ref >59)
Glucose: 90 mg/dL (ref 65–99)
Potassium: 4.6 mmol/L (ref 3.5–5.2)
Sodium: 143 mmol/L (ref 134–144)

## 2018-02-03 LAB — PRO B NATRIURETIC PEPTIDE: NT-Pro BNP: 4325 pg/mL — ABNORMAL HIGH (ref 0–376)

## 2018-02-04 ENCOUNTER — Telehealth: Payer: Self-pay | Admitting: *Deleted

## 2018-02-04 ENCOUNTER — Telehealth: Payer: Self-pay | Admitting: Cardiology

## 2018-02-04 MED ORDER — FUROSEMIDE 40 MG PO TABS: 40.0000 mg | ORAL_TABLET | Freq: Every day | ORAL | 3 refills | Status: DC

## 2018-02-04 NOTE — Telephone Encounter (Signed)
error 

## 2018-02-04 NOTE — Telephone Encounter (Signed)
-----   Message from Imogene Burn, [place] sent at 02/03/2018  2:19 PM EDT ----- Can you let the patient know that his heart failure marker is elevated so he needs to stay on Lasix 40 mg and not drop down to the 20 mg. This could be in part that his heart rate was still going a bit fast. Also reiterate the importance of a 2 gm sodium diet as that eat out a lot. Have him call if he is short of breath, has swelling or heart rate remains fast.

## 2018-02-04 NOTE — Telephone Encounter (Signed)
Follow Up: ° ° ° ° ° °Returning your call from yesterday, concerning his lab results. °

## 2018-02-24 DIAGNOSIS — T63461D Toxic effect of venom of wasps, accidental (unintentional), subsequent encounter: Secondary | ICD-10-CM | POA: Diagnosis not present

## 2018-02-24 DIAGNOSIS — T63451D Toxic effect of venom of hornets, accidental (unintentional), subsequent encounter: Secondary | ICD-10-CM | POA: Diagnosis not present

## 2018-02-24 DIAGNOSIS — T63441D Toxic effect of venom of bees, accidental (unintentional), subsequent encounter: Secondary | ICD-10-CM | POA: Diagnosis not present

## 2018-03-08 ENCOUNTER — Encounter: Payer: Self-pay | Admitting: Physician Assistant

## 2018-03-08 ENCOUNTER — Ambulatory Visit (INDEPENDENT_AMBULATORY_CARE_PROVIDER_SITE_OTHER): Payer: Medicare Other | Admitting: Physician Assistant

## 2018-03-08 ENCOUNTER — Encounter: Payer: Self-pay | Admitting: *Deleted

## 2018-03-08 VITALS — BP 104/68 | HR 102 | Ht 72.0 in | Wt 191.0 lb

## 2018-03-08 DIAGNOSIS — I480 Paroxysmal atrial fibrillation: Secondary | ICD-10-CM

## 2018-03-08 DIAGNOSIS — N183 Chronic kidney disease, stage 3 unspecified: Secondary | ICD-10-CM

## 2018-03-08 DIAGNOSIS — I5022 Chronic systolic (congestive) heart failure: Secondary | ICD-10-CM

## 2018-03-08 DIAGNOSIS — I10 Essential (primary) hypertension: Secondary | ICD-10-CM | POA: Diagnosis not present

## 2018-03-08 DIAGNOSIS — I428 Other cardiomyopathies: Secondary | ICD-10-CM | POA: Diagnosis not present

## 2018-03-08 MED ORDER — METOPROLOL SUCCINATE ER 25 MG PO TB24: 25.0000 mg | ORAL_TABLET | Freq: Every day | ORAL | 1 refills | Status: DC

## 2018-03-08 MED ORDER — LOSARTAN POTASSIUM 100 MG PO TABS: 50.0000 mg | ORAL_TABLET | Freq: Every day | ORAL | 3 refills | Status: DC

## 2018-03-08 NOTE — Patient Instructions (Addendum)
Medication Instructions:  Your physician has recommended you make the following change in your medication:  1.  INCREASE the Metoprolol to 75 mg daily 2.  DECREASE Losartan to 1/2 tablet daily (50 mg)  Labwork: TODAY:  PRO BNP, BMET & CBC  Testing/Procedures: Your physician has recommended that you have a Cardioversion (DCCV). Electrical Cardioversion uses a jolt of electricity to your heart either through paddles or wired patches attached to your chest. This is a controlled, usually prescheduled, procedure. Defibrillation is done under light anesthesia in the hospital, and you usually go home the day of the procedure. This is done to get your heart back into a normal rhythm. You are not awake for the procedure. Please see the instruction sheet given to you today.    Follow-Up: Your physician recommends that you schedule a follow-up appointment in: 2 WEEKS FROM 03/11/18 WITH MICHELE LENZE, PA-C   Any Other Special Instructions Will Be Listed Below (If Applicable).   Electrical Cardioversion Electrical cardioversion is the delivery of a jolt of electricity to restore a normal rhythm to the heart. A rhythm that is too fast or is not regular keeps the heart from pumping well. In this procedure, sticky patches or metal paddles are placed on the chest to deliver electricity to the heart from a device. This procedure may be done in an emergency if:  There is low or no blood pressure as a result of the heart rhythm.  Normal rhythm must be restored as fast as possible to protect the brain and heart from further damage.  It may save a life.  This procedure may also be done for irregular or fast heart rhythms that are not immediately life-threatening. Tell a health care provider about:  Any allergies you have.  All medicines you are taking, including vitamins, herbs, eye drops, creams, and over-the-counter medicines.  Any problems you or family members have had with anesthetic  medicines.  Any blood disorders you have.  Any surgeries you have had.  Any medical conditions you have.  Whether you are pregnant or may be pregnant. What are the risks? Generally, this is a safe procedure. However, problems may occur, including:  Allergic reactions to medicines.  A blood clot that breaks free and travels to other parts of your body.  The possible return of an abnormal heart rhythm within hours or days after the procedure.  Your heart stopping (cardiac arrest). This is rare.  What happens before the procedure? Medicines  Your health care provider may have you start taking: ? Blood-thinning medicines (anticoagulants) so your blood does not clot as easily. ? Medicines may be given to help stabilize your heart rate and rhythm.  Ask your health care provider about changing or stopping your regular medicines. This is especially important if you are taking diabetes medicines or blood thinners. General instructions  Plan to have someone take you home from the hospital or clinic.  If you will be going home right after the procedure, plan to have someone with you for 24 hours.  Follow instructions from your health care provider about eating or drinking restrictions. What happens during the procedure?  To lower your risk of infection: ? Your health care team will wash or sanitize their hands. ? Your skin will be washed with soap.  An IV tube will be inserted into one of your veins.  You will be given a medicine to help you relax (sedative).  Sticky patches (electrodes) or metal paddles may be placed on your  chest.  An electrical shock will be delivered. The procedure may vary among health care providers and hospitals. What happens after the procedure?  Your blood pressure, heart rate, breathing rate, and blood oxygen level will be monitored until the medicines you were given have worn off.  Do not drive for 24 hours if you were given a sedative.  Your  heart rhythm will be watched to make sure it does not change. This information is not intended to replace advice given to you by your health care provider. Make sure you discuss any questions you have with your health care provider. Document Released: 06/19/2002 Document Revised: 02/26/2016 Document Reviewed: 01/03/2016 Elsevier Interactive Patient Education  2017 [employer].    If you need a refill on your cardiac medications before your next appointment, please call your pharmacy.

## 2018-03-08 NOTE — H&P (View-Only) (Signed)
 Cardiology Office Note    Date:  03/08/2018   ID:  John Cobb, DOB 05/30/1946, MRN [id number]  PCP:  Little, Kevin, MD  Cardiologist: Traci Turner, MD  Chief Complaint  Patient presents with  . Follow-up    History of Present Illness:  John Cobb is a 72 y.o. male  with history of hypertension, hyperlipidemia and newly diagnosed atrial fibrillation CVR on Toprol and Eliquis for CHA2DS2-VASc of 2.  2D echo performed and showed a severe LV dysfunction ejection fraction 20 to 25% with severe diffuse hypokinesis.  Stress test EF 19% with findings of ischemia high risk study that led to cardiac catheterization.  Cardiac cath showed nonobstructive CAD and he was felt to have nonischemic cardiomyopathy possibly related to A. fib and tachycardia induced mechanism.   I saw the patient 02/02/2018 which time he says his breathing had improved with Lasix.  They were eating out every day.  His heart rate was still going fast at 130 bpm times at home.  I increase his Toprol to 50 mg daily.  His Eliquis was held 1 day for cardiac catheterization so we had to wait 4 weeks to schedule cardioversion.  BNP was still elevated at 4,325.  Patient had wanted to reduce his Lasix but I asked him to stay on 40 mg daily.  Suspect that was secondary to tachycardia.  Creatinine was 1.5.  Patient comes in today for follow-up accompanied by his wife.  His blood pressure has been running low.  His heart rate has come down some but it still going to fast.  He is complaining of chest pressure.  Has cut back on his salt intake.    Past Medical History:  Diagnosis Date  . Arthritis   . Benign essential HTN 01/05/2018  . Blood transfusion    As an infant  . Dislocated IOL (intraocular lens), posterior 10/02/2011  . High cholesterol   . Hypertension   . Paroxysmal atrial fibrillation (HCC) 01/05/2018  . Pneumonia ~ 1978  . SOB (shortness of breath) 01/05/2018    Past Surgical History:  Procedure Laterality  Date  . ANTERIOR CERVICAL DISCECTOMY  2008  . APPENDECTOMY     "as an infant"  . CATARACT EXTRACTION W/ INTRAOCULAR LENS IMPLANT  08/13/11   left  . CATARACT EXTRACTION W/ INTRAOCULAR LENS IMPLANT  09/03/11   right  . Colonscopy    . Compound Fractures  06/1980   right arm; repaired with 2 plates and 10 pins.  . FRACTURE SURGERY    . GAS/FLUID EXCHANGE  10/20/2011   Procedure: GAS/FLUID EXCHANGE;  Surgeon: John D Matthews, MD;  Location: [facility] OR;  Service: Ophthalmology;  Laterality: Right;  . HERNIA REPAIR     "umbilical; as an infant"  . INTRAOCULAR LENS EXCHANGE  10/20/11   right eye  . PARS PLANA VITRECTOMY  10/20/2011   Procedure: PARS PLANA VITRECTOMY WITH 25G REMOVAL/SUTURE INTRAOCULAR LENS;  Surgeon: John D Matthews, MD;  Location: [facility] OR;  Service: Ophthalmology;  Laterality: Right;  . RIGHT/LEFT HEART CATH AND CORONARY ANGIOGRAPHY N/A 01/20/2018   Procedure: RIGHT/LEFT HEART CATH AND CORONARY ANGIOGRAPHY;  Surgeon: Smith, Henry W, MD;  Location: [facility] INVASIVE CV LAB;  Service: Cardiovascular;  Laterality: N/A;    Current Medications: Current Meds  Medication Sig  . apixaban (ELIQUIS) 5 MG TABS tablet Take 1 tablet (5 mg total) by mouth 2 (two) times daily.  . fish oil-omega-3 fatty acids 1000 MG capsule Take 1 g by mouth   daily.   . furosemide (LASIX) 40 MG tablet Take 1 tablet (40 mg total) by mouth daily.  . metoprolol succinate (TOPROL-XL) 50 MG 24 hr tablet Take 1 tablet (50 mg total) by mouth daily.  . Multiple Vitamin (MULITIVITAMIN WITH MINERALS) TABS Take 1 tablet by mouth daily.  . pravastatin (PRAVACHOL) 80 MG tablet Take 80 mg by mouth daily.  . tamsulosin (FLOMAX) 0.4 MG CAPS capsule Take 0.4 mg by mouth daily.  . triamcinolone cream (KENALOG) 0.1 % Apply 1 application topically 2 (two) times daily.  . [DISCONTINUED] losartan (COZAAR) 100 MG tablet Take 100 mg by mouth daily.     Allergies:   Neosporin [neomycin-bacitracin zn-polymyx]   Social History   Socioeconomic  History  . Marital status: Married    Spouse name: Not on file  . Number of children: Not on file  . Years of education: Not on file  . Highest education level: Not on file  Occupational History  . Not on file  Social Needs  . Financial resource strain: Not on file  . Food insecurity:    Worry: Not on file    Inability: Not on file  . Transportation needs:    Medical: Not on file    Non-medical: Not on file  Tobacco Use  . Smoking status: Never Smoker  . Smokeless tobacco: Former User    Types: Chew  Substance and Sexual Activity  . Alcohol use: No  . Drug use: No  . Sexual activity: Not Currently  Lifestyle  . Physical activity:    Days per week: Not on file    Minutes per session: Not on file  . Stress: Not on file  Relationships  . Social connections:    Talks on phone: Not on file    Gets together: Not on file    Attends religious service: Not on file    Active member of club or organization: Not on file    Attends meetings of clubs or organizations: Not on file    Relationship status: Not on file  Other Topics Concern  . Not on file  Social History Narrative  . Not on file     Family History:  The patient's family history includes Heart attack in his father; Lung cancer in his mother; Stomach cancer in his mother.   ROS:   Please see the history of present illness.    Review of Systems  Cardiovascular: Positive for chest pain and irregular heartbeat.  Respiratory: Positive for cough.   Neurological: Positive for light-headedness.  Psychiatric/Behavioral: The patient is nervous/anxious.    All other systems reviewed and are negative.   PHYSICAL EXAM:   VS:  BP 104/68   Pulse (!) 102   Ht 6' (1.829 m)   Wt 191 lb (86.6 kg)   SpO2 98%   BMI 25.90 kg/m   Physical Exam  GEN: Well nourished, well developed, in no acute distress  Neck: no JVD, carotid bruits, or masses Cardiac: Irregular irregular; no murmurs, rubs, or gallops  Respiratory:  clear to  auscultation bilaterally, normal work of breathing GI: soft, nontender, nondistended, + BS Ext: without cyanosis, clubbing, or edema, Good distal pulses bilaterally Neuro:  Alert and Oriented x 3 Psych: euthymic mood, full affect  Wt Readings from Last 3 Encounters:  03/08/18 191 lb (86.6 kg)  02/02/18 188 lb (85.3 kg)  01/20/18 185 lb (83.9 kg)      Studies/Labs Reviewed:   EKG:  EKG is ordered today.    The ekg ordered today demonstrates atrial fibrillation at 102 bpm  Recent Labs: 01/11/2018: BNP 1,014.7 01/20/2018: Hemoglobin 15.5; Platelets 235 02/02/2018: BUN 20; Creatinine, Ser 1.50; NT-Pro BNP 4,325; Potassium 4.6; Sodium 143   Lipid Panel No results found for: CHOL, TRIG, HDL, CHOLHDL, VLDL, LDLCALC, LDLDIRECT  Additional studies/ records that were reviewed today include:  Nuclear stress test 01/17/2018  The left ventricular ejection fraction is severely decreased (<30%).  Nuclear stress EF: 19%.  There was no ST segment deviation noted during stress.  Defect 1: There is a medium defect of moderate severity present in the basal inferior, basal inferolateral, mid inferior and apical inferior location.  Findings consistent with ischemia.  This is a high risk study.   This is an abnormal stress test, there is a medium size, moderate severity reversible defect in the basal and mid inferior and inferolateral walls, mid and apical inferior walls consistent with ischemia in the LCX territory. (SDS =5). There is severe left ventricular dysfunction with LVEF 15% with diffuse hypokinesis.  2D echo 01/17/2018 Study Conclusions   - Left ventricle: The cavity size was mildly dilated. Systolic   function was severely reduced. The estimated ejection fraction   was in the range of 20% to 25%. Severe diffuse hypokinesis with   regional variations. The study was not technically sufficient to   allow evaluation of LV diastolic dysfunction due to atrial   fibrillation. Doppler parameters  are consistent with high   ventricular filling pressure. - Aortic valve: Trileaflet; mildly thickened, mildly calcified   leaflets. There was trivial regurgitation. - Mitral valve: Calcified annulus. There was trivial regurgitation. - Left atrium: The atrium was mildly dilated. - Tricuspid valve: There was trivial regurgitation. - Pulmonary arteries: PA peak pressure: 42 mm Hg (S).   Impressions:   - The right ventricular systolic pressure was increased consistent   with mild pulmonary hypertension. Cardiac catheterization 01/20/2018   Normal left main  Proximal to mid LAD 30% eccentric narrowing at the bifurcation with a large diagonal.  The first diagonal contains 30% eccentric stenosis.  The  Circumflex origin to 2 obtuse marginal branches, both of which are widely patent.  RCA is not well-visualized.  Ostial and mid vessel luminal irregularities.  No significant obstruction.  Normal pulmonary pressures.  LVEDP 17 mmHg.  Pulmonary capillary wedge mean is 18 to 20 mmHg.  Nonischemic cardiomyopathy, possibly related to atrial fibrillation and tachycardia induced mechanism.   RECOMMENDATIONS:    Resume apixaban in a.m.  Further management per Dr. Turner         ASSESSMENT:    1. Paroxysmal atrial fibrillation (HCC)   2. Nonischemic cardiomyopathy (HCC)   3. Chronic systolic heart failure (HCC)   4. Benign essential HTN   5. CKD (chronic kidney disease) stage 3, GFR 30-59 ml/min (HCC)      PLAN:  In order of problems listed above:  PAF CHA2DS2-VASc score equals 2 on Eliquis.  Eliquis was held for cardiac catheterization 01/20/2018 but has not missed a dose since.  Will scheduled for cardioversion this Friday.  Patient is having trouble affording Eliquis.  Trying to get him assistance.  Rate still up.  Increase Toprol to 75 mg once daily.  Nonischemic cardiomyopathy LVEF 20 to 25% BNP was elevated last office visit.  Hopefully this will improve with normal sinus  rhythm.  Has cut back on his sodium.  Chronic systolic heart failure BNP was elevated over 4000 last office visit but patient was feeling better.    No significant heart failure on exam.  Continue Lasix 40 mg once daily.  Follow-up be met today.  Benign essential hypertension blood pressure running well.  Will decrease losartan to 50 mg once daily.  CKD creatinine was 1.5-recheck today.  Medication Adjustments/Labs and Tests Ordered: Current medicines are reviewed at length with the patient today.  Concerns regarding medicines are outlined above.  Medication changes, Labs and Tests ordered today are listed in the Patient Instructions below. Patient Instructions  Medication Instructions:  Your physician has recommended you make the following change in your medication:    Labwork: TODAY:  BMET & CBC  Testing/Procedures: Your physician has recommended that you have a Cardioversion (DCCV). Electrical Cardioversion uses a jolt of electricity to your heart either through paddles or wired patches attached to your chest. This is a controlled, usually prescheduled, procedure. Defibrillation is done under light anesthesia in the hospital, and you usually go home the day of the procedure. This is done to get your heart back into a normal rhythm. You are not awake for the procedure. Please see the instruction sheet given to you today.    Follow-Up: Your physician recommends that you schedule a follow-up appointment in: 2 WEEKS FROM 03/11/18 WITH Jeury Mcnab, PA-C   Any Other Special Instructions Will Be Listed Below (If Applicable).   Electrical Cardioversion Electrical cardioversion is the delivery of a jolt of electricity to restore a normal rhythm to the heart. A rhythm that is too fast or is not regular keeps the heart from pumping well. In this procedure, sticky patches or metal paddles are placed on the chest to deliver electricity to the heart from a device. This procedure may be done in an  emergency if:  There is low or no blood pressure as a result of the heart rhythm.  Normal rhythm must be restored as fast as possible to protect the brain and heart from further damage.  It may save a life.  This procedure may also be done for irregular or fast heart rhythms that are not immediately life-threatening. Tell a health care provider about:  Any allergies you have.  All medicines you are taking, including vitamins, herbs, eye drops, creams, and over-the-counter medicines.  Any problems you or family members have had with anesthetic medicines.  Any blood disorders you have.  Any surgeries you have had.  Any medical conditions you have.  Whether you are pregnant or may be pregnant. What are the risks? Generally, this is a safe procedure. However, problems may occur, including:  Allergic reactions to medicines.  A blood clot that breaks free and travels to other parts of your body.  The possible return of an abnormal heart rhythm within hours or days after the procedure.  Your heart stopping (cardiac arrest). This is rare.  What happens before the procedure? Medicines  Your health care provider may have you start taking: ? Blood-thinning medicines (anticoagulants) so your blood does not clot as easily. ? Medicines may be given to help stabilize your heart rate and rhythm.  Ask your health care provider about changing or stopping your regular medicines. This is especially important if you are taking diabetes medicines or blood thinners. General instructions  Plan to have someone take you home from the hospital or clinic.  If you will be going home right after the procedure, plan to have someone with you for 24 hours.  Follow instructions from your health care provider about eating or drinking restrictions. What happens during the procedure?    To lower your risk of infection: ? Your health care team will wash or sanitize their hands. ? Your skin will be  washed with soap.  An IV tube will be inserted into one of your veins.  You will be given a medicine to help you relax (sedative).  Sticky patches (electrodes) or metal paddles may be placed on your chest.  An electrical shock will be delivered. The procedure may vary among health care providers and hospitals. What happens after the procedure?  Your blood pressure, heart rate, breathing rate, and blood oxygen level will be monitored until the medicines you were given have worn off.  Do not drive for 24 hours if you were given a sedative.  Your heart rhythm will be watched to make sure it does not change. This information is not intended to replace advice given to you by your health care provider. Make sure you discuss any questions you have with your health care provider. Document Released: 06/19/2002 Document Revised: 02/26/2016 Document Reviewed: 01/03/2016 Elsevier Interactive Patient Education  2017 [employer]    If you need a refill on your cardiac medications before your next appointment, please call your pharmacy.      Signed, Manley Fason, PA-C  03/08/2018 11:27 AM    [facility] [address], [place], [place]  [address] Phone: [phone]; Fax: [phone]    

## 2018-03-08 NOTE — Progress Notes (Signed)
Cardiology Office Note    Date:  03/08/2018   ID:  John Cobb, DOB 08-May-1946, MRN [id number]  PCP:  John Fess, MD  Cardiologist: John Him, MD  Chief Complaint  Patient presents with  . Follow-up    History of Present Illness:  John Cobb is a 72 y.o. male  with history of hypertension, hyperlipidemia and newly diagnosed atrial fibrillation CVR on Toprol and Eliquis for CHA2DS2-VASc of 2.  2D echo performed and showed a severe LV dysfunction ejection fraction 20 to 25% with severe diffuse hypokinesis.  Stress test EF 19% with findings of ischemia high risk study that led to cardiac catheterization.  Cardiac cath showed nonobstructive CAD and he was felt to have nonischemic cardiomyopathy possibly related to A. fib and tachycardia induced mechanism.   I saw the patient 02/02/2018 which time he says his breathing had improved with Lasix.  They were eating out every day.  His heart rate was still going fast at 130 bpm times at home.  I increase his Toprol to 50 mg daily.  His Eliquis was held 1 day for cardiac catheterization so we had to wait 4 weeks to schedule cardioversion.  BNP was still elevated at 4,325.  Patient had wanted to reduce his Lasix but I asked Cobb to stay on 40 mg daily.  Suspect that was secondary to tachycardia.  Creatinine was 1.5.  Patient comes in today for follow-up accompanied by his wife.  His blood pressure has been running low.  His heart rate has come down some but it still going to fast.  He is complaining of chest pressure.  Has cut back on his salt intake.    Past Medical History:  Diagnosis Date  . Arthritis   . Benign essential HTN 01/05/2018  . Blood transfusion    As an infant  . Dislocated IOL (intraocular lens), posterior 10/02/2011  . High cholesterol   . Hypertension   . Paroxysmal atrial fibrillation ([place]) 01/05/2018  . Pneumonia ~ 1978  . SOB (shortness of breath) 01/05/2018    Past Surgical History:  Procedure Laterality  Date  . ANTERIOR CERVICAL DISCECTOMY  2008  . APPENDECTOMY     "as an infant"  . CATARACT EXTRACTION W/ INTRAOCULAR LENS IMPLANT  08/13/11   left  . CATARACT EXTRACTION W/ INTRAOCULAR LENS IMPLANT  09/03/11   right  . Colonscopy    . Compound Fractures  06/1980   right arm; repaired with 2 plates and 10 pins.  . FRACTURE SURGERY    . GAS/FLUID EXCHANGE  10/20/2011   Procedure: GAS/FLUID EXCHANGE;  Surgeon: Hayden Pedro, MD;  Location: [facility];  Service: Ophthalmology;  Laterality: Right;  . HERNIA REPAIR     "umbilical; as an infant"  . INTRAOCULAR LENS EXCHANGE  10/20/11   right eye  . PARS PLANA VITRECTOMY  10/20/2011   Procedure: PARS PLANA VITRECTOMY WITH 25G REMOVAL/SUTURE INTRAOCULAR LENS;  Surgeon: Hayden Pedro, MD;  Location: [facility];  Service: Ophthalmology;  Laterality: Right;  . RIGHT/LEFT HEART CATH AND CORONARY ANGIOGRAPHY N/A 01/20/2018   Procedure: RIGHT/LEFT HEART CATH AND CORONARY ANGIOGRAPHY;  Surgeon: Belva Crome, MD;  Location: [facility];  Service: Cardiovascular;  Laterality: N/A;    Current Medications: Current Meds  Medication Sig  . apixaban (ELIQUIS) 5 MG TABS tablet Take 1 tablet (5 mg total) by mouth 2 (two) times daily.  . fish oil-omega-3 fatty acids 1000 MG capsule Take 1 g by mouth  daily.   . furosemide (LASIX) 40 MG tablet Take 1 tablet (40 mg total) by mouth daily.  . metoprolol succinate (TOPROL-XL) 50 MG 24 hr tablet Take 1 tablet (50 mg total) by mouth daily.  . Multiple Vitamin (MULITIVITAMIN WITH MINERALS) TABS Take 1 tablet by mouth daily.  . pravastatin (PRAVACHOL) 80 MG tablet Take 80 mg by mouth daily.  . tamsulosin (FLOMAX) 0.4 MG CAPS capsule Take 0.4 mg by mouth daily.  Marland Kitchen triamcinolone cream (KENALOG) 0.1 % Apply 1 application topically 2 (two) times daily.  . [DISCONTINUED] losartan (COZAAR) 100 MG tablet Take 100 mg by mouth daily.     Allergies:   Neosporin [neomycin-bacitracin zn-polymyx]   Social History   Socioeconomic  History  . Marital status: Married    Spouse name: Not on file  . Number of children: Not on file  . Years of education: Not on file  . Highest education level: Not on file  Occupational History  . Not on file  Social Needs  . Financial resource strain: Not on file  . Food insecurity:    Worry: Not on file    Inability: Not on file  . Transportation needs:    Medical: Not on file    Non-medical: Not on file  Tobacco Use  . Smoking status: Never Smoker  . Smokeless tobacco: Former Systems developer    Types: Chew  Substance and Sexual Activity  . Alcohol use: No  . Drug use: No  . Sexual activity: Not Currently  Lifestyle  . Physical activity:    Days per week: Not on file    Minutes per session: Not on file  . Stress: Not on file  Relationships  . Social connections:    Talks on phone: Not on file    Gets together: Not on file    Attends religious service: Not on file    Active member of club or organization: Not on file    Attends meetings of clubs or organizations: Not on file    Relationship status: Not on file  Other Topics Concern  . Not on file  Social History Narrative  . Not on file     Family History:  The patient's family history includes Heart attack in his father; Lung cancer in his mother; Stomach cancer in his mother.   ROS:   Please see the history of present illness.    Review of Systems  Cardiovascular: Positive for chest pain and irregular heartbeat.  Respiratory: Positive for cough.   Neurological: Positive for light-headedness.  Psychiatric/Behavioral: The patient is nervous/anxious.    All other systems reviewed and are negative.   PHYSICAL EXAM:   VS:  BP 104/68   Pulse (!) 102   Ht 6' (1.829 m)   Wt 191 lb (86.6 kg)   SpO2 98%   BMI 25.90 kg/m   Physical Exam  GEN: Well nourished, well developed, in no acute distress  Neck: no JVD, carotid bruits, or masses Cardiac: Irregular irregular; no murmurs, rubs, or gallops  Respiratory:  clear to  auscultation bilaterally, normal work of breathing GI: soft, nontender, nondistended, + BS Ext: without cyanosis, clubbing, or edema, Good distal pulses bilaterally Neuro:  Alert and Oriented x 3 Psych: euthymic mood, full affect  Wt Readings from Last 3 Encounters:  03/08/18 191 lb (86.6 kg)  02/02/18 188 lb (85.3 kg)  01/20/18 185 lb (83.9 kg)      Studies/Labs Reviewed:   EKG:  EKG is ordered today.  The ekg ordered today demonstrates atrial fibrillation at 102 bpm  Recent Labs: 01/11/2018: BNP 1,014.7 01/20/2018: Hemoglobin 15.5; Platelets 235 02/02/2018: BUN 20; Creatinine, Ser 1.50; NT-Pro BNP 4,325; Potassium 4.6; Sodium 143   Lipid Panel No results found for: CHOL, TRIG, HDL, CHOLHDL, VLDL, LDLCALC, LDLDIRECT  Additional studies/ records that were reviewed today include:  Nuclear stress test 01/17/2018  The left ventricular ejection fraction is severely decreased (<30%).  Nuclear stress EF: 19%.  There was no ST segment deviation noted during stress.  Defect 1: There is a medium defect of moderate severity present in the basal inferior, basal inferolateral, mid inferior and apical inferior location.  Findings consistent with ischemia.  This is a high risk study.   This is an abnormal stress test, there is a medium size, moderate severity reversible defect in the basal and mid inferior and inferolateral walls, mid and apical inferior walls consistent with ischemia in the LCX territory. (SDS =5). There is severe left ventricular dysfunction with LVEF 15% with diffuse hypokinesis.  2D echo 01/17/2018 Study Conclusions   - Left ventricle: The cavity size was mildly dilated. Systolic   function was severely reduced. The estimated ejection fraction   was in the range of 20% to 25%. Severe diffuse hypokinesis with   regional variations. The study was not technically sufficient to   allow evaluation of LV diastolic dysfunction due to atrial   fibrillation. Doppler parameters  are consistent with high   ventricular filling pressure. - Aortic valve: Trileaflet; mildly thickened, mildly calcified   leaflets. There was trivial regurgitation. - Mitral valve: Calcified annulus. There was trivial regurgitation. - Left atrium: The atrium was mildly dilated. - Tricuspid valve: There was trivial regurgitation. - Pulmonary arteries: PA peak pressure: 42 mm Hg (S).   Impressions:   - The right ventricular systolic pressure was increased consistent   with mild pulmonary hypertension. Cardiac catheterization 01/20/2018   Normal left main  Proximal to mid LAD 30% eccentric narrowing at the bifurcation with a large diagonal.  The first diagonal contains 30% eccentric stenosis.  The  Circumflex origin to 2 obtuse marginal branches, both of which are widely patent.  RCA is not well-visualized.  Ostial and mid vessel luminal irregularities.  No significant obstruction.  Normal pulmonary pressures.  LVEDP 17 mmHg.  Pulmonary capillary wedge mean is 18 to 20 mmHg.  Nonischemic cardiomyopathy, possibly related to atrial fibrillation and tachycardia induced mechanism.   RECOMMENDATIONS:    Resume apixaban in a.m.  Further management per Dr. Radford Pax         ASSESSMENT:    1. Paroxysmal atrial fibrillation (HCC)   2. Nonischemic cardiomyopathy ([place])   3. Chronic systolic heart failure ([place])   4. Benign essential HTN   5. CKD (chronic kidney disease) stage 3, GFR 30-59 ml/min (HCC)      PLAN:  In order of problems listed above:  PAF CHA2DS2-VASc score equals 2 on Eliquis.  Eliquis was held for cardiac catheterization 01/20/2018 but has not missed a dose since.  Will scheduled for cardioversion this Friday.  Patient is having trouble affording Eliquis.  Trying to get Cobb assistance.  Rate still up.  Increase Toprol to 75 mg once daily.  Nonischemic cardiomyopathy LVEF 20 to 25% BNP was elevated last office visit.  Hopefully this will improve with normal sinus  rhythm.  Has cut back on his sodium.  Chronic systolic heart failure BNP was elevated over 4000 last office visit but patient was feeling better.  No significant heart failure on exam.  Continue Lasix 40 mg once daily.  Follow-up be met today.  Benign essential hypertension blood pressure running well.  Will decrease losartan to 50 mg once daily.  CKD creatinine was 1.5-recheck today.  Medication Adjustments/Labs and Tests Ordered: Current medicines are reviewed at length with the patient today.  Concerns regarding medicines are outlined above.  Medication changes, Labs and Tests ordered today are listed in the Patient Instructions below. Patient Instructions  Medication Instructions:  Your physician has recommended you make the following change in your medication:    Labwork: TODAY:  BMET & CBC  Testing/Procedures: Your physician has recommended that you have a Cardioversion (DCCV). Electrical Cardioversion uses a jolt of electricity to your heart either through paddles or wired patches attached to your chest. This is a controlled, usually prescheduled, procedure. Defibrillation is done under light anesthesia in the hospital, and you usually go home the day of the procedure. This is done to get your heart back into a normal rhythm. You are not awake for the procedure. Please see the instruction sheet given to you today.    Follow-Up: Your physician recommends that you schedule a follow-up appointment in: 2 WEEKS FROM 03/11/18 WITH Gerene Nedd, PA-C   Any Other Special Instructions Will Be Listed Below (If Applicable).   Electrical Cardioversion Electrical cardioversion is the delivery of a jolt of electricity to restore a normal rhythm to the heart. A rhythm that is too fast or is not regular keeps the heart from pumping well. In this procedure, sticky patches or metal paddles are placed on the chest to deliver electricity to the heart from a device. This procedure may be done in an  emergency if:  There is low or no blood pressure as a result of the heart rhythm.  Normal rhythm must be restored as fast as possible to protect the brain and heart from further damage.  It may save a life.  This procedure may also be done for irregular or fast heart rhythms that are not immediately life-threatening. Tell a health care provider about:  Any allergies you have.  All medicines you are taking, including vitamins, herbs, eye drops, creams, and over-the-counter medicines.  Any problems you or family members have had with anesthetic medicines.  Any blood disorders you have.  Any surgeries you have had.  Any medical conditions you have.  Whether you are pregnant or may be pregnant. What are the risks? Generally, this is a safe procedure. However, problems may occur, including:  Allergic reactions to medicines.  A blood clot that breaks free and travels to other parts of your body.  The possible return of an abnormal heart rhythm within hours or days after the procedure.  Your heart stopping (cardiac arrest). This is rare.  What happens before the procedure? Medicines  Your health care provider may have you start taking: ? Blood-thinning medicines (anticoagulants) so your blood does not clot as easily. ? Medicines may be given to help stabilize your heart rate and rhythm.  Ask your health care provider about changing or stopping your regular medicines. This is especially important if you are taking diabetes medicines or blood thinners. General instructions  Plan to have someone take you home from the hospital or clinic.  If you will be going home right after the procedure, plan to have someone with you for 24 hours.  Follow instructions from your health care provider about eating or drinking restrictions. What happens during the procedure?  To lower your risk of infection: ? Your health care team will wash or sanitize their hands. ? Your skin will be  washed with soap.  An IV tube will be inserted into one of your veins.  You will be given a medicine to help you relax (sedative).  Sticky patches (electrodes) or metal paddles may be placed on your chest.  An electrical shock will be delivered. The procedure may vary among health care providers and hospitals. What happens after the procedure?  Your blood pressure, heart rate, breathing rate, and blood oxygen level will be monitored until the medicines you were given have worn off.  Do not drive for 24 hours if you were given a sedative.  Your heart rhythm will be watched to make sure it does not change. This information is not intended to replace advice given to you by your health care provider. Make sure you discuss any questions you have with your health care provider. Document Released: 06/19/2002 Document Revised: 02/26/2016 Document Reviewed: 01/03/2016 Elsevier Interactive Patient Education  2017 [employer].    If you need a refill on your cardiac medications before your next appointment, please call your pharmacy.      Sumner Boast, PA-C  03/08/2018 11:27 AM    [facility] [address], [place], [place]  [address] Phone: [phone]; Fax: [phone]

## 2018-03-09 ENCOUNTER — Telehealth: Payer: Self-pay | Admitting: *Deleted

## 2018-03-09 ENCOUNTER — Other Ambulatory Visit: Payer: Medicare Other | Admitting: *Deleted

## 2018-03-09 DIAGNOSIS — E875 Hyperkalemia: Secondary | ICD-10-CM

## 2018-03-09 LAB — BASIC METABOLIC PANEL
BUN / CREAT RATIO: 14 (ref 10–24)
BUN/Creatinine Ratio: 14 (ref 10–24)
BUN: 21 mg/dL (ref 8–27)
BUN: 21 mg/dL (ref 8–27)
CALCIUM: 9.7 mg/dL (ref 8.6–10.2)
CO2: 28 mmol/L (ref 20–29)
CO2: 29 mmol/L (ref 20–29)
Calcium: 9.5 mg/dL (ref 8.6–10.2)
Chloride: 102 mmol/L (ref 96–106)
Chloride: 101 mmol/L (ref 96–106)
Creatinine, Ser: 1.54 mg/dL — ABNORMAL HIGH (ref 0.76–1.27)
Creatinine, Ser: 1.46 mg/dL — ABNORMAL HIGH (ref 0.76–1.27)
GFR calc Af Amer: 52 mL/min/{1.73_m2} — ABNORMAL LOW (ref >59)
GFR, EST AFRICAN AMERICAN: 56 mL/min/{1.73_m2} — AB (ref >59)
GFR, EST NON AFRICAN AMERICAN: 45 mL/min/{1.73_m2} — AB (ref >59)
GFR, EST NON AFRICAN AMERICAN: 48 mL/min/{1.73_m2} — AB (ref >59)
Glucose: 114 mg/dL — ABNORMAL HIGH (ref 65–99)
Glucose: 109 mg/dL — ABNORMAL HIGH (ref 65–99)
Potassium: 5.7 mmol/L — ABNORMAL HIGH (ref 3.5–5.2)
Potassium: 4.5 mmol/L (ref 3.5–5.2)
SODIUM: 138 mmol/L (ref 134–144)
Sodium: 142 mmol/L (ref 134–144)

## 2018-03-09 LAB — PRO B NATRIURETIC PEPTIDE: NT-Pro BNP: 3410 pg/mL — ABNORMAL HIGH (ref 0–376)

## 2018-03-09 LAB — CBC
HEMATOCRIT: 48.1 % (ref 37.5–51.0)
Hemoglobin: 16.5 g/dL (ref 13.0–17.7)
MCH: 29.2 pg (ref 26.6–33.0)
MCHC: 34.3 g/dL (ref 31.5–35.7)
MCV: 85 fL (ref 79–97)
Platelets: 218 10*3/uL (ref 150–450)
RBC: 5.66 x10E6/uL (ref 4.14–5.80)
RDW: 14.7 % (ref 12.3–15.4)
WBC: 8.1 10*3/uL (ref 3.4–10.8)

## 2018-03-09 NOTE — Telephone Encounter (Signed)
Pt has been made aware of normal result and verbalized understanding.  jw 03/09/18

## 2018-03-09 NOTE — Telephone Encounter (Signed)
-----   Message from Imogene Burn, PA-C sent at 03/09/2018  7:58 AM EDT ----- Potassium came back elevated at 5.7.  Could be a lab error but we do need to get a repeat today.  We have already decreased the Cozaar which can also retain potassium.  Kidney function about the same.

## 2018-03-10 NOTE — Anesthesia Preprocedure Evaluation (Addendum)
Anesthesia Evaluation  Patient identified by MRN, date of birth, ID band Patient awake    Reviewed: Allergy & Precautions, NPO status , Patient's Chart, lab work & pertinent test results  History of Anesthesia Complications Negative for: history of anesthetic complications  Airway Mallampati: I  TM Distance: >3 FB Neck ROM: Full    Dental  (+) Dental Advisory Given, Chipped,    Pulmonary neg pulmonary ROS,    breath sounds clear to auscultation       Cardiovascular hypertension, Pt. on medications +CHF  + dysrhythmias Atrial Fibrillation  Rhythm:Irregular Rate:Normal   '19 Cath - Normal left main Proximal to mid LAD 30% eccentric narrowing at the bifurcation with a large diagonal.  The first diagonal contains 30% eccentric stenosis.  Circumflex origin to 2 obtuse marginal branches, both of which are widely patent. RCA is not well-visualized.  Ostial and mid vessel luminal irregularities.  No significant obstruction. Normal pulmonary pressures.  LVEDP 17 mmHg.  Pulmonary capillary wedge mean is 18 to 20 mmHg. Nonischemic cardiomyopathy, possibly related to atrial fibrillation and tachycardia induced mechanism.  '19 Nuc Stress (prior to above cath) - The left ventricular ejection fraction is severely decreased (<30%). Nuclear stress EF: 19%. There was no ST segment deviation noted during stress. Defect 1: There is a medium defect of moderate severity present in the basal inferior, basal inferolateral, mid inferior and apical inferior location. Findings consistent with ischemia. This is a high risk study.  '19 TTE - LV cavity was mildly dilated. EF 20% to 25%. Severe diffuse hypokinesis with regional variations.Trivial AI, MR, and TR. Mildly dilated LA. PA peak pressure: 42 mm Hg   Neuro/Psych negative neurological ROS  negative psych ROS   GI/Hepatic negative GI ROS, Neg liver ROS,   Endo/Other  negative  endocrine ROS  Renal/GU CRFRenal disease  negative genitourinary   Musculoskeletal  (+) Arthritis ,   Abdominal   Peds  Hematology negative hematology ROS (+)   Anesthesia Other Findings   Reproductive/Obstetrics                            Anesthesia Physical Anesthesia Plan  ASA: III  Anesthesia Plan: General   Post-op Pain Management:    Induction: Intravenous  PONV Risk Score and Plan: 2 and Treatment may vary due to age or medical condition and Propofol infusion  Airway Management Planned: Natural Airway and Mask  Additional Equipment: None  Intra-op Plan:   Post-operative Plan:   Informed Consent: I have reviewed the patients History and Physical, chart, labs and discussed the procedure including the risks, benefits and alternatives for the proposed anesthesia with the patient or authorized representative who has indicated his/her understanding and acceptance.   Dental advisory given  Plan Discussed with: CRNA and Anesthesiologist  Anesthesia Plan Comments:        Anesthesia Quick Evaluation

## 2018-03-11 ENCOUNTER — Encounter (HOSPITAL_COMMUNITY)
Admission: RE | Disposition: A | Discharge status: Home / Self Care | Payer: Self-pay | Source: Ambulatory Visit | Attending: Cardiovascular Disease

## 2018-03-11 ENCOUNTER — Other Ambulatory Visit: Payer: Self-pay

## 2018-03-11 ENCOUNTER — Ambulatory Visit (HOSPITAL_COMMUNITY)
Admission: RE | Admit: 2018-03-11 | Discharge: 2018-03-11 | Disposition: A | Discharge status: Home / Self Care | Payer: Medicare Other | Source: Ambulatory Visit | Attending: Cardiovascular Disease | Admitting: Cardiovascular Disease

## 2018-03-11 ENCOUNTER — Ambulatory Visit (HOSPITAL_COMMUNITY): Payer: Medicare Other | Admitting: Anesthesiology

## 2018-03-11 ENCOUNTER — Telehealth: Payer: Self-pay

## 2018-03-11 ENCOUNTER — Encounter (HOSPITAL_COMMUNITY): Payer: Self-pay

## 2018-03-11 DIAGNOSIS — I428 Other cardiomyopathies: Secondary | ICD-10-CM | POA: Diagnosis not present

## 2018-03-11 DIAGNOSIS — I480 Paroxysmal atrial fibrillation: Secondary | ICD-10-CM | POA: Diagnosis not present

## 2018-03-11 DIAGNOSIS — N183 Chronic kidney disease, stage 3 (moderate): Secondary | ICD-10-CM | POA: Diagnosis not present

## 2018-03-11 DIAGNOSIS — M1990 Unspecified osteoarthritis, unspecified site: Secondary | ICD-10-CM | POA: Diagnosis not present

## 2018-03-11 DIAGNOSIS — I130 Hypertensive heart and chronic kidney disease with heart failure and stage 1 through stage 4 chronic kidney disease, or unspecified chronic kidney disease: Secondary | ICD-10-CM | POA: Insufficient documentation

## 2018-03-11 DIAGNOSIS — Z7901 Long term (current) use of anticoagulants: Secondary | ICD-10-CM | POA: Diagnosis not present

## 2018-03-11 DIAGNOSIS — E7800 Pure hypercholesterolemia, unspecified: Secondary | ICD-10-CM | POA: Insufficient documentation

## 2018-03-11 DIAGNOSIS — I5020 Unspecified systolic (congestive) heart failure: Secondary | ICD-10-CM | POA: Diagnosis not present

## 2018-03-11 DIAGNOSIS — I5022 Chronic systolic (congestive) heart failure: Secondary | ICD-10-CM | POA: Diagnosis not present

## 2018-03-11 HISTORY — PX: CARDIOVERSION: SHX1299

## 2018-03-11 SURGERY — CARDIOVERSION
Anesthesia: General

## 2018-03-11 MED ORDER — LIDOCAINE 2% (20 MG/ML) 5 ML SYRINGE
INTRAMUSCULAR | Status: DC | PRN
  Administered 2018-03-11: 60 mg via INTRAVENOUS

## 2018-03-11 MED ORDER — SODIUM CHLORIDE 0.9 % IV SOLN
INTRAVENOUS | Status: DC | PRN
  Administered 2018-03-11: 08:00:00 via INTRAVENOUS

## 2018-03-11 MED ORDER — SODIUM CHLORIDE 0.9% FLUSH: 3.0000 mL | Freq: Two times a day (BID) | INTRAVENOUS | Status: DC

## 2018-03-11 MED ORDER — HYDROCORTISONE 1 % EX CREA: 1.0000 "application " | TOPICAL_CREAM | Freq: Three times a day (TID) | CUTANEOUS | Status: DC | PRN

## 2018-03-11 MED ORDER — SODIUM CHLORIDE 0.9 % IV SOLN
250.0000 mL | INTRAVENOUS | Status: DC
  Administered 2018-03-11: 250 mL via INTRAVENOUS

## 2018-03-11 MED ORDER — PROPOFOL 10 MG/ML IV BOLUS
INTRAVENOUS | Status: DC | PRN
  Administered 2018-03-11: 60 mg via INTRAVENOUS
  Administered 2018-03-11: 30 mg via INTRAVENOUS
  Administered 2018-03-11: 40 mg via INTRAVENOUS

## 2018-03-11 MED ORDER — SODIUM CHLORIDE 0.9% FLUSH: 3.0000 mL | INTRAVENOUS | Status: DC | PRN

## 2018-03-11 NOTE — Interval H&P Note (Signed)
History and Physical Interval Note:  03/11/2018 8:06 AM  John Cobb  has presented today for surgery, with the diagnosis of atrial fibrillation  The various methods of treatment have been discussed with the patient and family. After consideration of risks, benefits and other options for treatment, the patient has consented to  Procedure(s): CARDIOVERSION (N/A) as a surgical intervention .  The patient's history has been reviewed, patient examined, no change in status, stable for surgery.  I have reviewed the patient's chart and labs.  Questions were answered to the patient's satisfaction.     [name], MD

## 2018-03-11 NOTE — Anesthesia Postprocedure Evaluation (Signed)
Anesthesia Post Note  Patient: John Cobb  Procedure(s) Performed: CARDIOVERSION (N/A )     Patient location during evaluation: PACU Anesthesia Type: General Level of consciousness: awake and alert Pain management: pain level controlled Vital Signs Assessment: post-procedure vital signs reviewed and stable Respiratory status: spontaneous breathing, nonlabored ventilation and respiratory function stable Cardiovascular status: blood pressure returned to baseline and stable Postop Assessment: no apparent nausea or vomiting Anesthetic complications: no    Last Vitals:  Vitals:   03/11/18 0824 03/11/18 0833  BP: 99/74 103/69  Pulse: 88 (!) 116  Resp: 16 13  Temp: 36.5 C   SpO2: 96% 94%    Last Pain:  Vitals:   03/11/18 0833  TempSrc:   PainSc: 0-No pain                 Audry Pili

## 2018-03-11 NOTE — Telephone Encounter (Signed)
Spoke with patient in regards to his cardioversion failure.  I informed him that we would be contacting him to arrange an A-Fib clinic appt.  He verbalized understanding.

## 2018-03-11 NOTE — Telephone Encounter (Signed)
Informed patient by message that he has been referred to the A-Fib clinic and will here from someone to schedule.  I told him to call with questions.

## 2018-03-11 NOTE — Anesthesia Procedure Notes (Signed)
Procedure Name: General with mask airway Date/Time: 03/11/2018 8:12 AM Performed by: Orlie Dakin, CRNA Pre-anesthesia Checklist: Patient identified, Emergency Drugs available, Suction available and Patient being monitored Patient Re-evaluated:Patient Re-evaluated prior to induction Oxygen Delivery Method: Ambu bag Preoxygenation: Pre-oxygenation with 100% oxygen Induction Type: IV induction

## 2018-03-11 NOTE — Telephone Encounter (Signed)
Follow up:   Patient returning call and stated he had a AFIB this morning with Dr. Oval Linsey.

## 2018-03-11 NOTE — CV Procedure (Signed)
Electrical Cardioversion Procedure Note John Cobb [id number] 1946-06-06  Procedure: Electrical Cardioversion Indications:  Atrial Fibrillation  Procedure Details Consent: Risks of procedure as well as the alternatives and risks of each were explained to the (patient/caregiver).  Consent for procedure obtained. Time Out: Verified patient identification, verified procedure, site/side was marked, verified correct patient position, special equipment/implants available, medications/allergies/relevent history reviewed, required imaging and test results available.  Performed  Patient placed on cardiac monitor, pulse oximetry, supplemental oxygen as necessary.  Sedation given: propofol Pacer pads placed anterior and posterior chest.  Cardioverted 3 time(s).  Cardioverted at unsuccessful.  Attempted 150J, 200J, and 200J with pressure applied.  Evaluation Findings: Post procedure EKG shows: Atrial Fibrillation Complications: None Patient did tolerate procedure well.   John Latch, MD 03/11/2018, 8:18 AM

## 2018-03-11 NOTE — Transfer of Care (Signed)
Immediate Anesthesia Transfer of Care Note  Patient: John Cobb  Procedure(s) Performed: CARDIOVERSION (N/A )  Patient Location: Endoscopy Unit  Anesthesia Type:General  Level of Consciousness: drowsy and patient cooperative  Airway & Oxygen Therapy: Patient Spontanous Breathing  Post-op Assessment: Report given to RN and Post -op Vital signs reviewed and stable  Post vital signs: Reviewed and stable  Last Vitals:  Vitals Value Taken Time  BP    Temp    Pulse    Resp    SpO2      Last Pain:  Vitals:   03/11/18 0724  TempSrc: Oral  PainSc: 0-No pain         Complications: No apparent anesthesia complications

## 2018-03-13 ENCOUNTER — Encounter (HOSPITAL_COMMUNITY): Payer: Self-pay | Admitting: Cardiovascular Disease

## 2018-03-21 ENCOUNTER — Ambulatory Visit (HOSPITAL_COMMUNITY)
Admission: RE | Admit: 2018-03-21 | Discharge: 2018-03-21 | Disposition: A | Discharge status: Home / Self Care | Payer: Medicare Other | Source: Ambulatory Visit | Attending: Nurse Practitioner | Admitting: Nurse Practitioner

## 2018-03-21 ENCOUNTER — Telehealth: Payer: Self-pay | Admitting: Pharmacist

## 2018-03-21 ENCOUNTER — Encounter (HOSPITAL_COMMUNITY): Payer: Self-pay | Admitting: Nurse Practitioner

## 2018-03-21 VITALS — BP 126/82 | HR 91 | Ht 72.0 in | Wt 192.8 lb

## 2018-03-21 DIAGNOSIS — Z800 Family history of malignant neoplasm of digestive organs: Secondary | ICD-10-CM | POA: Insufficient documentation

## 2018-03-21 DIAGNOSIS — E785 Hyperlipidemia, unspecified: Secondary | ICD-10-CM | POA: Diagnosis not present

## 2018-03-21 DIAGNOSIS — Z91030 Bee allergy status: Secondary | ICD-10-CM | POA: Diagnosis not present

## 2018-03-21 DIAGNOSIS — Z8249 Family history of ischemic heart disease and other diseases of the circulatory system: Secondary | ICD-10-CM | POA: Insufficient documentation

## 2018-03-21 DIAGNOSIS — Z79899 Other long term (current) drug therapy: Secondary | ICD-10-CM | POA: Insufficient documentation

## 2018-03-21 DIAGNOSIS — N189 Chronic kidney disease, unspecified: Secondary | ICD-10-CM | POA: Diagnosis not present

## 2018-03-21 DIAGNOSIS — M1990 Unspecified osteoarthritis, unspecified site: Secondary | ICD-10-CM | POA: Diagnosis not present

## 2018-03-21 DIAGNOSIS — I4819 Other persistent atrial fibrillation: Secondary | ICD-10-CM

## 2018-03-21 DIAGNOSIS — I481 Persistent atrial fibrillation: Secondary | ICD-10-CM | POA: Diagnosis not present

## 2018-03-21 DIAGNOSIS — Z9841 Cataract extraction status, right eye: Secondary | ICD-10-CM | POA: Insufficient documentation

## 2018-03-21 DIAGNOSIS — I4891 Unspecified atrial fibrillation: Secondary | ICD-10-CM | POA: Diagnosis present

## 2018-03-21 DIAGNOSIS — Z9842 Cataract extraction status, left eye: Secondary | ICD-10-CM | POA: Diagnosis not present

## 2018-03-21 DIAGNOSIS — Z961 Presence of intraocular lens: Secondary | ICD-10-CM | POA: Diagnosis not present

## 2018-03-21 DIAGNOSIS — I129 Hypertensive chronic kidney disease with stage 1 through stage 4 chronic kidney disease, or unspecified chronic kidney disease: Secondary | ICD-10-CM | POA: Diagnosis not present

## 2018-03-21 DIAGNOSIS — I2510 Atherosclerotic heart disease of native coronary artery without angina pectoris: Secondary | ICD-10-CM | POA: Diagnosis not present

## 2018-03-21 DIAGNOSIS — Z801 Family history of malignant neoplasm of trachea, bronchus and lung: Secondary | ICD-10-CM | POA: Insufficient documentation

## 2018-03-21 DIAGNOSIS — Z7901 Long term (current) use of anticoagulants: Secondary | ICD-10-CM | POA: Diagnosis not present

## 2018-03-21 NOTE — Telephone Encounter (Signed)
Medication list reviewed in anticipation of upcoming Tikosyn initiation. Patient is not taking any contraindicated or QTc prolonging medications.   Patient is anticoagulated on Eliquis 5mg BID on the appropriate dose. Please ensure that patient has not missed any anticoagulation doses in the 3 weeks prior to Tikosyn initiation.   Patient will need to be counseled to avoid use of Benadryl while on Tikosyn and in the 2-3 days prior to Tikosyn initiation. 

## 2018-03-22 ENCOUNTER — Other Ambulatory Visit (HOSPITAL_COMMUNITY): Payer: Self-pay | Admitting: *Deleted

## 2018-03-22 DIAGNOSIS — T63451D Toxic effect of venom of hornets, accidental (unintentional), subsequent encounter: Secondary | ICD-10-CM | POA: Diagnosis not present

## 2018-03-22 DIAGNOSIS — T63441D Toxic effect of venom of bees, accidental (unintentional), subsequent encounter: Secondary | ICD-10-CM | POA: Diagnosis not present

## 2018-03-22 DIAGNOSIS — T63461D Toxic effect of venom of wasps, accidental (unintentional), subsequent encounter: Secondary | ICD-10-CM | POA: Diagnosis not present

## 2018-03-22 MED ORDER — METOPROLOL SUCCINATE ER 25 MG PO TB24: 75.0000 mg | ORAL_TABLET | Freq: Every day | ORAL | Status: DC

## 2018-03-22 NOTE — Progress Notes (Signed)
Primary Care Physician: Hulan Fess, MD Referring Physician: Cardioversion f/u/Dr. Oval Linsey Cardiologist: Dr. Rexene Edison is a 72 y.o. male with a h/o  hypertension, hyperlipidemia and newly diagnosed atrial fibrillation CVR on Toprol and Eliquis for CHA2DS2-VASc of 2. 2D echo performed and showed a severe LV dysfunction ejection fraction 20 to 25% with severe diffuse hypokinesis. Stress test EF 19% with findings of ischemia high risk study that led to cardiac catheterization. Cardiac cath showed nonobstructive CAD and he was felt to have nonischemic cardiomyopathy possibly related to A. fib and tachycardia induced mechanism. He was having shortness of breath that improved with laisx. He was scheduled for cardioversion but it was not able to restore SR. He is in the afib clinic to discuss antiarrythmic's.   Today, he denies symptoms of palpitations, chest pain, shortness of breath, orthopnea, PND, lower extremity edema, dizziness, presyncope, syncope, or neurologic sequela. The patient is tolerating medications without difficulties and is otherwise without complaint today.   Past Medical History:  Diagnosis Date  . Arthritis   . Benign essential HTN 01/05/2018  . Blood transfusion    As an infant  . Dislocated IOL (intraocular lens), posterior 10/02/2011  . High cholesterol   . Hypertension   . Paroxysmal atrial fibrillation ([facility] [place]) 01/05/2018  . Pneumonia ~ 1978  . SOB (shortness of breath) 01/05/2018   Past Surgical History:  Procedure Laterality Date  . ANTERIOR CERVICAL DISCECTOMY  2008  . APPENDECTOMY     "as an infant"  . CARDIOVERSION N/A 03/11/2018   Procedure: CARDIOVERSION;  Surgeon: [name], MD;  Location: [facility];  Service: Cardiovascular;  Laterality: N/A;  . CATARACT EXTRACTION W/ INTRAOCULAR LENS IMPLANT  08/13/11   left  . CATARACT EXTRACTION W/ INTRAOCULAR LENS IMPLANT  09/03/11   right  . Colonscopy    . Compound Fractures  06/1980   right arm; repaired with 2 plates and 10 pins.  . FRACTURE SURGERY    . GAS/FLUID EXCHANGE  10/20/2011   Procedure: GAS/FLUID EXCHANGE;  Surgeon: Hayden Pedro, MD;  Location: [facility];  Service: Ophthalmology;  Laterality: Right;  . HERNIA REPAIR     "umbilical; as an infant"  . INTRAOCULAR LENS EXCHANGE  10/20/11   right eye  . PARS PLANA VITRECTOMY  10/20/2011   Procedure: PARS PLANA VITRECTOMY WITH 25G REMOVAL/SUTURE INTRAOCULAR LENS;  Surgeon: Hayden Pedro, MD;  Location: [facility];  Service: Ophthalmology;  Laterality: Right;  . RIGHT/LEFT HEART CATH AND CORONARY ANGIOGRAPHY N/A 01/20/2018   Procedure: RIGHT/LEFT HEART CATH AND CORONARY ANGIOGRAPHY;  Surgeon: Belva Crome, MD;  Location: [place] [facility];  Service: Cardiovascular;  Laterality: N/A;    Current Outpatient Medications  Medication Sig Dispense Refill  . alclomethasone (ACLOVATE) 0.05 % cream Apply 1 application topically daily as needed (for rash).    Marland Kitchen apixaban (ELIQUIS) 5 MG TABS tablet Take 1 tablet (5 mg total) by mouth 2 (two) times daily. 60 tablet 11  . fish oil-omega-3 fatty acids 1000 MG capsule Take 1 g by mouth daily.     . furosemide (LASIX) 40 MG tablet Take 1 tablet (40 mg total) by mouth daily. 90 tablet 3  . losartan (COZAAR) 100 MG tablet Take 0.5 tablets (50 mg total) by mouth daily. 45 tablet 3  . metoprolol succinate (TOPROL XL) 25 MG 24 hr tablet Take 1 tablet (25 mg total) by mouth daily. (Patient taking differently: Take 75 mg by mouth daily. ) 90 tablet 1  .  Multiple Vitamin (MULITIVITAMIN WITH MINERALS) TABS Take 1 tablet by mouth daily.    . pravastatin (PRAVACHOL) 80 MG tablet Take 80 mg by mouth daily.    . tamsulosin (FLOMAX) 0.4 MG CAPS capsule Take 0.4 mg by mouth daily.    Marland Kitchen triamcinolone cream (KENALOG) 0.1 % Apply 1 application topically daily as needed (for rash).   0   No current facility-administered medications for this encounter.     Allergies  Allergen Reactions  . Honey Bee  Treatment [Bee Venom] Anaphylaxis, Hives and Swelling  . Wasp Venom Anaphylaxis, Hives and Swelling  . Neosporin [Neomycin-Bacitracin Zn-Polymyx] Rash    Social History   Socioeconomic History  . Marital status: Married    Spouse name: Not on file  . Number of children: Not on file  . Years of education: Not on file  . Highest education level: Not on file  Occupational History  . Not on file  Social Needs  . Financial resource strain: Not on file  . Food insecurity:    Worry: Not on file    Inability: Not on file  . Transportation needs:    Medical: Not on file    Non-medical: Not on file  Tobacco Use  . Smoking status: Never Smoker  . Smokeless tobacco: Former Systems developer    Types: Chew  Substance and Sexual Activity  . Alcohol use: No  . Drug use: No  . Sexual activity: Not Currently  Lifestyle  . Physical activity:    Days per week: Not on file    Minutes per session: Not on file  . Stress: Not on file  Relationships  . Social connections:    Talks on phone: Not on file    Gets together: Not on file    Attends religious service: Not on file    Active member of club or organization: Not on file    Attends meetings of clubs or organizations: Not on file    Relationship status: Not on file  . Intimate partner violence:    Fear of current or ex partner: Not on file    Emotionally abused: Not on file    Physically abused: Not on file    Forced sexual activity: Not on file  Other Topics Concern  . Not on file  Social History Narrative  . Not on file    Family History  Problem Relation Age of Onset  . Lung cancer Mother   . Stomach cancer Mother   . Heart attack Father   . Anesthesia problems Neg Hx     ROS- All systems are reviewed and negative except as per the HPI above  Physical Exam: Vitals:   03/21/18 1021  BP: 126/82  Pulse: 91  Weight: 87.5 kg  Height: 6' (1.829 m)   Wt Readings from Last 3 Encounters:  03/21/18 87.5 kg  03/11/18 86.6 kg    03/08/18 86.6 kg    Labs: Lab Results  Component Value Date   NA 138 03/09/2018   K 4.5 03/09/2018   CL 101 03/09/2018   CO2 29 03/09/2018   GLUCOSE 109 (H) 03/09/2018   BUN 21 03/09/2018   CREATININE 1.46 (H) 03/09/2018   CALCIUM 9.5 03/09/2018   No results found for: INR No results found for: CHOL, HDL, LDLCALC, TRIG   GEN- The patient is well appearing, alert and oriented x 3 today.   Head- normocephalic, atraumatic Eyes-  Sclera clear, conjunctiva pink Ears- hearing intact Oropharynx- clear Neck- supple, no  JVP Lymph- no cervical lymphadenopathy Lungs- Clear to ausculation bilaterally, normal work of breathing Heart- Regular rate and rhythm, no murmurs, rubs or gallops, PMI not laterally displaced GI- soft, NT, ND, + BS Extremities- no clubbing, cyanosis, or edema MS- no significant deformity or atrophy Skin- no rash or lesion Psych- euthymic mood, full affect Neuro- strength and sensation are intact  EKG-afib at 91 bpm, qrs int 72 ms, qtc 415 ms    Assessment and Plan: 1. Persisitent afib, failed cardioversion Antiarrythmic's discussed with pt Tikosyn discussed with pt as he is not a candidate for multaq/flecainde for low EF, on young side for amiodarone Tikosyn guidelines discussed Qtc stable at 415 ms He did check on price of drug and is willing to go ahead with plans He did miss one dose of eliquis last Wednesday, continue  Scheduled for 10/1 for admission to allow for 3 weeks of uninterrupted anticoagatulion Drugs to be screened by  PharmD, also takes bee venom shots every month, PharmD will be made aware of that as well, in case any concerns with Tikosyn He denies benadryl use  2. LV dysfunction, possibly TMC 2/2 afib Recent LHC with mild non obstructive CAD Fluid weight stable Continue furosemide Daily weights  3. CKD  I calculated crcl  today at 59.17, may only be able to take 250 mcg bid Will be recalculated at time of admission  Butch Penny C.  [place]ess[place], [facility] [address] [place], [place] [address] [phone]

## 2018-03-29 ENCOUNTER — Ambulatory Visit: Payer: Medicare Other | Admitting: Physician Assistant

## 2018-03-31 DIAGNOSIS — E782 Mixed hyperlipidemia: Secondary | ICD-10-CM | POA: Diagnosis not present

## 2018-03-31 DIAGNOSIS — I10 Essential (primary) hypertension: Secondary | ICD-10-CM | POA: Diagnosis not present

## 2018-04-08 DIAGNOSIS — I10 Essential (primary) hypertension: Secondary | ICD-10-CM | POA: Diagnosis not present

## 2018-04-08 DIAGNOSIS — N189 Chronic kidney disease, unspecified: Secondary | ICD-10-CM | POA: Diagnosis not present

## 2018-04-08 DIAGNOSIS — Z86010 Personal history of colonic polyps: Secondary | ICD-10-CM | POA: Diagnosis not present

## 2018-04-08 DIAGNOSIS — I428 Other cardiomyopathies: Secondary | ICD-10-CM | POA: Diagnosis not present

## 2018-04-08 DIAGNOSIS — I4891 Unspecified atrial fibrillation: Secondary | ICD-10-CM | POA: Diagnosis not present

## 2018-04-08 DIAGNOSIS — E782 Mixed hyperlipidemia: Secondary | ICD-10-CM | POA: Diagnosis not present

## 2018-04-08 DIAGNOSIS — N400 Enlarged prostate without lower urinary tract symptoms: Secondary | ICD-10-CM | POA: Diagnosis not present

## 2018-04-08 DIAGNOSIS — Z0000 Encounter for general adult medical examination without abnormal findings: Secondary | ICD-10-CM | POA: Diagnosis not present

## 2018-04-08 DIAGNOSIS — Z23 Encounter for immunization: Secondary | ICD-10-CM | POA: Diagnosis not present

## 2018-04-08 DIAGNOSIS — R7301 Impaired fasting glucose: Secondary | ICD-10-CM | POA: Diagnosis not present

## 2018-04-12 ENCOUNTER — Ambulatory Visit (HOSPITAL_COMMUNITY): Payer: Medicare Other | Admitting: Nurse Practitioner

## 2018-04-19 ENCOUNTER — Encounter (HOSPITAL_COMMUNITY): Payer: Self-pay | Admitting: Nurse Practitioner

## 2018-04-19 ENCOUNTER — Other Ambulatory Visit: Payer: Self-pay

## 2018-04-19 ENCOUNTER — Inpatient Hospital Stay (HOSPITAL_COMMUNITY)
Admission: RE | Admit: 2018-04-19 | Discharge: 2018-04-22 | DRG: 310 | Disposition: A | Discharge status: Home / Self Care | Payer: Medicare Other | Source: Ambulatory Visit | Attending: Internal Medicine | Admitting: Internal Medicine

## 2018-04-19 ENCOUNTER — Encounter (HOSPITAL_COMMUNITY): Payer: Self-pay | Admitting: General Practice

## 2018-04-19 ENCOUNTER — Ambulatory Visit (HOSPITAL_COMMUNITY)
Admission: RE | Admit: 2018-04-19 | Discharge: 2018-04-19 | Disposition: A | Discharge status: Home / Self Care | Payer: Medicare Other | Source: Ambulatory Visit | Attending: Nurse Practitioner | Admitting: Nurse Practitioner

## 2018-04-19 VITALS — BP 104/72 | HR 100 | Ht 72.0 in | Wt 197.0 lb

## 2018-04-19 DIAGNOSIS — I4819 Other persistent atrial fibrillation: Secondary | ICD-10-CM | POA: Diagnosis not present

## 2018-04-19 DIAGNOSIS — Z91030 Bee allergy status: Secondary | ICD-10-CM

## 2018-04-19 DIAGNOSIS — Z79899 Other long term (current) drug therapy: Secondary | ICD-10-CM | POA: Diagnosis not present

## 2018-04-19 DIAGNOSIS — I10 Essential (primary) hypertension: Secondary | ICD-10-CM | POA: Diagnosis not present

## 2018-04-19 DIAGNOSIS — I272 Pulmonary hypertension, unspecified: Secondary | ICD-10-CM | POA: Diagnosis present

## 2018-04-19 DIAGNOSIS — E785 Hyperlipidemia, unspecified: Secondary | ICD-10-CM | POA: Diagnosis not present

## 2018-04-19 DIAGNOSIS — N189 Chronic kidney disease, unspecified: Secondary | ICD-10-CM

## 2018-04-19 DIAGNOSIS — Z800 Family history of malignant neoplasm of digestive organs: Secondary | ICD-10-CM

## 2018-04-19 DIAGNOSIS — N183 Chronic kidney disease, stage 3 (moderate): Secondary | ICD-10-CM | POA: Diagnosis present

## 2018-04-19 DIAGNOSIS — Z7901 Long term (current) use of anticoagulants: Secondary | ICD-10-CM | POA: Diagnosis not present

## 2018-04-19 DIAGNOSIS — Z5181 Encounter for therapeutic drug level monitoring: Secondary | ICD-10-CM

## 2018-04-19 DIAGNOSIS — I2510 Atherosclerotic heart disease of native coronary artery without angina pectoris: Secondary | ICD-10-CM | POA: Diagnosis present

## 2018-04-19 DIAGNOSIS — I428 Other cardiomyopathies: Secondary | ICD-10-CM

## 2018-04-19 DIAGNOSIS — I519 Heart disease, unspecified: Secondary | ICD-10-CM | POA: Diagnosis not present

## 2018-04-19 DIAGNOSIS — I5020 Unspecified systolic (congestive) heart failure: Secondary | ICD-10-CM | POA: Diagnosis not present

## 2018-04-19 DIAGNOSIS — Z801 Family history of malignant neoplasm of trachea, bronchus and lung: Secondary | ICD-10-CM

## 2018-04-19 DIAGNOSIS — Z8249 Family history of ischemic heart disease and other diseases of the circulatory system: Secondary | ICD-10-CM

## 2018-04-19 DIAGNOSIS — E7800 Pure hypercholesterolemia, unspecified: Secondary | ICD-10-CM

## 2018-04-19 DIAGNOSIS — I130 Hypertensive heart and chronic kidney disease with heart failure and stage 1 through stage 4 chronic kidney disease, or unspecified chronic kidney disease: Secondary | ICD-10-CM | POA: Diagnosis not present

## 2018-04-19 DIAGNOSIS — I1310 Hypertensive heart and chronic kidney disease without heart failure, with stage 1 through stage 4 chronic kidney disease, or unspecified chronic kidney disease: Secondary | ICD-10-CM | POA: Diagnosis present

## 2018-04-19 DIAGNOSIS — M1990 Unspecified osteoarthritis, unspecified site: Secondary | ICD-10-CM

## 2018-04-19 DIAGNOSIS — I493 Ventricular premature depolarization: Secondary | ICD-10-CM | POA: Diagnosis not present

## 2018-04-19 DIAGNOSIS — Z888 Allergy status to other drugs, medicaments and biological substances status: Secondary | ICD-10-CM | POA: Insufficient documentation

## 2018-04-19 DIAGNOSIS — I480 Paroxysmal atrial fibrillation: Secondary | ICD-10-CM | POA: Diagnosis not present

## 2018-04-19 HISTORY — DX: Chronic kidney disease, stage 3 unspecified: N18.30

## 2018-04-19 HISTORY — DX: Chronic kidney disease, stage 3 (moderate): N18.3

## 2018-04-19 HISTORY — DX: Other cardiomyopathies: I42.8

## 2018-04-19 LAB — BASIC METABOLIC PANEL
Anion gap: 11 (ref 5–15)
BUN: 21 mg/dL (ref 8–23)
CO2: 30 mmol/L (ref 22–32)
CREATININE: 1.43 mg/dL — AB (ref 0.61–1.24)
Calcium: 9.5 mg/dL (ref 8.9–10.3)
Chloride: 99 mmol/L (ref 98–111)
GFR calc non Af Amer: 48 mL/min — ABNORMAL LOW (ref >60)
GFR, EST AFRICAN AMERICAN: 55 mL/min — AB (ref >60)
Glucose, Bld: 92 mg/dL (ref 70–99)
Potassium: 4.5 mmol/L (ref 3.5–5.1)
SODIUM: 140 mmol/L (ref 135–145)

## 2018-04-19 LAB — MAGNESIUM: MAGNESIUM: 2.3 mg/dL (ref 1.7–2.4)

## 2018-04-19 MED ORDER — TAMSULOSIN HCL 0.4 MG PO CAPS
0.4000 mg | ORAL_CAPSULE | Freq: Every day | ORAL | Status: DC
  Administered 2018-04-21 – 2018-04-22 (×2): 0.4 mg via ORAL
  Filled 2018-04-19 (×2): qty 1

## 2018-04-19 MED ORDER — DOFETILIDE 250 MCG PO CAPS
250.0000 ug | ORAL_CAPSULE | Freq: Two times a day (BID) | ORAL | Status: DC
  Administered 2018-04-19 – 2018-04-22 (×6): 250 ug via ORAL
  Filled 2018-04-19 (×6): qty 1

## 2018-04-19 MED ORDER — SODIUM CHLORIDE 0.9% FLUSH
3.0000 mL | Freq: Two times a day (BID) | INTRAVENOUS | Status: DC
  Administered 2018-04-19 – 2018-04-20 (×3): 3 mL via INTRAVENOUS

## 2018-04-19 MED ORDER — FUROSEMIDE 40 MG PO TABS
40.0000 mg | ORAL_TABLET | Freq: Every day | ORAL | Status: DC
  Administered 2018-04-20 – 2018-04-22 (×3): 40 mg via ORAL
  Filled 2018-04-19 (×3): qty 1

## 2018-04-19 MED ORDER — PRAVASTATIN SODIUM 40 MG PO TABS
80.0000 mg | ORAL_TABLET | Freq: Every day | ORAL | Status: DC
  Administered 2018-04-19 – 2018-04-21 (×3): 80 mg via ORAL
  Filled 2018-04-19 (×3): qty 2

## 2018-04-19 MED ORDER — ADULT MULTIVITAMIN W/MINERALS CH
1.0000 | ORAL_TABLET | Freq: Every evening | ORAL | Status: DC
  Administered 2018-04-19 – 2018-04-21 (×3): 1 via ORAL
  Filled 2018-04-19 (×3): qty 1

## 2018-04-19 MED ORDER — APIXABAN 5 MG PO TABS
5.0000 mg | ORAL_TABLET | Freq: Two times a day (BID) | ORAL | Status: DC
  Administered 2018-04-19 – 2018-04-22 (×6): 5 mg via ORAL
  Filled 2018-04-19 (×6): qty 1

## 2018-04-19 MED ORDER — METOPROLOL SUCCINATE ER 50 MG PO TB24
75.0000 mg | ORAL_TABLET | Freq: Every day | ORAL | Status: DC
  Administered 2018-04-20 – 2018-04-21 (×2): 75 mg via ORAL
  Filled 2018-04-19 (×2): qty 1

## 2018-04-19 MED ORDER — OMEGA-3 FATTY ACIDS 1000 MG PO CAPS: 1.0000 g | ORAL_CAPSULE | Freq: Every evening | ORAL | Status: DC

## 2018-04-19 MED ORDER — LOSARTAN POTASSIUM 50 MG PO TABS
50.0000 mg | ORAL_TABLET | Freq: Every day | ORAL | Status: DC
  Administered 2018-04-20 – 2018-04-22 (×3): 50 mg via ORAL
  Filled 2018-04-19 (×3): qty 1

## 2018-04-19 MED ORDER — SODIUM CHLORIDE 0.9% FLUSH: 3.0000 mL | INTRAVENOUS | Status: DC | PRN

## 2018-04-19 MED ORDER — OMEGA-3-ACID ETHYL ESTERS 1 G PO CAPS
1.0000 g | ORAL_CAPSULE | Freq: Every day | ORAL | Status: DC
  Administered 2018-04-20 – 2018-04-22 (×3): 1 g via ORAL
  Filled 2018-04-19 (×3): qty 1

## 2018-04-19 MED ORDER — DOFETILIDE 250 MCG PO CAPS: 250.0000 ug | ORAL_CAPSULE | Freq: Two times a day (BID) | ORAL | Status: DC

## 2018-04-19 MED ORDER — SODIUM CHLORIDE 0.9 % IV SOLN
250.0000 mL | INTRAVENOUS | Status: DC | PRN
  Administered 2018-04-21: 12:00:00 via INTRAVENOUS

## 2018-04-19 NOTE — Progress Notes (Signed)
Primary Care Physician: Hulan Fess, MD Referring Physician: Cardioversion f/u/Dr. Oval Linsey Cardiologist: Dr. Rexene Edison is a 72 y.o. male with a h/o  hypertension, hyperlipidemia and newly diagnosed atrial fibrillation CVR on Toprol and Eliquis for CHA2DS2-VASc of 2. 2D echo performed and showed a severe LV dysfunction ejection fraction 20 to 25% with severe diffuse hypokinesis. Stress test EF 19% with findings of ischemia high risk study that led to cardiac catheterization. Cardiac cath showed nonobstructive CAD and he was felt to have nonischemic cardiomyopathy possibly related to A. fib and tachycardia induced mechanism. He was having shortness of breath that improved with laisx. He was scheduled for cardioversion but it was not able to restore SR. He was seen in the afib clinic to discuss antiarrythmic's. He decided on tikosyn admit but had missed does of eliquis and had to wait the apporpriated period of time.  He is now in the afib clinic for admission for Tikosyn. No benadryl, no missed doses of anticoagulation for at least the last 3 weeks. He is aware of price of drug and can afford .  Today, he denies symptoms of palpitations, chest pain, shortness of breath, orthopnea, PND, lower extremity edema, dizziness, presyncope, syncope, or neurologic sequela. The patient is tolerating medications without difficulties and is otherwise without complaint today.   Past Medical History:  Diagnosis Date  . Arthritis   . Benign essential HTN 01/05/2018  . Blood transfusion    As an infant  . Dislocated IOL (intraocular lens), posterior 10/02/2011  . High cholesterol   . Hypertension   . Paroxysmal atrial fibrillation ([place]) 01/05/2018  . Pneumonia ~ 1978  . SOB (shortness of breath) 01/05/2018   Past Surgical History:  Procedure Laterality Date  . ANTERIOR CERVICAL DISCECTOMY  2008  . APPENDECTOMY     "as an infant"  . CARDIOVERSION N/A 03/11/2018   Procedure:  CARDIOVERSION;  Surgeon: [name], MD;  Location: [facility];  Service: Cardiovascular;  Laterality: N/A;  . CATARACT EXTRACTION W/ INTRAOCULAR LENS IMPLANT  08/13/11   left  . CATARACT EXTRACTION W/ INTRAOCULAR LENS IMPLANT  09/03/11   right  . Colonscopy    . Compound Fractures  06/1980   right arm; repaired with 2 plates and 10 pins.  . FRACTURE SURGERY    . GAS/FLUID EXCHANGE  10/20/2011   Procedure: GAS/FLUID EXCHANGE;  Surgeon: Hayden Pedro, MD;  Location: [facility];  Service: Ophthalmology;  Laterality: Right;  . HERNIA REPAIR     "umbilical; as an infant"  . INTRAOCULAR LENS EXCHANGE  10/20/11   right eye  . PARS PLANA VITRECTOMY  10/20/2011   Procedure: PARS PLANA VITRECTOMY WITH 25G REMOVAL/SUTURE INTRAOCULAR LENS;  Surgeon: Hayden Pedro, MD;  Location: [facility];  Service: Ophthalmology;  Laterality: Right;  . RIGHT/LEFT HEART CATH AND CORONARY ANGIOGRAPHY N/A 01/20/2018   Procedure: RIGHT/LEFT HEART CATH AND CORONARY ANGIOGRAPHY;  Surgeon: Belva Crome, MD;  Location: [facility];  Service: Cardiovascular;  Laterality: N/A;    Current Outpatient Medications  Medication Sig Dispense Refill  . alclomethasone (ACLOVATE) 0.05 % cream Apply 1 application topically daily as needed (for rash). Forehead    . apixaban (ELIQUIS) 5 MG TABS tablet Take 1 tablet (5 mg total) by mouth 2 (two) times daily. 60 tablet 11  . fish oil-omega-3 fatty acids 1000 MG capsule Take 1 g by mouth every evening.     . furosemide (LASIX) 40 MG tablet Take 1 tablet (40  mg total) by mouth daily. 90 tablet 3  . losartan (COZAAR) 100 MG tablet Take 0.5 tablets (50 mg total) by mouth daily. 45 tablet 3  . metoprolol succinate (TOPROL-XL) 25 MG 24 hr tablet Take 25 mg by mouth daily. Taking with 1 50 mg tablet    . metoprolol succinate (TOPROL-XL) 50 MG 24 hr tablet Take 50 mg by mouth daily. Take with or immediately following a meal.    . Multiple Vitamin (MULITIVITAMIN WITH MINERALS) TABS Take 1  tablet by mouth every evening.     . pravastatin (PRAVACHOL) 80 MG tablet Take 80 mg by mouth at bedtime.     . tamsulosin (FLOMAX) 0.4 MG CAPS capsule Take 0.4 mg by mouth daily after breakfast. 30 minutes after breakfast     No current facility-administered medications for this encounter.     Allergies  Allergen Reactions  . Honey Bee Treatment [Bee Venom] Anaphylaxis, Hives and Swelling  . Wasp Venom Anaphylaxis, Hives and Swelling  . Neosporin [Neomycin-Bacitracin Zn-Polymyx] Rash    Social History   Socioeconomic History  . Marital status: Married    Spouse name: Not on file  . Number of children: Not on file  . Years of education: Not on file  . Highest education level: Not on file  Occupational History  . Not on file  Social Needs  . Financial resource strain: Not on file  . Food insecurity:    Worry: Not on file    Inability: Not on file  . Transportation needs:    Medical: Not on file    Non-medical: Not on file  Tobacco Use  . Smoking status: Never Smoker  . Smokeless tobacco: Former Systems developer    Types: Chew  Substance and Sexual Activity  . Alcohol use: No  . Drug use: No  . Sexual activity: Not Currently  Lifestyle  . Physical activity:    Days per week: Not on file    Minutes per session: Not on file  . Stress: Not on file  Relationships  . Social connections:    Talks on phone: Not on file    Gets together: Not on file    Attends religious service: Not on file    Active member of club or organization: Not on file    Attends meetings of clubs or organizations: Not on file    Relationship status: Not on file  . Intimate partner violence:    Fear of current or ex partner: Not on file    Emotionally abused: Not on file    Physically abused: Not on file    Forced sexual activity: Not on file  Other Topics Concern  . Not on file  Social History Narrative  . Not on file    Family History  Problem Relation Age of Onset  . Lung cancer Mother   .  Stomach cancer Mother   . Heart attack Father   . Anesthesia problems Neg Hx     ROS- All systems are reviewed and negative except as per the HPI above  Physical Exam: Vitals:   04/19/18 1130  BP: 104/72  Pulse: 100  Weight: 89.4 kg  Height: 6' (1.829 m)   Wt Readings from Last 3 Encounters:  04/19/18 89.4 kg  03/21/18 87.5 kg  03/11/18 86.6 kg    Labs: Lab Results  Component Value Date   NA 138 03/09/2018   K 4.5 03/09/2018   CL 101 03/09/2018   CO2 29 03/09/2018  GLUCOSE 109 (H) 03/09/2018   BUN 21 03/09/2018   CREATININE 1.46 (H) 03/09/2018   CALCIUM 9.5 03/09/2018   No results found for: INR No results found for: CHOL, HDL, LDLCALC, TRIG   GEN- The patient is well appearing, alert and oriented x 3 today.   Head- normocephalic, atraumatic Eyes-  Sclera clear, conjunctiva pink Ears- hearing intact Oropharynx- clear Neck- supple, no JVP Lymph- no cervical lymphadenopathy Lungs- Clear to ausculation bilaterally, normal work of breathing Heart- irregular rate and rhythm, no murmurs, rubs or gallops, PMI not laterally displaced GI- soft, NT, ND, + BS Extremities- no clubbing, cyanosis, or edema MS- no significant deformity or atrophy Skin- no rash or lesion Psych- euthymic mood, full affect Neuro- strength and sensation are intact  EKG-afib at 100  bpm, qrs int 72 ms, qtc 415 ms    Assessment and Plan: 1. Persisitent afib, failed cardioversion Antiarrythmic's discussed with pt Tikosyn discussed with pt as he is not a candidate for multaq/flecainde for low EF, on young side for amiodarone Tikosyn guidelines discussed Qtc stable at 438 ms He did check on price of drug and can afford No missed anticoagulation for at least the last 3 weeks Drugs  screened by  PharmD and no qtc prolonging drugs He denies benadryl use  2. LV dysfunction, possibly TMC 2/2 afib Recent LHC with mild non obstructive CAD He is fatigued/short of breath  with afib Continue  furosemide Daily weights  3. CKD  Previously  calculated crcl at 59.17, based on creatinine of 1.54, may only be able to take 250 mcg bid Will be recalculated at time of admission To [facility]. [place], [facility] [address] [place], [place] [address] [phone]

## 2018-04-19 NOTE — Care Management (Signed)
#   9.  S/W CHRIS  @ [facility] # [phone]   1. TIKOSYN   125 MCG   250 MCG   500 MCG BID [place] # [id number]  2. DOFTEILIDE   125 MCG BID COVER- YES CO-PAY-  $ 90.43 TIER- 4 DRUG PRIOR APPROVAL- NO  3. DOFETILIDE 250 MCG BID COVER- YES CO-PAY- $ 81.40 TIER- 4 DRUG PRIOR APPROVAL- NO  4. DOFETILIDE 500 MCG BID COVER- YES CO-PAY- $ 79.54 TIER- 4 DRUG PRIOR APPROVAL- NO  NO DEDUCTIBLE  PREFERRED PHARMACY : [facility] DAY SUPPLY FOR M/O $ 81.21      ===

## 2018-04-19 NOTE — Progress Notes (Signed)
Pharmacy Review for Dofetilide (Tikosyn) Initiation  Admit Complaint: 72 y.o. male admitted 04/19/2018 with atrial fibrillation to be initiated on dofetilide.   Assessment:  Patient Exclusion Criteria: If any screening criteria checked as "Yes", then  patient  should NOT receive dofetilide until criteria item is corrected. If "Yes" please indicate correction plan.  YES  NO Patient  Exclusion Criteria Correction Plan  []  [x]  Baseline QTc interval is greater than or equal to 440 msec. IF above YES box checked dofetilide contraindicated unless patient has ICD; then may proceed if QTc 500-550 msec or with known ventricular conduction abnormalities may proceed with QTc 550-600 msec. QTc =  438   []  [x]  Magnesium level is less than 1.8 mEq/l : Last magnesium:  Lab Results  Component Value Date   MG 2.3 04/19/2018         []  [x]  Potassium level is less than 4 mEq/l : Last potassium:  Lab Results  Component Value Date   K 4.5 04/19/2018         []  [x]  Patient is known or suspected to have a digoxin level greater than 2 ng/ml: No results found for: DIGOXIN    []  [x]  Creatinine clearance less than 20 ml/min (calculated using Cockcroft-Gault, actual body weight and serum creatinine): Estimated Creatinine Clearance: 52 mL/min (A) (by C-G formula based on SCr of 1.43 mg/dL (H)).    []  [x]  Patient has received drugs known to prolong the QT intervals within the last 48 hours (phenothiazines, tricyclics or tetracyclic antidepressants, erythromycin, H-1 antihistamines, cisapride, fluoroquinolones, azithromycin). Drugs not listed above may have an, as yet, undetected potential to prolong the QT interval, updated information on QT prolonging agents is available at this website:QT prolonging agents   []  [x]  Patient received a dose of hydrochlorothiazide (Oretic) alone or in any combination including triamterene (Dyazide, Maxzide) in the last 48 hours.   []  [x]  Patient received a medication known to  increase dofetilide plasma concentrations prior to initial dofetilide dose:  . Trimethoprim (Primsol, Proloprim) in the last 36 hours . Verapamil (Calan, Verelan) in the last 36 hours or a sustained release dose in the last 72 hours . Megestrol (Megace) in the last 5 days  . Cimetidine (Tagamet) in the last 6 hours . Ketoconazole (Nizoral) in the last 24 hours . Itraconazole (Sporanox) in the last 48 hours  . Prochlorperazine (Compazine) in the last 36 hours    []  [x]  Patient is known to have a history of torsades de pointes; congenital or acquired long QT syndromes.   []  [x]  Patient has received a Class 1 antiarrhythmic with less than 2 half-lives since last dose. (Disopyramide, Quinidine, Procainamide, Lidocaine, Mexiletine, Flecainide, Propafenone)   []  [x]  Patient has received amiodarone therapy in the past 3 months or amiodarone level is greater than 0.3 ng/ml.    Patient has been appropriately anticoagulated with apixaban.  Ordering provider was confirmed at [email or web address] if they are not listed on the [facility] Prescribers list.  Goal of Therapy: Follow renal function, electrolytes, potential drug interactions, and dose adjustment. Provide education and 1 week supply at discharge.  Plan:  [x]   Physician selected initial dose within range recommended for patients level of renal function - will monitor for response.  []   Physician selected initial dose outside of range recommended for patients level of renal function - will discuss if the dose should be altered at this time.   [facility] One Calculated CrCl  Dose q12h  []  > 60 ml/min 500  mcg  [x]  40-60 ml/min 250 mcg  []  20-40 ml/min 125 mcg   2. Follow up QTc after the first 5 doses, renal function, electrolytes (K & Mg) daily x 3 days, dose adjustment, success of initiation and facilitate 1 week discharge supply as clinically indicated.  3. Initiate Tikosyn education video (Call [phone] and ask for video # [phone]).  4.  Place Enrollment Form on the chart for discharge supply of dofetilide.  Elicia Lamp, PharmD, BCPS Clinical Pharmacist Clinical phone [phone] Please check AMION for all [facility] contact numbers 04/19/2018 2:05 PM

## 2018-04-19 NOTE — H&P (Addendum)
Cardiology Admission History and Physical:   Patient ID: John Cobb MRN: [id number]; DOB: 1945-11-06   Admission date: 04/19/2018  Primary Care Provider: Hulan Fess, MD Primary Cardiologist: Fransico Him, MD  Primary Electrophysiologist:  None   Chief Complaint:  Tikosyn initiation  Patient Profile:   John Cobb is a 72 y.o. male with hypertension, hyperlipidemia and newly diagnosed atrial fibrillation CVR on Toprol and Eliquis for CHA2DS2-VASc of 2. 2D echo performed and showed a severe LV dysfunction ejection fraction 20 to 25% with severe diffuse hypokinesis. Stress test EF 19% with findings of ischemia high risk study that led to cardiac catheterization. Cardiac cath showed nonobstructive CAD and he was felt to have nonischemic cardiomyopathy possibly related to A. fib and tachycardia induced mechanism.He was having shortness of breath that improved with laisx. He was scheduled for cardioversion but it was not able to restore SR. He was seen in the afib clinic to discuss antiarrythmic's. He decided on tikosyn admit but had missed does of eliquis and had to wait the apporpriated period of time.  He was seen today in the afib clinic for admission for Tikosyn. No benadryl, no missed doses of anticoagulation for at least the last 3 weeks. He is aware of price of drug and can afford .   History of Present Illness:   John Cobb comes today for Tikosyn intiation.  He reports discussion with D/ carroll, NP potential risks/benfits of tikosyn, we discussed protocol, what to expect while here, possible DCCV Thursday if not in SR.  He is agreeable to proceed.  He confirms no missed doses of his Eliquis on > 3 weeks   Past Medical History:  Diagnosis Date  . Arthritis   . Benign essential HTN 01/05/2018  . Blood transfusion    As an infant  . Dislocated IOL (intraocular lens), posterior 10/02/2011  . High cholesterol   . Hypertension   . Paroxysmal atrial fibrillation ([place])  01/05/2018  . Pneumonia ~ 1978  . SOB (shortness of breath) 01/05/2018    Past Surgical History:  Procedure Laterality Date  . ANTERIOR CERVICAL DISCECTOMY  2008  . APPENDECTOMY     "as an infant"  . CARDIOVERSION N/A 03/11/2018   Procedure: CARDIOVERSION;  Surgeon: [name], MD;  Location: [facility];  Service: Cardiovascular;  Laterality: N/A;  . CATARACT EXTRACTION W/ INTRAOCULAR LENS IMPLANT  08/13/11   left  . CATARACT EXTRACTION W/ INTRAOCULAR LENS IMPLANT  09/03/11   right  . Colonscopy    . Compound Fractures  06/1980   right arm; repaired with 2 plates and 10 pins.  . FRACTURE SURGERY    . GAS/FLUID EXCHANGE  10/20/2011   Procedure: GAS/FLUID EXCHANGE;  Surgeon: Hayden Pedro, MD;  Location: [facility];  Service: Ophthalmology;  Laterality: Right;  . HERNIA REPAIR     "umbilical; as an infant"  . INTRAOCULAR LENS EXCHANGE  10/20/11   right eye  . PARS PLANA VITRECTOMY  10/20/2011   Procedure: PARS PLANA VITRECTOMY WITH 25G REMOVAL/SUTURE INTRAOCULAR LENS;  Surgeon: Hayden Pedro, MD;  Location: [facility];  Service: Ophthalmology;  Laterality: Right;  . RIGHT/LEFT HEART CATH AND CORONARY ANGIOGRAPHY N/A 01/20/2018   Procedure: RIGHT/LEFT HEART CATH AND CORONARY ANGIOGRAPHY;  Surgeon: Belva Crome, MD;  Location: [facility];  Service: Cardiovascular;  Laterality: N/A;     Medications Prior to Admission: Prior to Admission medications   Medication Sig Start Date End Date Taking? Authorizing Provider  alclomethasone (ACLOVATE) 0.05 %  cream Apply 1 application topically daily as needed (for rash). Forehead    [provider]  apixaban (ELIQUIS) 5 MG TABS tablet Take 1 tablet (5 mg total) by mouth 2 (two) times daily. 02/02/18   Imogene Burn, PA-C  fish oil-omega-3 fatty acids 1000 MG capsule Take 1 g by mouth every evening.     [provider]  furosemide (LASIX) 40 MG tablet Take 1 tablet (40 mg total) by mouth daily. 02/04/18 05/05/18  Imogene Burn, PA-C  losartan (COZAAR) 100 MG tablet Take 0.5 tablets (50 mg total) by mouth daily. 03/08/18 06/06/18  Imogene Burn, PA-C  metoprolol succinate (TOPROL-XL) 25 MG 24 hr tablet Take 25 mg by mouth daily. Taking with 1 50 mg tablet    [provider]  metoprolol succinate (TOPROL-XL) 50 MG 24 hr tablet Take 50 mg by mouth daily. Take with or immediately following a meal.    [provider]  Multiple Vitamin (MULITIVITAMIN WITH MINERALS) TABS Take 1 tablet by mouth every evening.     [provider]  pravastatin (PRAVACHOL) 80 MG tablet Take 80 mg by mouth at bedtime.     [provider]  tamsulosin (FLOMAX) 0.4 MG CAPS capsule Take 0.4 mg by mouth daily after breakfast. 30 minutes after breakfast    [provider]     Allergies:    Allergies  Allergen Reactions  . Honey Bee Treatment [Bee Venom] Anaphylaxis, Hives and Swelling  . Wasp Venom Anaphylaxis, Hives and Swelling  . Neosporin [Neomycin-Bacitracin Zn-Polymyx] Rash    Social History:   Social History   Socioeconomic History  . Marital status: Married    Spouse name: Not on file  . Number of children: Not on file  . Years of education: Not on file  . Highest education level: Not on file  Occupational History  . Not on file  Social Needs  . Financial resource strain: Not on file  . Food insecurity:    Worry: Not on file    Inability: Not on file  . Transportation needs:    Medical: Not on file    Non-medical: Not on file  Tobacco Use  . Smoking status: Never Smoker  . Smokeless tobacco: Former Systems developer    Types: Chew  Substance and Sexual Activity  . Alcohol use: No  . Drug use: No  . Sexual activity: Not Currently  Lifestyle  . Physical activity:    Days per week: Not on file    Minutes per session: Not on file  . Stress: Not on file  Relationships  . Social connections:    Talks on phone: Not on file    Gets together: Not on file    Attends religious  service: Not on file    Active member of club or organization: Not on file    Attends meetings of clubs or organizations: Not on file    Relationship status: Not on file  . Intimate partner violence:    Fear of current or ex partner: Not on file    Emotionally abused: Not on file    Physically abused: Not on file    Forced sexual activity: Not on file  Other Topics Concern  . Not on file  Social History Narrative  . Not on file    Family History:  The patient's family history includes Heart attack in his father; Lung cancer in his mother; Stomach cancer in his mother. There is no history of  Anesthesia problems.    ROS:  Please see the history of present illness.  All other ROS reviewed and negative.     Physical Exam/Data:   Vitals:   04/19/18 1241  BP: 90/78  Pulse: 76  Temp: 97.8 F (36.6 C)  TempSrc: Oral  SpO2: 100%   No intake or output data in the 24 hours ending 04/19/18 1255 There were no vitals filed for this visit. There is no height or weight on file to calculate BMI.  General:  Well nourished, well developed, in no acute distress HEENT: normal Lymph: no adenopathy Neck: no JVD Endocrine:  No thryomegaly Vascular: No carotid bruits Cardiac:  irreg-irreg; no murmurs, gallops or rubs Lungs:  CTA b/l, no wheezing, rhonchi or rales  Abd: soft, nontender  Ext: no edema Musculoskeletal:  No deformities Skin: warm and dry  Neuro:   No gross focal abnormalities noted Psych:  Normal affect    EKG:  The ECG that was done today was personally reviewed and demonstrates AFib  Relevant CV Studies:  01/20/18: LHC  Normal left main  Proximal to mid LAD 30% eccentric narrowing at the bifurcation with a large diagonal.  The first diagonal contains 30% eccentric stenosis.  The  Circumflex origin to 2 obtuse marginal branches, both of which are widely patent.  RCA is not well-visualized.  Ostial and mid vessel luminal irregularities.  No significant  obstruction.  Normal pulmonary pressures.  LVEDP 17 mmHg.  Pulmonary capillary wedge mean is 18 to 20 mmHg.  Nonischemic cardiomyopathy, possibly related to atrial fibrillation and tachycardia induced mechanism.  01/17/18: TTE Study Conclusions - Left ventricle: The cavity size was mildly dilated. Systolic   function was severely reduced. The estimated ejection fraction   was in the range of 20% to 25%. Severe diffuse hypokinesis with   regional variations. The study was not technically sufficient to   allow evaluation of LV diastolic dysfunction due to atrial   fibrillation. Doppler parameters are consistent with high   ventricular filling pressure. - Aortic valve: Trileaflet; mildly thickened, mildly calcified   leaflets. There was trivial regurgitation. - Mitral valve: Calcified annulus. There was trivial regurgitation. - Left atrium: The atrium was mildly dilated. - Tricuspid valve: There was trivial regurgitation. - Pulmonary arteries: PA peak pressure: 42 mm Hg (S). Impressions: - The right ventricular systolic pressure was increased consistent   with mild pulmonary hypertension.  Laboratory Data:  Chemistry Recent Labs  Lab 04/19/18 1106  NA 140  K 4.5  CL 99  CO2 30  GLUCOSE 92  BUN 21  CREATININE 1.43*  CALCIUM 9.5  GFRNONAA 48*  GFRAA 55*  ANIONGAP 11    No results for input(s): PROT, ALBUMIN, AST, ALT, ALKPHOS, BILITOT in the last 168 hours. HematologyNo results for input(s): WBC, RBC, HGB, HCT, MCV, MCH, MCHC, RDW, PLT in the last 168 hours. Cardiac EnzymesNo results for input(s): TROPONINI in the last 168 hours. No results for input(s): TROPIPOC in the last 168 hours.  BNPNo results for input(s): BNP, PROBNP in the last 168 hours.  DDimer No results for input(s): DDIMER in the last 168 hours.  Radiology/Studies:  No results found.  Assessment and Plan:   1. Persistent AFib     CHA2DS2Vasc is 2, on Eliquis, appropriately dosed     K+ 4.5     Mag  2.3     Creat 1.43 (Calc CrCl is 60)  Historically his Creat runs a bit higher     QTc is  OK  Will start 237mcg DCCV Thursday if not in SR  2. HTN     Continue home meds  3. NICM     No evidence of fluid OL by today's exam     Continue homoe meds   For questions or updates, please contact [facility] Please consult [email or web address] for contact info under        Signed, Baldwin Jamaica, PA-C  04/19/2018 12:55 PM    I have seen, examined the patient, and reviewed the above assessment and plan.  Changes to above are made where necessary.  On exam, iRRR.  Pt admitted for tikosyn.  He reports compliance with anticoagualtion without interruption.  Initiate tikosyn at this time.  EP to follow closely while here.  Co Sign: Thompson Grayer, MD 04/19/2018 5:54 PM

## 2018-04-20 DIAGNOSIS — I428 Other cardiomyopathies: Secondary | ICD-10-CM

## 2018-04-20 LAB — BASIC METABOLIC PANEL
Anion gap: 8 (ref 5–15)
BUN: 24 mg/dL — ABNORMAL HIGH (ref 8–23)
CALCIUM: 9 mg/dL (ref 8.9–10.3)
CO2: 26 mmol/L (ref 22–32)
CREATININE: 1.4 mg/dL — AB (ref 0.61–1.24)
Chloride: 105 mmol/L (ref 98–111)
GFR calc Af Amer: 57 mL/min — ABNORMAL LOW (ref >60)
GFR, EST NON AFRICAN AMERICAN: 49 mL/min — AB (ref >60)
Glucose, Bld: 98 mg/dL (ref 70–99)
Potassium: 3.8 mmol/L (ref 3.5–5.1)
SODIUM: 139 mmol/L (ref 135–145)

## 2018-04-20 LAB — MAGNESIUM: Magnesium: 2.3 mg/dL (ref 1.7–2.4)

## 2018-04-20 MED ORDER — SODIUM CHLORIDE 0.9 % IV SOLN
250.0000 mL | INTRAVENOUS | Status: DC
  Administered 2018-04-21: 500 mL via INTRAVENOUS

## 2018-04-20 MED ORDER — SODIUM CHLORIDE 0.9% FLUSH
3.0000 mL | Freq: Two times a day (BID) | INTRAVENOUS | Status: DC
  Administered 2018-04-20 – 2018-04-22 (×3): 3 mL via INTRAVENOUS

## 2018-04-20 MED ORDER — POTASSIUM CHLORIDE CRYS ER 20 MEQ PO TBCR
40.0000 meq | EXTENDED_RELEASE_TABLET | Freq: Once | ORAL | Status: AC
  Administered 2018-04-20: 40 meq via ORAL
  Filled 2018-04-20: qty 2

## 2018-04-20 MED ORDER — POTASSIUM CHLORIDE CRYS ER 20 MEQ PO TBCR: 20.0000 meq | EXTENDED_RELEASE_TABLET | Freq: Once | ORAL | Status: DC

## 2018-04-20 MED ORDER — SODIUM CHLORIDE 0.9% FLUSH: 3.0000 mL | INTRAVENOUS | Status: DC | PRN

## 2018-04-20 MED ORDER — HYDROCORTISONE 1 % EX CREA
1.0000 "application " | TOPICAL_CREAM | Freq: Three times a day (TID) | CUTANEOUS | Status: DC | PRN
  Filled 2018-04-20: qty 28

## 2018-04-20 NOTE — H&P (View-Only) (Signed)
   Progress Note   Subjective   Doing well today, the patient denies CP or SOB.  No new concerns  Inpatient Medications    Scheduled Meds: . apixaban  5 mg Oral BID  . dofetilide  250 mcg Oral BID  . furosemide  40 mg Oral Daily  . losartan  50 mg Oral Daily  . metoprolol succinate  75 mg Oral Daily  . multivitamin with minerals  1 tablet Oral QPM  . omega-3 acid ethyl esters  1 g Oral Daily  . pravastatin  80 mg Oral QHS  . sodium chloride flush  3 mL Intravenous Q12H  . tamsulosin  0.4 mg Oral QPC breakfast   Continuous Infusions: . sodium chloride     PRN Meds: sodium chloride, sodium chloride flush   Vital Signs    Vitals:   04/19/18 1241 04/19/18 2039 04/20/18 0452  BP: 90/78 123/86 112/85  Pulse: 76 68 85  Resp:  16 20  Temp: 97.8 F (36.6 C) 97.8 F (36.6 C) 97.9 F (36.6 C)  TempSrc: Oral Oral Oral  SpO2: 100% 96% 97%    Intake/Output Summary (Last 24 hours) at 04/20/2018 0840 Last data filed at 04/19/2018 2124 Gross per 24 hour  Intake 243 ml  Output -  Net 243 ml   There were no vitals filed for this visit.  Telemetry    afib - Personally Reviewed  Physical Exam   GEN- The patient is well appearing, alert and oriented x 3 today.   Head- normocephalic, atraumatic Eyes-  Sclera clear, conjunctiva pink Ears- hearing intact Oropharynx- clear Neck- supple, Lungs- Clear to ausculation bilaterally, normal work of breathing Heart- irregular rate and rhythm  GI- soft, NT, ND, + BS Extremities- no clubbing, cyanosis, or edema  MS- no significant deformity or atrophy Skin- no rash or lesion Psych- euthymic mood, full affect Neuro- strength and sensation are intact   Labs    Chemistry Recent Labs  Lab 04/19/18 1106 04/20/18 0416  NA 140 139  K 4.5 3.8  CL 99 105  CO2 30 26  GLUCOSE 92 98  BUN 21 24*  CREATININE 1.43* 1.40*  CALCIUM 9.5 9.0  GFRNONAA 48* 49*  GFRAA 55* 57*  ANIONGAP 11 8     HematologyNo results for input(s):  WBC, RBC, HGB, HCT, MCV, MCH, MCHC, RDW, PLT in the last 168 hours.  Cardiac EnzymesNo results for input(s): TROPONINI in the last 168 hours. No results for input(s): TROPIPOC in the last 168 hours.      Assessment & Plan    1.  Persistent afib Continue tikosyn load Creatinine, mg, k reviewed Will give Kdur 20 meq x 1 now Continue on eliquis Plan for cardioversion tomorrow if still in AF Will make NPO after midnight  2. HTN Stable No change required today  3. Nonischemic CM Stable No change required today   Thompson Grayer MD, [facility] 04/20/2018 8:40 AM

## 2018-04-20 NOTE — Discharge Instructions (Signed)

## 2018-04-20 NOTE — Progress Notes (Signed)
   Progress Note   Subjective   Doing well today, the patient denies CP or SOB.  No new concerns  Inpatient Medications    Scheduled Meds: . apixaban  5 mg Oral BID  . dofetilide  250 mcg Oral BID  . furosemide  40 mg Oral Daily  . losartan  50 mg Oral Daily  . metoprolol succinate  75 mg Oral Daily  . multivitamin with minerals  1 tablet Oral QPM  . omega-3 acid ethyl esters  1 g Oral Daily  . pravastatin  80 mg Oral QHS  . sodium chloride flush  3 mL Intravenous Q12H  . tamsulosin  0.4 mg Oral QPC breakfast   Continuous Infusions: . sodium chloride     PRN Meds: sodium chloride, sodium chloride flush   Vital Signs    Vitals:   04/19/18 1241 04/19/18 2039 04/20/18 0452  BP: 90/78 123/86 112/85  Pulse: 76 68 85  Resp:  16 20  Temp: 97.8 F (36.6 C) 97.8 F (36.6 C) 97.9 F (36.6 C)  TempSrc: Oral Oral Oral  SpO2: 100% 96% 97%    Intake/Output Summary (Last 24 hours) at 04/20/2018 0840 Last data filed at 04/19/2018 2124 Gross per 24 hour  Intake 243 ml  Output -  Net 243 ml   There were no vitals filed for this visit.  Telemetry    afib - Personally Reviewed  Physical Exam   GEN- The patient is well appearing, alert and oriented x 3 today.   Head- normocephalic, atraumatic Eyes-  Sclera clear, conjunctiva pink Ears- hearing intact Oropharynx- clear Neck- supple, Lungs- Clear to ausculation bilaterally, normal work of breathing Heart- irregular rate and rhythm  GI- soft, NT, ND, + BS Extremities- no clubbing, cyanosis, or edema  MS- no significant deformity or atrophy Skin- no rash or lesion Psych- euthymic mood, full affect Neuro- strength and sensation are intact   Labs    Chemistry Recent Labs  Lab 04/19/18 1106 04/20/18 0416  NA 140 139  K 4.5 3.8  CL 99 105  CO2 30 26  GLUCOSE 92 98  BUN 21 24*  CREATININE 1.43* 1.40*  CALCIUM 9.5 9.0  GFRNONAA 48* 49*  GFRAA 55* 57*  ANIONGAP 11 8     HematologyNo results for input(s):  WBC, RBC, HGB, HCT, MCV, MCH, MCHC, RDW, PLT in the last 168 hours.  Cardiac EnzymesNo results for input(s): TROPONINI in the last 168 hours. No results for input(s): TROPIPOC in the last 168 hours.      Assessment & Plan    1.  Persistent afib Continue tikosyn load Creatinine, mg, k reviewed Will give Kdur 20 meq x 1 now Continue on eliquis Plan for cardioversion tomorrow if still in AF Will make NPO after midnight  2. HTN Stable No change required today  3. Nonischemic CM Stable No change required today   Thompson Grayer MD, [facility] 04/20/2018 8:40 AM

## 2018-04-20 NOTE — Care Management Note (Signed)
Case Management Note  Patient Details  Name: John Cobb MRN: [id number] Date of Birth: 01-28-1946  Subjective/Objective:  Pt Presented for Tikosyn Load- Persistent Atrial Fib.PTA independent from home. Plan will be to return home once stable. Pt is aware of co pay and per patient will be affordable.                  Action/Plan: Rx for 7 day supply to be sent to [facility] onsite. An additional Rx with refills to be written and sent to [facility]. No further needs from CM at this time.   Expected Discharge Date:  04/22/18               Expected Discharge Plan:  Home/Self Care  In-House Referral:  NA  Discharge planning Services  CM Consult, Medication Assistance  Post Acute Care Choice:  NA Choice offered to:  NA  DME Arranged:  N/A DME Agency:  NA  HH Arranged:  NA HH Agency:  NA  Status of Service:  Completed, signed off  If discussed at [place] of Stay Meetings, dates discussed:    Additional Comments:  Bethena Roys, RN 04/20/2018, 10:33 AM

## 2018-04-21 ENCOUNTER — Inpatient Hospital Stay (HOSPITAL_COMMUNITY): Payer: Medicare Other | Admitting: Anesthesiology

## 2018-04-21 ENCOUNTER — Ambulatory Visit (HOSPITAL_COMMUNITY): Admission: AD | Admit: 2018-04-21 | Payer: Medicare Other | Source: Ambulatory Visit | Admitting: Cardiovascular Disease

## 2018-04-21 ENCOUNTER — Encounter (HOSPITAL_COMMUNITY)
Admission: RE | Disposition: A | Discharge status: Home / Self Care | Payer: Self-pay | Source: Ambulatory Visit | Attending: Internal Medicine

## 2018-04-21 ENCOUNTER — Encounter (HOSPITAL_COMMUNITY): Payer: Self-pay | Admitting: *Deleted

## 2018-04-21 DIAGNOSIS — Z79899 Other long term (current) drug therapy: Secondary | ICD-10-CM

## 2018-04-21 DIAGNOSIS — I519 Heart disease, unspecified: Secondary | ICD-10-CM

## 2018-04-21 DIAGNOSIS — I4819 Other persistent atrial fibrillation: Secondary | ICD-10-CM

## 2018-04-21 HISTORY — PX: CARDIOVERSION: SHX1299

## 2018-04-21 LAB — BASIC METABOLIC PANEL
ANION GAP: 7 (ref 5–15)
BUN: 26 mg/dL — AB (ref 8–23)
CO2: 28 mmol/L (ref 22–32)
CREATININE: 1.34 mg/dL — AB (ref 0.61–1.24)
Calcium: 8.9 mg/dL (ref 8.9–10.3)
Chloride: 106 mmol/L (ref 98–111)
GFR calc non Af Amer: 52 mL/min — ABNORMAL LOW (ref >60)
GFR, EST AFRICAN AMERICAN: 60 mL/min — AB (ref >60)
Glucose, Bld: 102 mg/dL — ABNORMAL HIGH (ref 70–99)
Potassium: 4.3 mmol/L (ref 3.5–5.1)
SODIUM: 141 mmol/L (ref 135–145)

## 2018-04-21 LAB — MAGNESIUM: Magnesium: 2.2 mg/dL (ref 1.7–2.4)

## 2018-04-21 SURGERY — CARDIOVERSION
Anesthesia: General

## 2018-04-21 MED ORDER — PROPOFOL 10 MG/ML IV BOLUS
INTRAVENOUS | Status: DC | PRN
  Administered 2018-04-21: 20 mg via INTRAVENOUS
  Administered 2018-04-21: 50 mg via INTRAVENOUS

## 2018-04-21 MED ORDER — LIDOCAINE 2% (20 MG/ML) 5 ML SYRINGE
INTRAMUSCULAR | Status: DC | PRN
  Administered 2018-04-21: 60 mg via INTRAVENOUS

## 2018-04-21 MED ORDER — OFF THE BEAT BOOK
Freq: Once | Status: AC
  Administered 2018-04-21: 18:00:00
  Filled 2018-04-21: qty 1

## 2018-04-21 NOTE — Anesthesia Postprocedure Evaluation (Signed)
Anesthesia Post Note  Patient: John Cobb  Procedure(s) Performed: CARDIOVERSION (N/A )     Patient location during evaluation: PACU Anesthesia Type: General Level of consciousness: sedated Pain management: pain level controlled Vital Signs Assessment: post-procedure vital signs reviewed and stable Respiratory status: spontaneous breathing Cardiovascular status: stable Postop Assessment: no apparent nausea or vomiting Anesthetic complications: no    Last Vitals:  Vitals:   04/21/18 0618 04/21/18 1215  BP: 110/86 138/89  Pulse: (!) 112   Resp: 20 17  Temp: 36.7 C 36.4 C  SpO2: 99% 100%    Last Pain:  Vitals:   04/21/18 1215  TempSrc: Oral  PainSc: 0-No pain   Pain Goal: Patients Stated Pain Goal: 0 (04/21/18 0400)               Derald Lorge JR,JOHN Mate[facility]

## 2018-04-21 NOTE — Progress Notes (Addendum)
Progress Note  Patient Name: John Cobb Date of Encounter: 04/21/2018  Primary Cardiologist: Fransico Him, MD   Subjective   No complaints, worried the dccv won't work  Inpatient Medications    Scheduled Meds: . apixaban  5 mg Oral BID  . dofetilide  250 mcg Oral BID  . furosemide  40 mg Oral Daily  . losartan  50 mg Oral Daily  . metoprolol succinate  75 mg Oral Daily  . multivitamin with minerals  1 tablet Oral QPM  . omega-3 acid ethyl esters  1 g Oral Daily  . pravastatin  80 mg Oral QHS  . sodium chloride flush  3 mL Intravenous Q12H  . sodium chloride flush  3 mL Intravenous Q12H  . tamsulosin  0.4 mg Oral QPC breakfast   Continuous Infusions: . sodium chloride    . sodium chloride     PRN Meds: sodium chloride, hydrocortisone cream, sodium chloride flush, sodium chloride flush   Vital Signs    Vitals:   04/20/18 0452 04/20/18 1425 04/20/18 2112 04/21/18 0618  BP: 112/85 96/73 108/87 110/86  Pulse: 85 (!) 103 98 (!) 112  Resp: 20 16 20 20   Temp: 97.9 F (36.6 C) (!) 97.5 F (36.4 C) 97.9 F (36.6 C) 98 F (36.7 C)  TempSrc: Oral Axillary Oral Oral  SpO2: 97% 100% 99% 99%  Weight:    89.3 kg    Intake/Output Summary (Last 24 hours) at 04/21/2018 0854 Last data filed at 04/20/2018 2200 Gross per 24 hour  Intake 960 ml  Output -  Net 960 ml   Filed Weights   04/21/18 0618  Weight: 89.3 kg    Telemetry    AFib CVR - Personally Reviewed  ECG    AFib 97bpm, QTx 476ms - Personally Reviewed  Physical Exam   GEN: No acute distress.   Neck: No JVD Cardiac: irreg-irreg, no murmurs, rubs, or gallops.  Respiratory: CTA abl. GI: Soft, nontender, non-distended  MS: No edema; No deformity. Neuro:  Nonfocal  Psych: Normal affect   Labs    Chemistry Recent Labs  Lab 04/19/18 1106 04/20/18 0416 04/21/18 0413  NA 140 139 141  K 4.5 3.8 4.3  CL 99 105 106  CO2 30 26 28   GLUCOSE 92 98 102*  BUN 21 24* 26*  CREATININE 1.43* 1.40*  1.34*  CALCIUM 9.5 9.0 8.9  GFRNONAA 48* 49* 52*  GFRAA 55* 57* 60*  ANIONGAP 11 8 7      HematologyNo results for input(s): WBC, RBC, HGB, HCT, MCV, MCH, MCHC, RDW, PLT in the last 168 hours.  Cardiac EnzymesNo results for input(s): TROPONINI in the last 168 hours. No results for input(s): TROPIPOC in the last 168 hours.   BNPNo results for input(s): BNP, PROBNP in the last 168 hours.   DDimer No results for input(s): DDIMER in the last 168 hours.   Radiology    No results found.  Cardiac Studies   01/20/18: LHC  Normal left main  Proximal to mid LAD 30% eccentric narrowing at the bifurcation with a large diagonal. The first diagonal contains 30% eccentric stenosis. The  Circumflex origin to 2 obtuse marginal branches, both of which are widely patent.  RCA is not well-visualized. Ostial and mid vessel luminal irregularities. No significant obstruction.  Normal pulmonary pressures. LVEDP 17 mmHg. Pulmonary capillary wedge mean is 18 to 20 mmHg.  Nonischemic cardiomyopathy, possibly related to atrial fibrillation and tachycardia induced mechanism.  01/17/18: TTE Study Conclusions -  Left ventricle: The cavity size was mildly dilated. Systolic function was severely reduced. The estimated ejection fraction was in the range of 20% to 25%. Severe diffuse hypokinesis with regional variations. The study was not technically sufficient to allow evaluation of LV diastolic dysfunction due to atrial fibrillation. Doppler parameters are consistent with high ventricular filling pressure. - Aortic valve: Trileaflet; mildly thickened, mildly calcified leaflets. There was trivial regurgitation. - Mitral valve: Calcified annulus. There was trivial regurgitation. - Left atrium: The atrium was mildly dilated. - Tricuspid valve: There was trivial regurgitation. - Pulmonary arteries: PA peak pressure: 42 mm Hg (S). Impressions: - The right ventricular systolic pressure  was increased consistent with mild pulmonary hypertension.    Patient Profile     72 y.o. male with hypertension, hyperlipidemia and newly diagnosed atrial fibrillation admitted for Tikosyn initiation  Assessment & Plan    1. Persistent AFib     CHA2DS2Vasc is 2, on Eliquis, appropriately dosed     K+ 4.3     Mag 2.2     Creat 1.34     QTc is OK  On 258mcg dosing given historically Creat cl 60 and lower (56-60), though here has had some improvement, doubt that will be the case long term  DCCV today Anticipate discharge tomorrow  2. HTN     Continue home meds  3. NICM     No evidence of fluid OL by today's exam     Continue homoe meds   For questions or updates, please contact [facility] Please consult [email or web address] for contact info under        Signed, Baldwin Jamaica, PA-C  04/21/2018, 8:54 AM     I have seen, examined the patient, and reviewed the above assessment and plan.  Changes to above are made where necessary.  On exam, iRRR.  QTc is stable.  Plans for cardioversion today discussed.  BP stable.  Euvolemic.  Anticipate discharge tomorrow  Co Sign: Thompson Grayer, MD 04/21/2018

## 2018-04-21 NOTE — Plan of Care (Signed)
Tikosyn Load. Remains in Afib. POC: CV today.

## 2018-04-21 NOTE — Interval H&P Note (Signed)
History and Physical Interval Note:  04/21/2018 12:15 PM  John Cobb  has presented today for surgery, with the diagnosis of a fib, tikosyn admit  The various methods of treatment have been discussed with the patient and family. After consideration of risks, benefits and other options for treatment, the patient has consented to  Procedure(s): CARDIOVERSION (N/A) as a surgical intervention .  The patient's history has been reviewed, patient examined, no change in status, stable for surgery.  I have reviewed the patient's chart and labs.  Questions were answered to the patient's satisfaction.     [name],  MD

## 2018-04-21 NOTE — CV Procedure (Signed)
Electrical Cardioversion Procedure Note KINSEY COWSERT [id number] [birth year]  Procedure: Electrical Cardioversion Indications:  Atrial Fibrillation  Procedure Details Consent: Risks of procedure as well as the alternatives and risks of each were explained to the (patient/caregiver).  Consent for procedure obtained. Time Out: Verified patient identification, verified procedure, site/side was marked, verified correct patient position, special equipment/implants available, medications/allergies/relevent history reviewed, required imaging and test results available.  Performed  Patient placed on cardiac monitor, pulse oximetry, supplemental oxygen as necessary.  Sedation given: propofol Pacer pads placed anterior and posterior chest.  Cardioverted 1 time(s).  Cardioverted at 150J.  Evaluation Findings: Post procedure EKG shows: sinus bradycardia with PVCs Complications: None Patient did tolerate procedure well.   [name], MD 04/21/2018, 12:31 PM

## 2018-04-21 NOTE — Anesthesia Preprocedure Evaluation (Signed)
Anesthesia Evaluation  Patient identified by MRN, date of birth, ID band Patient awake    Reviewed: Allergy & Precautions, NPO status , Patient's Chart, lab work & pertinent test results  Airway Mallampati: II       Dental no notable dental hx. (+) Teeth Intact   Pulmonary    Pulmonary exam normal breath sounds clear to auscultation       Cardiovascular hypertension, Pt. on medications and Pt. on home beta blockers Normal cardiovascular exam Rhythm:Irregular Rate:Normal     Neuro/Psych negative neurological ROS  negative psych ROS   GI/Hepatic negative GI ROS, Neg liver ROS,   Endo/Other  negative endocrine ROS  Renal/GU Renal InsufficiencyRenal disease  negative genitourinary   Musculoskeletal   Abdominal Normal abdominal exam  (+)   Peds  Hematology negative hematology ROS (+)   Anesthesia Other Findings   Reproductive/Obstetrics                             Anesthesia Physical Anesthesia Plan  ASA: II  Anesthesia Plan: General   Post-op Pain Management:    Induction: Intravenous  PONV Risk Score and Plan:   Airway Management Planned: Natural Airway and Simple Face Mask  Additional Equipment:   Intra-op Plan:   Post-operative Plan:   Informed Consent: I have reviewed the patients History and Physical, chart, labs and discussed the procedure including the risks, benefits and alternatives for the proposed anesthesia with the patient or authorized representative who has indicated his/her understanding and acceptance.   Dental advisory given  Plan Discussed with: CRNA  Anesthesia Plan Comments:         Anesthesia Quick Evaluation

## 2018-04-21 NOTE — Transfer of Care (Signed)
Immediate Anesthesia Transfer of Care Note  Patient: John Cobb  Procedure(s) Performed: CARDIOVERSION (N/A )  Patient Location: Endoscopy Unit  Anesthesia Type:MAC  Level of Consciousness: awake and alert   Airway & Oxygen Therapy: Patient Spontanous Breathing  Post-op Assessment: Report given to RN and Post -op Vital signs reviewed and stable  Post vital signs: Reviewed and stable  Last Vitals:  Vitals Value Taken Time  BP    Temp    Pulse    Resp    SpO2      Last Pain:  Vitals:   04/21/18 1215  TempSrc: Oral  PainSc: 0-No pain      Patients Stated Pain Goal: 0 (13/08/65 7846)  Complications: No apparent anesthesia complications

## 2018-04-22 LAB — BASIC METABOLIC PANEL
Anion gap: 9 (ref 5–15)
BUN: 24 mg/dL — AB (ref 8–23)
CHLORIDE: 105 mmol/L (ref 98–111)
CO2: 28 mmol/L (ref 22–32)
CREATININE: 1.4 mg/dL — AB (ref 0.61–1.24)
Calcium: 9.1 mg/dL (ref 8.9–10.3)
GFR calc Af Amer: 57 mL/min — ABNORMAL LOW (ref >60)
GFR, EST NON AFRICAN AMERICAN: 49 mL/min — AB (ref >60)
GLUCOSE: 97 mg/dL (ref 70–99)
POTASSIUM: 3.8 mmol/L (ref 3.5–5.1)
SODIUM: 142 mmol/L (ref 135–145)

## 2018-04-22 LAB — MAGNESIUM: MAGNESIUM: 2.2 mg/dL (ref 1.7–2.4)

## 2018-04-22 MED ORDER — DOFETILIDE 250 MCG PO CAPS: 250.0000 ug | ORAL_CAPSULE | Freq: Two times a day (BID) | ORAL | 0 refills | Status: DC

## 2018-04-22 MED ORDER — DOFETILIDE 250 MCG PO CAPS: 250.0000 ug | ORAL_CAPSULE | Freq: Two times a day (BID) | ORAL | 6 refills | Status: DC

## 2018-04-22 MED ORDER — METOPROLOL SUCCINATE ER 50 MG PO TB24: 75.0000 mg | ORAL_TABLET | Freq: Every day | ORAL | Status: DC

## 2018-04-22 MED ORDER — POTASSIUM CHLORIDE CRYS ER 20 MEQ PO TBCR
30.0000 meq | EXTENDED_RELEASE_TABLET | Freq: Once | ORAL | Status: AC
  Administered 2018-04-22: 30 meq via ORAL
  Filled 2018-04-22: qty 1

## 2018-04-22 MED ORDER — METOPROLOL SUCCINATE ER 25 MG PO TB24: 25.0000 mg | ORAL_TABLET | Freq: Every day | ORAL | 6 refills | Status: DC

## 2018-04-22 MED ORDER — METOPROLOL SUCCINATE ER 50 MG PO TB24: 50.0000 mg | ORAL_TABLET | Freq: Every day | ORAL | Status: DC

## 2018-04-22 MED ORDER — METOPROLOL SUCCINATE ER 25 MG PO TB24
25.0000 mg | ORAL_TABLET | Freq: Every day | ORAL | Status: DC
  Administered 2018-04-22: 25 mg via ORAL
  Filled 2018-04-22: qty 1

## 2018-04-22 MED FILL — TIKOSYN 250 MCG CAPS: 250 | 7 days supply | Qty: 14 | Fill #0

## 2018-04-22 NOTE — Care Management Important Message (Signed)
Important Message  Patient Details  Name: John Cobb MRN: [id number] Date of Birth: 21-Sep-1945   Medicare Important Message Given:  Yes    [name] 04/22/2018, 3:11 PM

## 2018-04-22 NOTE — Progress Notes (Addendum)
telemetry reviewed, SB 40's overnight, 60's-70's this AM, continue metoprolol EKG reviewed, QTc is ok Anticipate discharge this afternoon.  Tommye Standard, PA_C   Reduce toprol to 25mg  daily Anticipate discharge later today  Thompson Grayer MD, [facility] 04/22/2018 7:53 AM

## 2018-04-22 NOTE — Discharge Summary (Addendum)
ELECTROPHYSIOLOGY PROCEDURE DISCHARGE SUMMARY    Patient ID: John Cobb,  MRN: [id number], DOB/AGE: 71-Jan-1947 71 y.o.  Admit date: 04/19/2018 Discharge date: 04/22/2018  Primary Care Physician: Hulan Fess, MD  Primary Cardiologist: Dr. Radford Pax Electrophysiologist: new, Dr. Rayann Heman  Primary Discharge Diagnosis:  1.  persistent atrial fibrillation status post Tikosyn loading this admission      CHA2DS2Vasc is 2, on Eliquis, appropriately dosed  Secondary Discharge Diagnosis:  1. HTN 2. HLD 3. NICM  Allergies  Allergen Reactions  . Honey Bee Treatment [Bee Venom] Anaphylaxis, Hives and Swelling  . Wasp Venom Anaphylaxis, Hives and Swelling  . Neosporin [Neomycin-Bacitracin Zn-Polymyx] Rash     Procedures This Admission:  1.  Tikosyn loading 2.  Direct current cardioversion on 04/21/18 by Dr Oval Linsey which successfully restored SR.  There were no early apparent complications.   Brief HPI: John GUTRIDGE is a 72 y.o. male with a past medical history as noted above.  He was referred to the AFib clinic in the outpatient setting for treatment options of atrial fibrillation.  Risks, benefits, and alternatives to Tikosyn were reviewed with the patient who wished to proceed.    Hospital Course:  The patient was admitted and Tikosyn was initiated.  Renal function and electrolytes were followed during the hospitalization.  His QTc remained stable.  On 04/21/18 they underwent direct current cardioversion which restored sinus rhythm.   The patient was monitored until discharge on telemetry which demonstrated SB with resting rates 40's-60's with exertion, asymptomatic.  His home Toprol was down-titrated.  On the day of discharge, the patient feels well, he was examined by Dr Rayann Heman who considered them stable for discharge to home.  Follow-up has been arranged with the AFib clinic in 1 week and with Dr Rayann Heman in 4 weeks.   Physical Exam: Vitals:   04/21/18 1442 04/21/18 2143  04/22/18 0513 04/22/18 0805  BP: 107/73 129/79 129/80 109/65  Pulse: 61 (!) 59 (!) 55 (!) 49  Resp:      Temp: 97.6 F (36.4 C) 97.6 F (36.4 C) (!) 97.4 F (36.3 C)   TempSrc: Axillary Oral Oral   SpO2: 99% 100% 100%   Weight:   88.4 kg     GEN- The patient is well appearing, alert and oriented x 3 today.   HEENT: normocephalic, atraumatic; sclera clear, conjunctiva pink; hearing intact; oropharynx clear; neck supple, no JVP Lymph- no cervical lymphadenopathy Lungs- CTA b/l, normal work of breathing.  No wheezes, rales, rhonchi Heart- RRR, no murmurs, rubs or gallops, PMI not laterally displaced GI- soft, non-tender, non-distended Extremities- no clubbing, cyanosis, or edema  MS- no significant deformity or atrophy Skin- warm and dry, no rash or lesion Psych- euthymic mood, full affect Neuro- strength and sensation are intact   Labs:   Lab Results  Component Value Date   WBC 8.1 03/08/2018   HGB 16.5 03/08/2018   HCT 48.1 03/08/2018   MCV 85 03/08/2018   PLT 218 03/08/2018    Recent Labs  Lab 04/22/18 0519  NA 142  K 3.8  CL 105  CO2 28  BUN 24*  CREATININE 1.40*  CALCIUM 9.1  GLUCOSE 97     Discharge Medications:  Allergies as of 04/22/2018      Reactions   Honey Bee Treatment [bee Venom] Anaphylaxis, Hives, Swelling   Wasp Venom Anaphylaxis, Hives, Swelling   Neosporin [neomycin-bacitracin Zn-polymyx] Rash      Medication List    TAKE these medications  alclomethasone 0.05 % cream Commonly known as:  ACLOVATE Apply 1 application topically daily as needed (for rash). Forehead   apixaban 5 MG Tabs tablet Commonly known as:  ELIQUIS Take 1 tablet (5 mg total) by mouth 2 (two) times daily.   dofetilide 250 MCG capsule Commonly known as:  TIKOSYN Take 1 capsule (250 mcg total) by mouth 2 (two) times daily.   fish oil-omega-3 fatty acids 1000 MG capsule Take 1 g by mouth every evening.   furosemide 40 MG tablet Commonly known as:  LASIX Take  1 tablet (40 mg total) by mouth daily.   losartan 100 MG tablet Commonly known as:  COZAAR Take 0.5 tablets (50 mg total) by mouth daily.   metoprolol succinate 25 MG 24 hr tablet Commonly known as:  TOPROL-XL Take 1 tablet (25 mg total) by mouth daily. Start taking on:  04/23/2018 What changed:    additional instructions  Another medication with the same name was removed. Continue taking this medication, and follow the directions you see here.   multivitamin with minerals Tabs tablet Take 1 tablet by mouth every evening.   pravastatin 80 MG tablet Commonly known as:  PRAVACHOL Take 80 mg by mouth at bedtime.   tamsulosin 0.4 MG Caps capsule Commonly known as:  FLOMAX Take 0.4 mg by mouth daily. Take at 12:00       Disposition: Home Discharge Instructions    Diet - low sodium heart healthy   Complete by:  As directed    Increase activity slowly   Complete by:  As directed      Follow-up Information    [facility] Follow up on 04/29/2018.   Specialty:  Cardiology Why:  9:00AM Contact information: [address] [id number] [place] [phone]       Thompson Grayer, MD Follow up on 05/23/2018.   Specialty:  Cardiology Why:  9:15AM Contact information: [place] [address] [phone]           Duration of Discharge Encounter: Greater than 30 minutes including physician time.  Signed, Tommye Standard, PA-C 04/22/2018 11:27 AM   I have seen, examined the patient, and reviewed the above assessment and plan.  Changes to above are made where necessary.  On exam, RRR.  DC to home with close outpatient follow-up in the AF clinic.  Co Sign: Thompson Grayer, MD 04/22/2018 5:48 PM

## 2018-04-22 NOTE — Consult Note (Signed)
[facility] CM Primary Care Navigator  04/22/2018  John Cobb 1946-01-13 [id number]   Met withpatientat the bedsideto identify possible discharge needs. Patientreports having"difficulty breathing, easily tired and irregular heart beats" thatresulted tothis admission.(persistent atrial fibrillation- new, underwent cardioversion, for Tikosyn initiation/ monitoring)  Patientendorses Dr. Hulan Fess with [place] [facility] [place] care provider.   [facility] [place] [facility].   Patientreportsmanaginghisown medications at [employer] use of "pill box" system filled once a week.  Patientverbalized that he has been driving prior to admission but wife John Cobb) will be able to providetransportation to hisdoctors' appointments if needed after discharge.  Patientliveswith wife whowillserve as hisprimary caregiver at home if needed.  Anticipateddischarge planishomeaccording to patient.  Patientvoiced understanding to call primary care provider's office when he returnshomefor a post discharge follow-upvisitwithin1- 2weeksor sooner if needs arise.Patient letter (with PCP's contact number) was provided asa reminder.   Discussed with patient about[facility] CM services available for health managementand resourcesat home butdenies anyneedsor concerns for now.Patientexpressedthat he is capable in handling his health issues and does not feel the need for servicesyet, since wife is there helping him manage his health needsat home. He is knowledgeable on ways to manage health conditions and being followed-[facility] at [facility].  Encouraged patientto seekreferral from primary care provider to [facility] care management if deemednecessaryand appropriate for any services in the near future.  [facility] care management information was provided for future needs  thathemay have.  Patienthad only opted andverbally agreedforEMMI calls to follow-up with his recovery at home.  Referral made for [facility] General calls after discharge.   For additional questions please contact:  Edwena Felty A. Keslyn Teater, BSN, RN-BC [facility] PRIMARY CARE Navigator Cell: [phone]

## 2018-04-23 ENCOUNTER — Encounter (HOSPITAL_COMMUNITY): Payer: Self-pay | Admitting: Cardiovascular Disease

## 2018-04-27 DIAGNOSIS — I428 Other cardiomyopathies: Secondary | ICD-10-CM | POA: Diagnosis not present

## 2018-04-27 DIAGNOSIS — I4891 Unspecified atrial fibrillation: Secondary | ICD-10-CM | POA: Diagnosis not present

## 2018-04-27 DIAGNOSIS — T63441D Toxic effect of venom of bees, accidental (unintentional), subsequent encounter: Secondary | ICD-10-CM | POA: Diagnosis not present

## 2018-04-27 DIAGNOSIS — T63451D Toxic effect of venom of hornets, accidental (unintentional), subsequent encounter: Secondary | ICD-10-CM | POA: Diagnosis not present

## 2018-04-27 DIAGNOSIS — N189 Chronic kidney disease, unspecified: Secondary | ICD-10-CM | POA: Diagnosis not present

## 2018-04-27 DIAGNOSIS — T63461D Toxic effect of venom of wasps, accidental (unintentional), subsequent encounter: Secondary | ICD-10-CM | POA: Diagnosis not present

## 2018-04-29 ENCOUNTER — Encounter (HOSPITAL_COMMUNITY): Payer: Self-pay | Admitting: Nurse Practitioner

## 2018-04-29 ENCOUNTER — Other Ambulatory Visit (HOSPITAL_COMMUNITY): Payer: Self-pay | Admitting: *Deleted

## 2018-04-29 ENCOUNTER — Ambulatory Visit (HOSPITAL_COMMUNITY)
Admit: 2018-04-29 | Discharge: 2018-04-29 | Disposition: A | Discharge status: Home / Self Care | Payer: Medicare Other | Attending: Nurse Practitioner | Admitting: Nurse Practitioner

## 2018-04-29 VITALS — BP 132/80 | HR 53 | Ht 72.0 in | Wt 198.4 lb

## 2018-04-29 DIAGNOSIS — N183 Chronic kidney disease, stage 3 (moderate): Secondary | ICD-10-CM | POA: Diagnosis not present

## 2018-04-29 DIAGNOSIS — I129 Hypertensive chronic kidney disease with stage 1 through stage 4 chronic kidney disease, or unspecified chronic kidney disease: Secondary | ICD-10-CM | POA: Insufficient documentation

## 2018-04-29 DIAGNOSIS — I4819 Other persistent atrial fibrillation: Secondary | ICD-10-CM

## 2018-04-29 DIAGNOSIS — Z79899 Other long term (current) drug therapy: Secondary | ICD-10-CM | POA: Diagnosis not present

## 2018-04-29 DIAGNOSIS — Z8249 Family history of ischemic heart disease and other diseases of the circulatory system: Secondary | ICD-10-CM | POA: Diagnosis not present

## 2018-04-29 DIAGNOSIS — Z7901 Long term (current) use of anticoagulants: Secondary | ICD-10-CM | POA: Diagnosis not present

## 2018-04-29 DIAGNOSIS — E7800 Pure hypercholesterolemia, unspecified: Secondary | ICD-10-CM | POA: Insufficient documentation

## 2018-04-29 DIAGNOSIS — M1990 Unspecified osteoarthritis, unspecified site: Secondary | ICD-10-CM | POA: Insufficient documentation

## 2018-04-29 DIAGNOSIS — I2510 Atherosclerotic heart disease of native coronary artery without angina pectoris: Secondary | ICD-10-CM | POA: Insufficient documentation

## 2018-04-29 LAB — BASIC METABOLIC PANEL
ANION GAP: 7 (ref 5–15)
BUN: 22 mg/dL (ref 8–23)
CO2: 27 mmol/L (ref 22–32)
Calcium: 9.1 mg/dL (ref 8.9–10.3)
Chloride: 107 mmol/L (ref 98–111)
Creatinine, Ser: 1.41 mg/dL — ABNORMAL HIGH (ref 0.61–1.24)
GFR calc Af Amer: 56 mL/min — ABNORMAL LOW (ref >60)
GFR, EST NON AFRICAN AMERICAN: 49 mL/min — AB (ref >60)
GLUCOSE: 105 mg/dL — AB (ref 70–99)
POTASSIUM: 4.1 mmol/L (ref 3.5–5.1)
Sodium: 141 mmol/L (ref 135–145)

## 2018-04-29 LAB — MAGNESIUM: Magnesium: 2.3 mg/dL (ref 1.7–2.4)

## 2018-04-29 MED ORDER — APIXABAN 5 MG PO TABS: 5.0000 mg | ORAL_TABLET | Freq: Two times a day (BID) | ORAL | 2 refills | Status: DC

## 2018-04-29 NOTE — Progress Notes (Addendum)
Primary Care Physician: Hulan Fess, MD Referring Physician: Cardioversion f/u/Dr. Oval Linsey Cardiologist: Dr. Rexene Edison is a 72 y.o. male with a h/o  hypertension, hyperlipidemia and newly diagnosed atrial fibrillation CVR on Toprol and Eliquis for CHA2DS2-VASc of 2. 2D echo performed and showed a severe LV dysfunction ejection fraction 20 to 25% with severe diffuse hypokinesis. Stress test EF 19% with findings of ischemia high risk study that led to cardiac catheterization. Cardiac cath showed nonobstructive CAD and he was felt to have nonischemic cardiomyopathy possibly related to A. fib and tachycardia induced mechanism. He was having shortness of breath that improved with laisx. He was scheduled for cardioversion but it was not able to restore SR. He was seen in the afib clinic to discuss antiarrythmic's. He decided on tikosyn admit but had missed does of eliquis and had to wait the apporpriated period of time.  He is now in the afib clinic for admission for Tikosyn. No benadryl, no missed doses of anticoagulation for at least the last 3 weeks. He is aware of price of drug and can afford .  F/u Tikosyn admit 10/18-pt is in SR. He feels improved. QTc is stable.  Today, he denies symptoms of palpitations, chest pain, shortness of breath, orthopnea, PND, lower extremity edema, dizziness, presyncope, syncope, or neurologic sequela. The patient is tolerating medications without difficulties and is otherwise without complaint today.   Past Medical History:  Diagnosis Date  . Arthritis   . Benign essential HTN 01/05/2018  . Blood transfusion    As an infant  . CKD (chronic kidney disease), stage III ([place])   . Dislocated IOL (intraocular lens), posterior 10/02/2011  . High cholesterol   . Hypertension   . Paroxysmal atrial fibrillation ([place]) 01/05/2018  . Pneumonia ~ 1978  . SOB (shortness of breath) 01/05/2018  . Visit for monitoring Tikosyn therapy 04/19/2018   Past  Surgical History:  Procedure Laterality Date  . ANTERIOR CERVICAL DISCECTOMY  2008  . APPENDECTOMY     "as an infant"  . CARDIOVERSION N/A 03/11/2018   Procedure: CARDIOVERSION;  Surgeon: [name], MD;  [facility] ENDOSCOPY;  Service: Cardiovascular;  Laterality: N/A;  . CARDIOVERSION N/A 04/21/2018   Procedure: CARDIOVERSION;  Surgeon: [name], MD;  Location: [place];  Service: Cardiovascular;  Laterality: N/A;  . CATARACT EXTRACTION W/ INTRAOCULAR LENS IMPLANT  08/13/11   left  . CATARACT EXTRACTION W/ INTRAOCULAR LENS IMPLANT  09/03/11   right  . Colonscopy    . Compound Fractures  06/1980   right arm; repaired with 2 plates and 10 pins.  . FRACTURE SURGERY    . GAS/FLUID EXCHANGE  10/20/2011   Procedure: GAS/FLUID EXCHANGE;  Surgeon: Hayden Pedro, MD;  Location: [place];  Service: Ophthalmology;  Laterality: Right;  . HERNIA REPAIR     "umbilical; as an infant"  . INTRAOCULAR LENS EXCHANGE  10/20/11   right eye  . PARS PLANA VITRECTOMY  10/20/2011   Procedure: PARS PLANA VITRECTOMY WITH 25G REMOVAL/SUTURE INTRAOCULAR LENS;  Surgeon: Hayden Pedro, MD;  Location: [place];  Service: Ophthalmology;  Laterality: Right;  . RIGHT/LEFT HEART CATH AND CORONARY ANGIOGRAPHY N/A 01/20/2018   Procedure: RIGHT/LEFT HEART CATH AND CORONARY ANGIOGRAPHY;  Surgeon: Belva Crome, MD;  Location: [place] CV LAB;  Service: Cardiovascular;  Laterality: N/A;    Current Outpatient Medications  Medication Sig Dispense Refill  . alclomethasone (ACLOVATE) 0.05 % cream Apply 1 application topically daily as needed (for rash).  Forehead    . apixaban (ELIQUIS) 5 MG TABS tablet Take 1 tablet (5 mg total) by mouth 2 (two) times daily. 60 tablet 11  . dofetilide (TIKOSYN) 250 MCG capsule Take 1 capsule (250 mcg total) by mouth 2 (two) times daily. 60 capsule 6  . fish oil-omega-3 fatty acids 1000 MG capsule Take 1 g by mouth every evening.     . furosemide (LASIX) 40 MG tablet Take 1  tablet (40 mg total) by mouth daily. 90 tablet 3  . losartan (COZAAR) 100 MG tablet Take 0.5 tablets (50 mg total) by mouth daily. 45 tablet 3  . metoprolol succinate (TOPROL-XL) 25 MG 24 hr tablet Take 1 tablet (25 mg total) by mouth daily. 30 tablet 6  . Multiple Vitamin (MULITIVITAMIN WITH MINERALS) TABS Take 1 tablet by mouth every evening.     . pravastatin (PRAVACHOL) 80 MG tablet Take 80 mg by mouth at bedtime.     . tamsulosin (FLOMAX) 0.4 MG CAPS capsule Take 0.4 mg by mouth daily. Take at 12:00     No current facility-administered medications for this encounter.     Allergies  Allergen Reactions  . Honey Bee Treatment [Bee Venom] Anaphylaxis, Hives and Swelling  . Wasp Venom Anaphylaxis, Hives and Swelling  . Neosporin [Neomycin-Bacitracin Zn-Polymyx] Rash    Social History   Socioeconomic History  . Marital status: Married    Spouse name: Not on file  . Number of children: Not on file  . Years of education: Not on file  . Highest education level: Not on file  Occupational History  . Not on file  Social Needs  . Financial resource strain: Not on file  . Food insecurity:    Worry: Not on file    Inability: Not on file  . Transportation needs:    Medical: Not on file    Non-medical: Not on file  Tobacco Use  . Smoking status: Never Smoker  . Smokeless tobacco: Former Systems developer    Types: Chew  Substance and Sexual Activity  . Alcohol use: No  . Drug use: No  . Sexual activity: Not Currently  Lifestyle  . Physical activity:    Days per week: Not on file    Minutes per session: Not on file  . Stress: Not on file  Relationships  . Social connections:    Talks on phone: Not on file    Gets together: Not on file    Attends religious service: Not on file    Active member of club or organization: Not on file    Attends meetings of clubs or organizations: Not on file    Relationship status: Not on file  . Intimate partner violence:    Fear of current or ex partner:  Not on file    Emotionally abused: Not on file    Physically abused: Not on file    Forced sexual activity: Not on file  Other Topics Concern  . Not on file  Social History Narrative  . Not on file    Family History  Problem Relation Age of Onset  . Lung cancer Mother   . Stomach cancer Mother   . Heart attack Father   . Anesthesia problems Neg Hx     ROS- All systems are reviewed and negative except as per the HPI above  Physical Exam: Vitals:   04/29/18 0855  BP: 132/80  Pulse: (!) 53  Weight: 90 kg  Height: 6' (1.829 m)   [facility]  Readings from Last 3 Encounters:  04/29/18 90 kg  04/22/18 88.4 kg  04/19/18 89.4 kg    Labs: Lab Results  Component Value Date   NA 142 04/22/2018   K 3.8 04/22/2018   CL 105 04/22/2018   CO2 28 04/22/2018   GLUCOSE 97 04/22/2018   BUN 24 (H) 04/22/2018   CREATININE 1.40 (H) 04/22/2018   CALCIUM 9.1 04/22/2018   MG 2.2 04/22/2018   No results found for: INR No results found for: CHOL, HDL, LDLCALC, TRIG   GEN- The patient is well appearing, alert and oriented x 3 today.   Head- normocephalic, atraumatic Eyes-  Sclera clear, conjunctiva pink Ears- hearing intact Oropharynx- clear Neck- supple, no JVP Lymph- no cervical lymphadenopathy Lungs- Clear to ausculation bilaterally, normal work of breathing Heart- regular rate and rhythm, no murmurs, rubs or gallops, PMI not laterally displaced GI- soft, NT, ND, + BS Extremities- no clubbing, cyanosis, or edema MS- no significant deformity or atrophy Skin- no rash or lesion Psych- euthymic mood, full affect Neuro- strength and sensation are intact  EKG-SR at 53  bpm, qrs int 78 ms, qtc 442 ms    Assessment and Plan: 1. Persisitent afib, failed cardioversion  Feels improved with SR Has Tikosyn from drug store Guidelines for drug reviewed Qtc stable at 442 ms Continue Eliquis 5 mg bid He denies benadryl use  2. LV dysfunction, possibly TMC 2/2 afib Recent LHC with mild non  obstructive CAD Continue furosemide Daily weights Will need f/u echo several months maintaining SR  3. CKD  Previously  calculated crcl at 59.17, based on creatinine of 1.54, on 250 mcg tikosyn bid  F/u with Dr. Rayann Heman 11/11 afib clinic as needed   Butch Penny C. Gokul Waybright, [facility] [address] [place], [place] [address] [phone]

## 2018-05-03 ENCOUNTER — Encounter: Payer: Self-pay | Admitting: Internal Medicine

## 2018-05-20 DIAGNOSIS — T63441D Toxic effect of venom of bees, accidental (unintentional), subsequent encounter: Secondary | ICD-10-CM | POA: Diagnosis not present

## 2018-05-20 DIAGNOSIS — T63461D Toxic effect of venom of wasps, accidental (unintentional), subsequent encounter: Secondary | ICD-10-CM | POA: Diagnosis not present

## 2018-05-20 DIAGNOSIS — T63451D Toxic effect of venom of hornets, accidental (unintentional), subsequent encounter: Secondary | ICD-10-CM | POA: Diagnosis not present

## 2018-05-23 ENCOUNTER — Ambulatory Visit (INDEPENDENT_AMBULATORY_CARE_PROVIDER_SITE_OTHER): Payer: Medicare Other | Admitting: Internal Medicine

## 2018-05-23 ENCOUNTER — Encounter: Payer: Self-pay | Admitting: Internal Medicine

## 2018-05-23 VITALS — BP 118/78 | HR 49 | Ht 72.0 in | Wt 200.8 lb

## 2018-05-23 DIAGNOSIS — I428 Other cardiomyopathies: Secondary | ICD-10-CM

## 2018-05-23 DIAGNOSIS — R000 Tachycardia, unspecified: Secondary | ICD-10-CM | POA: Diagnosis not present

## 2018-05-23 DIAGNOSIS — I43 Cardiomyopathy in diseases classified elsewhere: Secondary | ICD-10-CM

## 2018-05-23 DIAGNOSIS — I4819 Other persistent atrial fibrillation: Secondary | ICD-10-CM

## 2018-05-23 MED ORDER — METOPROLOL SUCCINATE ER 25 MG PO TB24: 12.5000 mg | ORAL_TABLET | Freq: Every day | ORAL | 3 refills | Status: DC

## 2018-05-23 NOTE — Progress Notes (Signed)
PCP: Hulan Fess, MD Primary Cardiologist: Dr Radford Pax Primary EP: Dr Rayann Heman  John Cobb is a 72 y.o. male who presents today for routine electrophysiology followup.  I met him 04/19/18 when admitted for tikosyn (my note reviewed).  He has nonischemic CM (EF 20%), NYHA Class III CHF, and persistent afib.  Since initiation of tikosyn, he has done very well.  He remains in sinus rhythm.  He is clinically much improved.  Fatigue and SOB are resolved.  Today, he denies symptoms of palpitations, chest pain,  lower extremity edema, dizziness, presyncope, or syncope.  The patient is otherwise without complaint today.   Past Medical History:  Diagnosis Date  . Arthritis   . Benign essential HTN 01/05/2018  . Blood transfusion    As an infant  . CKD (chronic kidney disease), stage III ([place])   . Dislocated IOL (intraocular lens), posterior 10/02/2011  . High cholesterol   . Hypertension   . Paroxysmal atrial fibrillation ([place]) 01/05/2018  . Pneumonia ~ 1978  . SOB (shortness of breath) 01/05/2018  . Visit for monitoring Tikosyn therapy 04/19/2018   Past Surgical History:  Procedure Laterality Date  . ANTERIOR CERVICAL DISCECTOMY  2008  . APPENDECTOMY     "as an infant"  . CARDIOVERSION N/A 03/11/2018   Procedure: CARDIOVERSION;  Surgeon: [name], MD;  [facility] ENDOSCOPY;  Service: Cardiovascular;  Laterality: N/A;  . CARDIOVERSION N/A 04/21/2018   Procedure: CARDIOVERSION;  Surgeon: [name], MD;  Location: [place];  Service: Cardiovascular;  Laterality: N/A;  . CATARACT EXTRACTION W/ INTRAOCULAR LENS IMPLANT  08/13/11   left  . CATARACT EXTRACTION W/ INTRAOCULAR LENS IMPLANT  09/03/11   right  . Colonscopy    . Compound Fractures  06/1980   right arm; repaired with 2 plates and 10 pins.  . FRACTURE SURGERY    . GAS/FLUID EXCHANGE  10/20/2011   Procedure: GAS/FLUID EXCHANGE;  Surgeon: Hayden Pedro, MD;  Location: [place];  Service: Ophthalmology;  Laterality:  Right;  . HERNIA REPAIR     "umbilical; as an infant"  . INTRAOCULAR LENS EXCHANGE  10/20/11   right eye  . PARS PLANA VITRECTOMY  10/20/2011   Procedure: PARS PLANA VITRECTOMY WITH 25G REMOVAL/SUTURE INTRAOCULAR LENS;  Surgeon: Hayden Pedro, MD;  Location: [place];  Service: Ophthalmology;  Laterality: Right;  . RIGHT/LEFT HEART CATH AND CORONARY ANGIOGRAPHY N/A 01/20/2018   Procedure: RIGHT/LEFT HEART CATH AND CORONARY ANGIOGRAPHY;  Surgeon: Belva Crome, MD;  Location: [place] CV LAB;  Service: Cardiovascular;  Laterality: N/A;    ROS- all systems are reviewed and negatives except as per HPI above  Current Outpatient Medications  Medication Sig Dispense Refill  . alclomethasone (ACLOVATE) 0.05 % cream Apply 1 application topically daily as needed (for rash). Forehead    . apixaban (ELIQUIS) 5 MG TABS tablet Take 1 tablet (5 mg total) by mouth 2 (two) times daily. 180 tablet 2  . dofetilide (TIKOSYN) 250 MCG capsule Take 1 capsule (250 mcg total) by mouth 2 (two) times daily. 60 capsule 6  . fish oil-omega-3 fatty acids 1000 MG capsule Take 1 g by mouth every evening.     Marland Kitchen losartan (COZAAR) 100 MG tablet Take 0.5 tablets (50 mg total) by mouth daily. 45 tablet 3  . metoprolol succinate (TOPROL-XL) 25 MG 24 hr tablet Take 1 tablet (25 mg total) by mouth daily. 30 tablet 6  . Multiple Vitamin (MULITIVITAMIN WITH MINERALS) TABS Take 1 tablet by  mouth every evening.     . pravastatin (PRAVACHOL) 80 MG tablet Take 80 mg by mouth at bedtime.     . tamsulosin (FLOMAX) 0.4 MG CAPS capsule Take 0.4 mg by mouth daily. Take at 12:00    . furosemide (LASIX) 40 MG tablet Take 1 tablet (40 mg total) by mouth daily. 90 tablet 3   No current facility-administered medications for this visit.     Physical Exam: Vitals:   05/23/18 0911  BP: 118/78  Pulse: (!) 49  SpO2: 96%  Weight: 200 lb 12.8 oz (91.1 kg)  Height: 6' (1.829 m)    GEN- The patient is well appearing, alert and oriented x 3  today.   Head- normocephalic, atraumatic Eyes-  Sclera clear, conjunctiva pink Ears- hearing intact Oropharynx- clear Lungs- Clear to ausculation bilaterally, normal work of breathing Heart- Regular rate and rhythm, no murmurs, rubs or gallops, PMI not laterally displaced GI- soft, NT, ND, + BS Extremities- no clubbing, cyanosis, or edema  Wt Readings from Last 3 Encounters:  05/23/18 200 lb 12.8 oz (91.1 kg)  04/29/18 198 lb 6.4 oz (90 kg)  04/22/18 194 lb 14.4 oz (88.4 kg)    EKG tracing ordered today is personally reviewed and shows sinus rhythm 49 bpm, PR 148 msec, QRS 76 msec, QTc 419 msec  Assessment and Plan:  1. Persistent afib Maintaining sinus rhythm with tikosyn chads2vasc score is at least 3.  He is on eliquis. Qt is stable.  Labs 04/29/18 are reviewed Continue current therapy Reduce toprol to 12.37m daily  2. Nonischemic CM Repeat echo on return to see me in 3 months Reduce toprol to 12.58mdaily today Continue medical therapy for CHF  3. HTN Stable No change required today  Return in 3 months with an echo  JaThompson GrayerD, FAWake Forest Joint Ventures LLC1/05/2018 9:57 AM

## 2018-05-23 NOTE — Patient Instructions (Addendum)
Medication Instructions:  Your physician has recommended you make the following change in your medication:   1.  Reduce your TOPROL XL 25 mg---Take 1/2 tablet by mouth daily.  Labwork: None ordered.  Testing/Procedures: Your physician has requested that you have an echocardiogram. Echocardiography is a painless test that uses sound waves to create images of your heart. It provides your doctor with information about the size and shape of your heart and how well your heart's chambers and valves are working. This procedure takes approximately one hour. There are no restrictions for this procedure.  Please schedule for an ECHO in 3 months prior to follow up appointment with Dr. Rayann Heman.  Follow-Up: Your physician wants you to follow-up in: 3 months with Dr. Rayann Heman.      Any Other Special Instructions Will Be Listed Below (If Applicable).  If you need a refill on your cardiac medications before your next appointment, please call your pharmacy.

## 2018-06-01 ENCOUNTER — Encounter (INDEPENDENT_AMBULATORY_CARE_PROVIDER_SITE_OTHER): Payer: Medicare Other | Admitting: Ophthalmology

## 2018-06-01 DIAGNOSIS — H43812 Vitreous degeneration, left eye: Secondary | ICD-10-CM

## 2018-06-01 DIAGNOSIS — H35033 Hypertensive retinopathy, bilateral: Secondary | ICD-10-CM | POA: Diagnosis not present

## 2018-06-01 DIAGNOSIS — I10 Essential (primary) hypertension: Secondary | ICD-10-CM

## 2018-06-22 DIAGNOSIS — T63461D Toxic effect of venom of wasps, accidental (unintentional), subsequent encounter: Secondary | ICD-10-CM | POA: Diagnosis not present

## 2018-06-22 DIAGNOSIS — T63441D Toxic effect of venom of bees, accidental (unintentional), subsequent encounter: Secondary | ICD-10-CM | POA: Diagnosis not present

## 2018-06-22 DIAGNOSIS — T63451D Toxic effect of venom of hornets, accidental (unintentional), subsequent encounter: Secondary | ICD-10-CM | POA: Diagnosis not present

## 2018-07-15 DIAGNOSIS — Z961 Presence of intraocular lens: Secondary | ICD-10-CM | POA: Diagnosis not present

## 2018-07-15 DIAGNOSIS — H5202 Hypermetropia, left eye: Secondary | ICD-10-CM | POA: Diagnosis not present

## 2018-07-15 DIAGNOSIS — H1820 Unspecified corneal edema: Secondary | ICD-10-CM | POA: Diagnosis not present

## 2018-07-15 DIAGNOSIS — T8522XD Displacement of intraocular lens, subsequent encounter: Secondary | ICD-10-CM | POA: Diagnosis not present

## 2018-07-15 DIAGNOSIS — H16211 Exposure keratoconjunctivitis, right eye: Secondary | ICD-10-CM | POA: Diagnosis not present

## 2018-07-15 DIAGNOSIS — H16141 Punctate keratitis, right eye: Secondary | ICD-10-CM | POA: Diagnosis not present

## 2018-07-18 DIAGNOSIS — H16141 Punctate keratitis, right eye: Secondary | ICD-10-CM | POA: Diagnosis not present

## 2018-07-18 DIAGNOSIS — T63461D Toxic effect of venom of wasps, accidental (unintentional), subsequent encounter: Secondary | ICD-10-CM | POA: Diagnosis not present

## 2018-07-18 DIAGNOSIS — T63451D Toxic effect of venom of hornets, accidental (unintentional), subsequent encounter: Secondary | ICD-10-CM | POA: Diagnosis not present

## 2018-07-18 DIAGNOSIS — T63441D Toxic effect of venom of bees, accidental (unintentional), subsequent encounter: Secondary | ICD-10-CM | POA: Diagnosis not present

## 2018-07-21 DIAGNOSIS — H16141 Punctate keratitis, right eye: Secondary | ICD-10-CM | POA: Diagnosis not present

## 2018-07-27 DIAGNOSIS — H16141 Punctate keratitis, right eye: Secondary | ICD-10-CM | POA: Diagnosis not present

## 2018-07-27 DIAGNOSIS — H1820 Unspecified corneal edema: Secondary | ICD-10-CM | POA: Diagnosis not present

## 2018-08-05 DIAGNOSIS — H16141 Punctate keratitis, right eye: Secondary | ICD-10-CM | POA: Diagnosis not present

## 2018-08-11 DIAGNOSIS — L821 Other seborrheic keratosis: Secondary | ICD-10-CM | POA: Diagnosis not present

## 2018-08-11 DIAGNOSIS — L814 Other melanin hyperpigmentation: Secondary | ICD-10-CM | POA: Diagnosis not present

## 2018-08-11 DIAGNOSIS — D225 Melanocytic nevi of trunk: Secondary | ICD-10-CM | POA: Diagnosis not present

## 2018-08-11 DIAGNOSIS — L309 Dermatitis, unspecified: Secondary | ICD-10-CM | POA: Diagnosis not present

## 2018-08-11 DIAGNOSIS — Z23 Encounter for immunization: Secondary | ICD-10-CM | POA: Diagnosis not present

## 2018-08-11 DIAGNOSIS — L219 Seborrheic dermatitis, unspecified: Secondary | ICD-10-CM | POA: Diagnosis not present

## 2018-08-11 DIAGNOSIS — L719 Rosacea, unspecified: Secondary | ICD-10-CM | POA: Diagnosis not present

## 2018-08-17 DIAGNOSIS — T63441D Toxic effect of venom of bees, accidental (unintentional), subsequent encounter: Secondary | ICD-10-CM | POA: Diagnosis not present

## 2018-08-17 DIAGNOSIS — T63461D Toxic effect of venom of wasps, accidental (unintentional), subsequent encounter: Secondary | ICD-10-CM | POA: Diagnosis not present

## 2018-08-17 DIAGNOSIS — T63451D Toxic effect of venom of hornets, accidental (unintentional), subsequent encounter: Secondary | ICD-10-CM | POA: Diagnosis not present

## 2018-08-24 ENCOUNTER — Ambulatory Visit (HOSPITAL_COMMUNITY): Payer: Medicare Other | Attending: Cardiology

## 2018-08-24 ENCOUNTER — Encounter: Payer: Self-pay | Admitting: Internal Medicine

## 2018-08-24 ENCOUNTER — Ambulatory Visit (INDEPENDENT_AMBULATORY_CARE_PROVIDER_SITE_OTHER): Payer: Medicare Other | Admitting: Internal Medicine

## 2018-08-24 VITALS — BP 134/88 | HR 56 | Ht 72.0 in | Wt 213.4 lb

## 2018-08-24 DIAGNOSIS — I428 Other cardiomyopathies: Secondary | ICD-10-CM

## 2018-08-24 DIAGNOSIS — I4819 Other persistent atrial fibrillation: Secondary | ICD-10-CM

## 2018-08-24 DIAGNOSIS — R000 Tachycardia, unspecified: Secondary | ICD-10-CM | POA: Diagnosis not present

## 2018-08-24 DIAGNOSIS — I10 Essential (primary) hypertension: Secondary | ICD-10-CM | POA: Diagnosis not present

## 2018-08-24 DIAGNOSIS — I43 Cardiomyopathy in diseases classified elsewhere: Secondary | ICD-10-CM | POA: Insufficient documentation

## 2018-08-24 MED ORDER — LOSARTAN POTASSIUM 100 MG PO TABS: 100.0000 mg | ORAL_TABLET | Freq: Every day | ORAL | 3 refills | Status: DC

## 2018-08-24 NOTE — Progress Notes (Signed)
PCP: Hulan Fess, MD Primary Cardiologist: Dr Radford Pax Primary EP: Dr Rayann Heman  John Cobb is a 73 y.o. male who presents today for routine electrophysiology followup.  Since last being seen in our clinic, the patient reports doing very well.  Today, he denies symptoms of palpitations, chest pain, shortness of breath,  lower extremity edema, dizziness, presyncope, or syncope.  The patient is otherwise without complaint today.   Past Medical History:  Diagnosis Date  . Arthritis   . Benign essential HTN 01/05/2018  . Blood transfusion    As an infant  . CKD (chronic kidney disease), stage III ([place])   . Dislocated IOL (intraocular lens), posterior 10/02/2011  . High cholesterol   . Hypertension   . Nonischemic cardiomyopathy ([place]) 04/19/2018  . Persistent atrial fibrillation   . Pneumonia ~ 1978   Past Surgical History:  Procedure Laterality Date  . ANTERIOR CERVICAL DISCECTOMY  2008  . APPENDECTOMY     "as an infant"  . CARDIOVERSION N/A 03/11/2018   Procedure: CARDIOVERSION;  Surgeon: [name], MD;  Location: [facility] ENDOSCOPY;  Service: Cardiovascular;  Laterality: N/A;  . CARDIOVERSION N/A 04/21/2018   Procedure: CARDIOVERSION;  Surgeon: [name], MD;  Location: [place];  Service: Cardiovascular;  Laterality: N/A;  . CATARACT EXTRACTION W/ INTRAOCULAR LENS IMPLANT  08/13/11   left  . CATARACT EXTRACTION W/ INTRAOCULAR LENS IMPLANT  09/03/11   right  . Colonscopy    . Compound Fractures  06/1980   right arm; repaired with 2 plates and 10 pins.  . FRACTURE SURGERY    . GAS/FLUID EXCHANGE  10/20/2011   Procedure: GAS/FLUID EXCHANGE;  Surgeon: Hayden Pedro, MD;  Location: [place];  Service: Ophthalmology;  Laterality: Right;  . HERNIA REPAIR     "umbilical; as an infant"  . INTRAOCULAR LENS EXCHANGE  10/20/11   right eye  . PARS PLANA VITRECTOMY  10/20/2011   Procedure: PARS PLANA VITRECTOMY WITH 25G REMOVAL/SUTURE INTRAOCULAR LENS;  Surgeon: Hayden Pedro,  MD;  Location: [place];  Service: Ophthalmology;  Laterality: Right;  . RIGHT/LEFT HEART CATH AND CORONARY ANGIOGRAPHY N/A 01/20/2018   Procedure: RIGHT/LEFT HEART CATH AND CORONARY ANGIOGRAPHY;  Surgeon: Belva Crome, MD;  Location: [place] CV LAB;  Service: Cardiovascular;  Laterality: N/A;    ROS- all systems are reviewed and negatives except as per HPI above  Current Outpatient Medications  Medication Sig Dispense Refill  . alclomethasone (ACLOVATE) 0.05 % cream Apply 1 application topically daily as needed (for rash). Forehead    . apixaban (ELIQUIS) 5 MG TABS tablet Take 1 tablet (5 mg total) by mouth 2 (two) times daily. 180 tablet 2  . dofetilide (TIKOSYN) 250 MCG capsule Take 1 capsule (250 mcg total) by mouth 2 (two) times daily. 60 capsule 6  . fish oil-omega-3 fatty acids 1000 MG capsule Take 1 g by mouth every evening.     . furosemide (LASIX) 40 MG tablet Take 1 tablet (40 mg total) by mouth daily. 90 tablet 3  . losartan (COZAAR) 100 MG tablet Take 0.5 tablets (50 mg total) by mouth daily. 45 tablet 3  . metoprolol succinate (TOPROL XL) 25 MG 24 hr tablet Take 0.5 tablets (12.5 mg total) by mouth daily. 45 tablet 3  . Multiple Vitamin (MULITIVITAMIN WITH MINERALS) TABS Take 1 tablet by mouth every evening.     . pravastatin (PRAVACHOL) 80 MG tablet Take 80 mg by mouth at bedtime.     . tamsulosin (FLOMAX)  0.4 MG CAPS capsule Take 0.4 mg by mouth daily. Take at 12:00     No current facility-administered medications for this visit.     Physical Exam: Vitals:   08/24/18 1131  BP: 134/88  Pulse: (!) 56  SpO2: 98%  Weight: 213 lb 6.4 oz (96.8 kg)  Height: 6' (1.829 m)    GEN- The patient is well appearing, alert and oriented x 3 today.   Head- normocephalic, atraumatic Eyes-  Sclera clear, conjunctiva pink Ears- hearing intact Oropharynx- clear Lungs- Clear to ausculation bilaterally, normal work of breathing Heart- Regular rate and rhythm, no murmurs, rubs or  gallops, PMI not laterally displaced GI- soft, NT, ND, + BS Extremities- no clubbing, cyanosis, or edema  Wt Readings from Last 3 Encounters:  08/24/18 213 lb 6.4 oz (96.8 kg)  05/23/18 200 lb 12.8 oz (91.1 kg)  04/29/18 198 lb 6.4 oz (90 kg)    EKG tracing ordered today is personally reviewed and shows sinus rhythm 56 bpm, PR 152 msec, QRS 78 msec, QTc 449 msec  Assessment and Plan:  1. Persistent afib Maintaining sinus with tikosyn chads2vasc score is 3.  On eliquis Labs 04/29/18 reviewed Would consider ablation if AF returns  2. Nonischemic CM Echo today reveals EF 55%! Tachycardia mediated CM has resolved.  3. HTN Elevated Increase cozaar to 100mg  daily  Follow-up in AF clinic in 3 months  Thompson Grayer MD, [facility] 08/24/2018 11:58 AM

## 2018-08-24 NOTE — Patient Instructions (Addendum)
Medication Instructions:  Your physician has recommended you make the following change in your medication:   1.  Increase your losartan 100 mg--Take one tablet by mouth daily.  Labwork: None ordered.  Testing/Procedures: None ordered.  Follow-Up: Your physician wants you to follow-up in: 3 months with Ricky at the AFIB clinic.      Any Other Special Instructions Will Be Listed Below (If Applicable).  If you need a refill on your cardiac medications before your next appointment, please call your pharmacy.

## 2018-09-15 DIAGNOSIS — T63441D Toxic effect of venom of bees, accidental (unintentional), subsequent encounter: Secondary | ICD-10-CM | POA: Diagnosis not present

## 2018-09-15 DIAGNOSIS — T63451D Toxic effect of venom of hornets, accidental (unintentional), subsequent encounter: Secondary | ICD-10-CM | POA: Diagnosis not present

## 2018-09-15 DIAGNOSIS — T63461D Toxic effect of venom of wasps, accidental (unintentional), subsequent encounter: Secondary | ICD-10-CM | POA: Diagnosis not present

## 2018-10-19 DIAGNOSIS — T63451D Toxic effect of venom of hornets, accidental (unintentional), subsequent encounter: Secondary | ICD-10-CM | POA: Diagnosis not present

## 2018-10-19 DIAGNOSIS — T63441D Toxic effect of venom of bees, accidental (unintentional), subsequent encounter: Secondary | ICD-10-CM | POA: Diagnosis not present

## 2018-10-19 DIAGNOSIS — T63461D Toxic effect of venom of wasps, accidental (unintentional), subsequent encounter: Secondary | ICD-10-CM | POA: Diagnosis not present

## 2018-11-05 ENCOUNTER — Other Ambulatory Visit: Payer: Self-pay | Admitting: Physician Assistant

## 2018-11-10 ENCOUNTER — Other Ambulatory Visit: Payer: Self-pay

## 2018-11-10 ENCOUNTER — Ambulatory Visit (HOSPITAL_COMMUNITY)
Admission: RE | Admit: 2018-11-10 | Discharge: 2018-11-10 | Disposition: A | Discharge status: Home / Self Care | Payer: Medicare Other | Source: Ambulatory Visit | Attending: Physician Assistant | Admitting: Physician Assistant

## 2018-11-10 ENCOUNTER — Encounter (HOSPITAL_COMMUNITY): Payer: Self-pay | Admitting: Nurse Practitioner

## 2018-11-10 VITALS — BP 148/90 | HR 62 | Ht 72.0 in | Wt 213.0 lb

## 2018-11-10 DIAGNOSIS — I129 Hypertensive chronic kidney disease with stage 1 through stage 4 chronic kidney disease, or unspecified chronic kidney disease: Secondary | ICD-10-CM | POA: Diagnosis not present

## 2018-11-10 DIAGNOSIS — Z801 Family history of malignant neoplasm of trachea, bronchus and lung: Secondary | ICD-10-CM | POA: Insufficient documentation

## 2018-11-10 DIAGNOSIS — Z91030 Bee allergy status: Secondary | ICD-10-CM | POA: Diagnosis not present

## 2018-11-10 DIAGNOSIS — R9431 Abnormal electrocardiogram [ECG] [EKG]: Secondary | ICD-10-CM | POA: Insufficient documentation

## 2018-11-10 DIAGNOSIS — I4819 Other persistent atrial fibrillation: Secondary | ICD-10-CM | POA: Diagnosis not present

## 2018-11-10 DIAGNOSIS — Z91038 Other insect allergy status: Secondary | ICD-10-CM | POA: Insufficient documentation

## 2018-11-10 DIAGNOSIS — Z800 Family history of malignant neoplasm of digestive organs: Secondary | ICD-10-CM | POA: Insufficient documentation

## 2018-11-10 DIAGNOSIS — Z7901 Long term (current) use of anticoagulants: Secondary | ICD-10-CM | POA: Diagnosis not present

## 2018-11-10 DIAGNOSIS — Z79899 Other long term (current) drug therapy: Secondary | ICD-10-CM | POA: Insufficient documentation

## 2018-11-10 DIAGNOSIS — E7800 Pure hypercholesterolemia, unspecified: Secondary | ICD-10-CM | POA: Diagnosis not present

## 2018-11-10 DIAGNOSIS — Z881 Allergy status to other antibiotic agents status: Secondary | ICD-10-CM | POA: Diagnosis not present

## 2018-11-10 DIAGNOSIS — Z8249 Family history of ischemic heart disease and other diseases of the circulatory system: Secondary | ICD-10-CM | POA: Diagnosis not present

## 2018-11-10 DIAGNOSIS — N183 Chronic kidney disease, stage 3 (moderate): Secondary | ICD-10-CM | POA: Diagnosis not present

## 2018-11-10 DIAGNOSIS — I428 Other cardiomyopathies: Secondary | ICD-10-CM | POA: Insufficient documentation

## 2018-11-10 LAB — BASIC METABOLIC PANEL
Anion gap: 11 (ref 5–15)
BUN: 16 mg/dL (ref 8–23)
CO2: 28 mmol/L (ref 22–32)
Calcium: 9.5 mg/dL (ref 8.9–10.3)
Chloride: 101 mmol/L (ref 98–111)
Creatinine, Ser: 1.47 mg/dL — ABNORMAL HIGH (ref 0.61–1.24)
GFR calc Af Amer: 55 mL/min — ABNORMAL LOW (ref >60)
GFR calc non Af Amer: 47 mL/min — ABNORMAL LOW (ref >60)
Glucose, Bld: 127 mg/dL — ABNORMAL HIGH (ref 70–99)
Potassium: 4.2 mmol/L (ref 3.5–5.1)
Sodium: 140 mmol/L (ref 135–145)

## 2018-11-10 LAB — MAGNESIUM: Magnesium: 2.1 mg/dL (ref 1.7–2.4)

## 2018-11-10 NOTE — Progress Notes (Signed)
Primary Care Physician: Hulan Fess, MD Referring Physician: Dr. Lorin Picket is a 73 y.o. male with a h/o afib on Tikosyn here for f/u. He reports that he is doing well without any afib awareness. He is being compliant with meds.  Today, he denies symptoms of palpitations, chest pain, shortness of breath, orthopnea, PND, lower extremity edema, dizziness, presyncope, syncope, or neurologic sequela. The patient is tolerating medications without difficulties and is otherwise without complaint today.   Past Medical History:  Diagnosis Date  . Arthritis   . Benign essential HTN 01/05/2018  . Blood transfusion    As an infant  . CKD (chronic kidney disease), stage III ([place])   . Dislocated IOL (intraocular lens), posterior 10/02/2011  . High cholesterol   . Hypertension   . Nonischemic cardiomyopathy ([place]) 04/19/2018  . Persistent atrial fibrillation   . Pneumonia ~ 1978   Past Surgical History:  Procedure Laterality Date  . ANTERIOR CERVICAL DISCECTOMY  2008  . APPENDECTOMY     "as an infant"  . CARDIOVERSION N/A 03/11/2018   Procedure: CARDIOVERSION;  Surgeon: [name], MD;  Location: [facility] ENDOSCOPY;  Service: Cardiovascular;  Laterality: N/A;  . CARDIOVERSION N/A 04/21/2018   Procedure: CARDIOVERSION;  Surgeon: [name], MD;  Location: [place];  Service: Cardiovascular;  Laterality: N/A;  . CATARACT EXTRACTION W/ INTRAOCULAR LENS IMPLANT  08/13/11   left  . CATARACT EXTRACTION W/ INTRAOCULAR LENS IMPLANT  09/03/11   right  . Colonscopy    . Compound Fractures  06/1980   right arm; repaired with 2 plates and 10 pins.  . FRACTURE SURGERY    . GAS/FLUID EXCHANGE  10/20/2011   Procedure: GAS/FLUID EXCHANGE;  Surgeon: Hayden Pedro, MD;  Location: [place];  Service: Ophthalmology;  Laterality: Right;  . HERNIA REPAIR     "umbilical; as an infant"  . INTRAOCULAR LENS EXCHANGE  10/20/11   right eye  . PARS PLANA VITRECTOMY  10/20/2011   Procedure: PARS  PLANA VITRECTOMY WITH 25G REMOVAL/SUTURE INTRAOCULAR LENS;  Surgeon: Hayden Pedro, MD;  Location: [place];  Service: Ophthalmology;  Laterality: Right;  . RIGHT/LEFT HEART CATH AND CORONARY ANGIOGRAPHY N/A 01/20/2018   Procedure: RIGHT/LEFT HEART CATH AND CORONARY ANGIOGRAPHY;  Surgeon: Belva Crome, MD;  Location: [place] CV LAB;  Service: Cardiovascular;  Laterality: N/A;    Current Outpatient Medications  Medication Sig Dispense Refill  . apixaban (ELIQUIS) 5 MG TABS tablet Take 1 tablet (5 mg total) by mouth 2 (two) times daily. 180 tablet 2  . dofetilide (TIKOSYN) 250 MCG capsule TAKE 1 CAPSULE (250 MCG TOTAL) BY MOUTH 2 (TWO) TIMES DAILY. 180 capsule 2  . fish oil-omega-3 fatty acids 1000 MG capsule Take 1 g by mouth every evening.     . furosemide (LASIX) 40 MG tablet Take 1 tablet (40 mg total) by mouth daily. 90 tablet 3  . losartan (COZAAR) 100 MG tablet Take 1 tablet (100 mg total) by mouth daily. 90 tablet 3  . metoprolol succinate (TOPROL XL) 25 MG 24 hr tablet Take 0.5 tablets (12.5 mg total) by mouth daily. 45 tablet 3  . Multiple Vitamin (MULITIVITAMIN WITH MINERALS) TABS Take 1 tablet by mouth every evening.     . pravastatin (PRAVACHOL) 80 MG tablet Take 80 mg by mouth at bedtime.     . tamsulosin (FLOMAX) 0.4 MG CAPS capsule Take 0.4 mg by mouth daily. Take at 12:00    . alclomethasone (ACLOVATE) 0.05 %  cream Apply 1 application topically daily as needed (for rash). Forehead     No current facility-administered medications for this encounter.     Allergies  Allergen Reactions  . Honey Bee Treatment [Bee Venom] Anaphylaxis, Hives and Swelling  . Wasp Venom Anaphylaxis, Hives and Swelling  . Neosporin [Neomycin-Bacitracin Zn-Polymyx] Rash    Social History   Socioeconomic History  . Marital status: Married    Spouse name: Not on file  . Number of children: Not on file  . Years of education: Not on file  . Highest education level: Not on file  Occupational  History  . Not on file  Social Needs  . Financial resource strain: Not on file  . Food insecurity:    Worry: Not on file    Inability: Not on file  . Transportation needs:    Medical: Not on file    Non-medical: Not on file  Tobacco Use  . Smoking status: Never Smoker  . Smokeless tobacco: Former Systems developer    Types: Chew  Substance and Sexual Activity  . Alcohol use: No  . Drug use: No  . Sexual activity: Not Currently  Lifestyle  . Physical activity:    Days per week: Not on file    Minutes per session: Not on file  . Stress: Not on file  Relationships  . Social connections:    Talks on phone: Not on file    Gets together: Not on file    Attends religious service: Not on file    Active member of club or organization: Not on file    Attends meetings of clubs or organizations: Not on file    Relationship status: Not on file  . Intimate partner violence:    Fear of current or ex partner: Not on file    Emotionally abused: Not on file    Physically abused: Not on file    Forced sexual activity: Not on file  Other Topics Concern  . Not on file  Social History Narrative  . Not on file    Family History  Problem Relation Age of Onset  . Lung cancer Mother   . Stomach cancer Mother   . Heart attack Father   . Anesthesia problems Neg Hx     ROS- All systems are reviewed and negative except as per the HPI above  Physical Exam: Vitals:   11/10/18 1129  BP: (!) 148/90  Pulse: 62  Weight: 96.6 kg  Height: 6' (1.829 m)   Wt Readings from Last 3 Encounters:  11/10/18 96.6 kg  08/24/18 96.8 kg  05/23/18 91.1 kg    Labs: Lab Results  Component Value Date   NA 141 04/29/2018   K 4.1 04/29/2018   CL 107 04/29/2018   CO2 27 04/29/2018   GLUCOSE 105 (H) 04/29/2018   BUN 22 04/29/2018   CREATININE 1.41 (H) 04/29/2018   CALCIUM 9.1 04/29/2018   MG 2.3 04/29/2018   No results found for: INR No results found for: CHOL, HDL, LDLCALC, TRIG   GEN- The patient is  well appearing, alert and oriented x 3 today.   Head- normocephalic, atraumatic Eyes-  Sclera clear, conjunctiva pink Ears- hearing intact Oropharynx- clear Neck- supple, no JVP Lymph- no cervical lymphadenopathy Lungs- Clear to ausculation bilaterally, normal work of breathing Heart- Regular rate and rhythm, no murmurs, rubs or gallops, PMI not laterally displaced GI- soft, NT, ND, + BS Extremities- no clubbing, cyanosis, or edema MS- no significant deformity or atrophy  Skin- no rash or lesion Psych- euthymic mood, full affect Neuro- strength and sensation are intact  EKG- NSR  at 62 bpm, Pr int 146 ms, qrs int 78 ms, qtc 450 ms, qtc stable Epic records reviewed Echo-2/20-IMPRESSIONS    1. The left ventricle has normal systolic function, with an ejection fraction of 55-60%. The cavity size was normal. There is mild concentric left ventricular hypertrophy. Left ventricular diastolic Doppler parameters are consistent with impaired  relaxation.  2. The right ventricle has normal systolic function. The cavity was normal. There is no increase in right ventricular wall thickness.  3. The mitral valve is normal in structure.  4. The tricuspid valve is normal in structure.  5. The aortic valve is tricuspid There is mild thickening and mild calcification of the aortic valve. Aortic valve regurgitation is trivial by color flow Doppler.  6. The pulmonic valve was normal in structure.  7. Right atrial pressure is estimated at 3 mmHg.  FINDINGS  Left Ventricle: The left ventricle has normal systolic function, with an ejection fraction of 55-60%. The cavity size was normal. There is mild concentric left ventricular hypertrophy. Left ventricular diastolic Doppler parameters are consistent with  impaired relaxation Right Ventricle: The right ventricle has normal systolic function. The cavity was normal. There is no increase in right ventricular wall thickness. Left Atrium: left atrial size was  normal in size Right Atrium: right atrial size was normal in size Right atrial pressure is estimated at 3 mmHg. Interatrial Septum: No atrial level shunt detected by color flow Doppler. Pericardium: There is no evidence of pericardial effusion. Mitral Valve: The mitral valve is normal in structure. Mitral valve regurgitation is trivial by color flow Doppler. Tricuspid Valve: The tricuspid valve is normal in structure. Tricuspid valve regurgitation was not visualized by color flow Doppler. Aortic Valve: The aortic valve is tricuspid There is mild thickening and mild calcification of the aortic valve. Aortic valve regurgitation is trivial by color flow Doppler. Pulmonic Valve: The pulmonic valve was normal in structure. Pulmonic valve regurgitation is not visualized by color flow Doppler. Venous: The inferior vena cava measures 1.10 cm, is normal in size with greater than 50% respiratory variability.     Assessment and Plan: 1. Persistent  afib Has been doing very well maintaining SR on tikosyn Continue dofetilide 250 mcg bid Continue metoprolol 12.5 mg daily Bmet/mag today  2. CHA2DS2VASc score of 3 Continue apixaban 5 mg bid   3. [place] Resolved with return of SR  F/u in 3 months  Butch Penny C. Carroll, [facility] [address] [place], [place] [address] [phone]

## 2018-11-14 DIAGNOSIS — T63461D Toxic effect of venom of wasps, accidental (unintentional), subsequent encounter: Secondary | ICD-10-CM | POA: Diagnosis not present

## 2018-11-14 DIAGNOSIS — T63441D Toxic effect of venom of bees, accidental (unintentional), subsequent encounter: Secondary | ICD-10-CM | POA: Diagnosis not present

## 2018-11-14 DIAGNOSIS — T63451D Toxic effect of venom of hornets, accidental (unintentional), subsequent encounter: Secondary | ICD-10-CM | POA: Diagnosis not present

## 2018-11-22 ENCOUNTER — Ambulatory Visit (HOSPITAL_COMMUNITY): Payer: Medicare Other | Admitting: Physician Assistant

## 2018-12-16 DIAGNOSIS — T63441D Toxic effect of venom of bees, accidental (unintentional), subsequent encounter: Secondary | ICD-10-CM | POA: Diagnosis not present

## 2018-12-16 DIAGNOSIS — T63451D Toxic effect of venom of hornets, accidental (unintentional), subsequent encounter: Secondary | ICD-10-CM | POA: Diagnosis not present

## 2018-12-16 DIAGNOSIS — T63461D Toxic effect of venom of wasps, accidental (unintentional), subsequent encounter: Secondary | ICD-10-CM | POA: Diagnosis not present

## 2019-01-18 DIAGNOSIS — T63451D Toxic effect of venom of hornets, accidental (unintentional), subsequent encounter: Secondary | ICD-10-CM | POA: Diagnosis not present

## 2019-01-18 DIAGNOSIS — T63441D Toxic effect of venom of bees, accidental (unintentional), subsequent encounter: Secondary | ICD-10-CM | POA: Diagnosis not present

## 2019-01-18 DIAGNOSIS — T63461D Toxic effect of venom of wasps, accidental (unintentional), subsequent encounter: Secondary | ICD-10-CM | POA: Diagnosis not present

## 2019-01-24 DIAGNOSIS — H18831 Recurrent erosion of cornea, right eye: Secondary | ICD-10-CM | POA: Diagnosis not present

## 2019-01-24 DIAGNOSIS — H16221 Keratoconjunctivitis sicca, not specified as Sjogren's, right eye: Secondary | ICD-10-CM | POA: Diagnosis not present

## 2019-01-24 DIAGNOSIS — H16141 Punctate keratitis, right eye: Secondary | ICD-10-CM | POA: Diagnosis not present

## 2019-02-09 ENCOUNTER — Other Ambulatory Visit: Payer: Self-pay

## 2019-02-09 ENCOUNTER — Encounter (HOSPITAL_COMMUNITY): Payer: Self-pay | Admitting: Nurse Practitioner

## 2019-02-09 ENCOUNTER — Ambulatory Visit (HOSPITAL_COMMUNITY)
Admission: RE | Admit: 2019-02-09 | Discharge: 2019-02-09 | Disposition: A | Discharge status: Home / Self Care | Payer: Medicare Other | Source: Ambulatory Visit | Attending: Nurse Practitioner | Admitting: Nurse Practitioner

## 2019-02-09 VITALS — BP 152/86 | HR 59 | Ht 72.0 in | Wt 204.0 lb

## 2019-02-09 DIAGNOSIS — I129 Hypertensive chronic kidney disease with stage 1 through stage 4 chronic kidney disease, or unspecified chronic kidney disease: Secondary | ICD-10-CM | POA: Insufficient documentation

## 2019-02-09 DIAGNOSIS — H04123 Dry eye syndrome of bilateral lacrimal glands: Secondary | ICD-10-CM | POA: Diagnosis not present

## 2019-02-09 DIAGNOSIS — Z7901 Long term (current) use of anticoagulants: Secondary | ICD-10-CM | POA: Diagnosis not present

## 2019-02-09 DIAGNOSIS — I4819 Other persistent atrial fibrillation: Secondary | ICD-10-CM | POA: Diagnosis not present

## 2019-02-09 DIAGNOSIS — Z79899 Other long term (current) drug therapy: Secondary | ICD-10-CM | POA: Diagnosis not present

## 2019-02-09 DIAGNOSIS — I480 Paroxysmal atrial fibrillation: Secondary | ICD-10-CM | POA: Diagnosis not present

## 2019-02-09 DIAGNOSIS — Z8249 Family history of ischemic heart disease and other diseases of the circulatory system: Secondary | ICD-10-CM | POA: Insufficient documentation

## 2019-02-09 DIAGNOSIS — E7800 Pure hypercholesterolemia, unspecified: Secondary | ICD-10-CM | POA: Diagnosis not present

## 2019-02-09 DIAGNOSIS — I428 Other cardiomyopathies: Secondary | ICD-10-CM | POA: Insufficient documentation

## 2019-02-09 DIAGNOSIS — N183 Chronic kidney disease, stage 3 (moderate): Secondary | ICD-10-CM | POA: Insufficient documentation

## 2019-02-09 LAB — MAGNESIUM: Magnesium: 2 mg/dL (ref 1.7–2.4)

## 2019-02-09 LAB — BASIC METABOLIC PANEL
Anion gap: 9 (ref 5–15)
BUN: 15 mg/dL (ref 8–23)
CO2: 27 mmol/L (ref 22–32)
Calcium: 9 mg/dL (ref 8.9–10.3)
Chloride: 104 mmol/L (ref 98–111)
Creatinine, Ser: 1.36 mg/dL — ABNORMAL HIGH (ref 0.61–1.24)
GFR calc Af Amer: >60 mL/min (ref >60)
GFR calc non Af Amer: 52 mL/min — ABNORMAL LOW (ref >60)
Glucose, Bld: 128 mg/dL — ABNORMAL HIGH (ref 70–99)
Potassium: 4.1 mmol/L (ref 3.5–5.1)
Sodium: 140 mmol/L (ref 135–145)

## 2019-02-09 NOTE — Progress Notes (Signed)
Primary Care Physician: Hulan Fess, MD Referring Physician: Dr. Lorin Picket is a 73 y.o. male with a h/o afib on Tikosyn here for f/u. He reports that he is doing well without any afib awareness. He is being compliant with meds. He is pending a colonoscopy in the near future.  Today, he denies symptoms of palpitations, chest pain, shortness of breath, orthopnea, PND, lower extremity edema, dizziness, presyncope, syncope, or neurologic sequela. The patient is tolerating medications without difficulties and is otherwise without complaint today.   Past Medical History:  Diagnosis Date  . Arthritis   . Benign essential HTN 01/05/2018  . Blood transfusion    As an infant  . CKD (chronic kidney disease), stage III ([place])   . Dislocated IOL (intraocular lens), posterior 10/02/2011  . High cholesterol   . Hypertension   . Nonischemic cardiomyopathy ([place]) 04/19/2018  . Persistent atrial fibrillation   . Pneumonia ~ 1978   Past Surgical History:  Procedure Laterality Date  . ANTERIOR CERVICAL DISCECTOMY  2008  . APPENDECTOMY     "as an infant"  . CARDIOVERSION N/A 03/11/2018   Procedure: CARDIOVERSION;  Surgeon: [name], MD;  Location: [facility] ENDOSCOPY;  Service: Cardiovascular;  Laterality: N/A;  . CARDIOVERSION N/A 04/21/2018   Procedure: CARDIOVERSION;  Surgeon: [name], MD;  Location: [place];  Service: Cardiovascular;  Laterality: N/A;  . CATARACT EXTRACTION W/ INTRAOCULAR LENS IMPLANT  08/13/11   left  . CATARACT EXTRACTION W/ INTRAOCULAR LENS IMPLANT  09/03/11   right  . Colonscopy    . Compound Fractures  06/1980   right arm; repaired with 2 plates and 10 pins.  . FRACTURE SURGERY    . GAS/FLUID EXCHANGE  10/20/2011   Procedure: GAS/FLUID EXCHANGE;  Surgeon: Hayden Pedro, MD;  Location: [place];  Service: Ophthalmology;  Laterality: Right;  . HERNIA REPAIR     "umbilical; as an infant"  . INTRAOCULAR LENS EXCHANGE  10/20/11   right eye  .  PARS PLANA VITRECTOMY  10/20/2011   Procedure: PARS PLANA VITRECTOMY WITH 25G REMOVAL/SUTURE INTRAOCULAR LENS;  Surgeon: Hayden Pedro, MD;  Location: [place];  Service: Ophthalmology;  Laterality: Right;  . RIGHT/LEFT HEART CATH AND CORONARY ANGIOGRAPHY N/A 01/20/2018   Procedure: RIGHT/LEFT HEART CATH AND CORONARY ANGIOGRAPHY;  Surgeon: Belva Crome, MD;  Location: [place] CV LAB;  Service: Cardiovascular;  Laterality: N/A;    Current Outpatient Medications  Medication Sig Dispense Refill  . alclomethasone (ACLOVATE) 0.05 % cream Apply 1 application topically daily as needed (for rash). Forehead    . apixaban (ELIQUIS) 5 MG TABS tablet Take 1 tablet (5 mg total) by mouth 2 (two) times daily. 180 tablet 2  . dofetilide (TIKOSYN) 250 MCG capsule TAKE 1 CAPSULE (250 MCG TOTAL) BY MOUTH 2 (TWO) TIMES DAILY. 180 capsule 2  . fish oil-omega-3 fatty acids 1000 MG capsule Take 1 g by mouth every evening.     . furosemide (LASIX) 40 MG tablet Take 1 tablet (40 mg total) by mouth daily. 90 tablet 3  . losartan (COZAAR) 100 MG tablet Take 1 tablet (100 mg total) by mouth daily. 90 tablet 3  . metoprolol succinate (TOPROL XL) 25 MG 24 hr tablet Take 0.5 tablets (12.5 mg total) by mouth daily. 45 tablet 3  . Multiple Vitamin (MULITIVITAMIN WITH MINERALS) TABS Take 1 tablet by mouth every evening.     . pravastatin (PRAVACHOL) 80 MG tablet Take 80 mg by mouth at  bedtime.     . tamsulosin (FLOMAX) 0.4 MG CAPS capsule Take 0.4 mg by mouth daily. Take at 12:00    . White Petrolatum-Mineral Oil (REFRESH P.M. OP) Apply to eye.     No current facility-administered medications for this encounter.     Allergies  Allergen Reactions  . Honey Bee Treatment [Bee Venom] Anaphylaxis, Hives and Swelling  . Wasp Venom Anaphylaxis, Hives and Swelling  . Neosporin [Neomycin-Bacitracin Zn-Polymyx] Rash    Social History   Socioeconomic History  . Marital status: Married    Spouse name: Not on file  . Number  of children: Not on file  . Years of education: Not on file  . Highest education level: Not on file  Occupational History  . Not on file  Social Needs  . Financial resource strain: Not on file  . Food insecurity    Worry: Not on file    Inability: Not on file  . Transportation needs    Medical: Not on file    Non-medical: Not on file  Tobacco Use  . Smoking status: Never Smoker  . Smokeless tobacco: Former Systems developer    Types: Chew  Substance and Sexual Activity  . Alcohol use: No  . Drug use: No  . Sexual activity: Not Currently  Lifestyle  . Physical activity    Days per week: Not on file    Minutes per session: Not on file  . Stress: Not on file  Relationships  . Social Herbalist on phone: Not on file    Gets together: Not on file    Attends religious service: Not on file    Active member of club or organization: Not on file    Attends meetings of clubs or organizations: Not on file    Relationship status: Not on file  . Intimate partner violence    Fear of current or ex partner: Not on file    Emotionally abused: Not on file    Physically abused: Not on file    Forced sexual activity: Not on file  Other Topics Concern  . Not on file  Social History Narrative  . Not on file    Family History  Problem Relation Age of Onset  . Lung cancer Mother   . Stomach cancer Mother   . Heart attack Father   . Anesthesia problems Neg Hx     ROS- All systems are reviewed and negative except as per the HPI above  Physical Exam: Vitals:   02/09/19 1116  BP: (!) 152/86  Pulse: (!) 59  Weight: 92.5 kg  Height: 6' (1.829 m)   Wt Readings from Last 3 Encounters:  02/09/19 92.5 kg  11/10/18 96.6 kg  08/24/18 96.8 kg    Labs: Lab Results  Component Value Date   NA 140 11/10/2018   K 4.2 11/10/2018   CL 101 11/10/2018   CO2 28 11/10/2018   GLUCOSE 127 (H) 11/10/2018   BUN 16 11/10/2018   CREATININE 1.47 (H) 11/10/2018   CALCIUM 9.5 11/10/2018   MG 2.1  11/10/2018   No results found for: INR No results found for: CHOL, HDL, LDLCALC, TRIG   GEN- The patient is well appearing, alert and oriented x 3 today.   Head- normocephalic, atraumatic Eyes-  Sclera clear, conjunctiva pink Ears- hearing intact Oropharynx- clear Neck- supple, no JVP Lymph- no cervical lymphadenopathy Lungs- Clear to ausculation bilaterally, normal work of breathing Heart- Regular rate and rhythm, no murmurs, rubs  or gallops, PMI not laterally displaced GI- soft, NT, ND, + BS Extremities- no clubbing, cyanosis, or edema MS- no significant deformity or atrophy Skin- no rash or lesion Psych- euthymic mood, full affect Neuro- strength and sensation are intact  EKG-Sinus brady at 59 bpm, PR int 146 ms, qrs int 76 ms, qtc 443 ms Epic records reviewed Echo-2/20-IMPRESSIONS    1. The left ventricle has normal systolic function, with an ejection fraction of 55-60%. The cavity size was normal. There is mild concentric left ventricular hypertrophy. Left ventricular diastolic Doppler parameters are consistent with impaired  relaxation.  2. The right ventricle has normal systolic function. The cavity was normal. There is no increase in right ventricular wall thickness.  3. The mitral valve is normal in structure.  4. The tricuspid valve is normal in structure.  5. The aortic valve is tricuspid There is mild thickening and mild calcification of the aortic valve. Aortic valve regurgitation is trivial by color flow Doppler.  6. The pulmonic valve was normal in structure.  7. Right atrial pressure is estimated at 3 mmHg.  FINDINGS  Left Ventricle: The left ventricle has normal systolic function, with an ejection fraction of 55-60%. The cavity size was normal. There is mild concentric left ventricular hypertrophy. Left ventricular diastolic Doppler parameters are consistent with  impaired relaxation Right Ventricle: The right ventricle has normal systolic function. The  cavity was normal. There is no increase in right ventricular wall thickness. Left Atrium: left atrial size was normal in size Right Atrium: right atrial size was normal in size Right atrial pressure is estimated at 3 mmHg. Interatrial Septum: No atrial level shunt detected by color flow Doppler. Pericardium: There is no evidence of pericardial effusion. Mitral Valve: The mitral valve is normal in structure. Mitral valve regurgitation is trivial by color flow Doppler. Tricuspid Valve: The tricuspid valve is normal in structure. Tricuspid valve regurgitation was not visualized by color flow Doppler. Aortic Valve: The aortic valve is tricuspid There is mild thickening and mild calcification of the aortic valve. Aortic valve regurgitation is trivial by color flow Doppler. Pulmonic Valve: The pulmonic valve was normal in structure. Pulmonic valve regurgitation is not visualized by color flow Doppler. Venous: The inferior vena cava measures 1.10 cm, is normal in size with greater than 50% respiratory variability.     Assessment and Plan: 1. Persistent  afib Has been doing very well maintaining SR on tikosyn Continue dofetilide 250 mcg bid Continue metoprolol 12.5 mg daily Bmet/mag today  2. CHA2DS2VASc score of 3 Continue apixaban 5 mg bid   3. [place] Resolved with return of SR  I filled out a form for [facility] that says that he can stop DOAC 2 days prior to procedure and resume asap after procedure. I will forward form to Dr. Jackalyn Lombard office as they also want cardiac clearance.  F/u in 3 months with Dr. Rayann Heman as he would like to discuss possibility of an ablation  [place]. Pecola Haxton, [facility] [address] [place], [place] [address] [phone]

## 2019-02-10 ENCOUNTER — Telehealth: Payer: Self-pay | Admitting: *Deleted

## 2019-02-10 NOTE — Telephone Encounter (Signed)
   [facility] Pre-operative Risk Assessment    Request for surgical clearance:  1. What type of surgery is being performed? COLONOSCOPY   2. When is this surgery scheduled? TBD   3. What type of clearance is required (medical clearance vs. Pharmacy clearance to hold med vs. Both)? BOTH  4. Are there any medications that need to be held prior to surgery and how long?ELIQUIS X 2 DAYS PRIOR   5. Practice name and name of physician performing surgery? [facility]; DR. Penelope Coop   6. What is your office phone number [phone]    7.   What is your office fax number [phone]  8.   Anesthesia type (None, local, MAC, general) ? NONE LISTED   John Cobb 02/10/2019, 2:18 PM  _________________________________________________________________   (provider comments below)

## 2019-02-10 NOTE — Telephone Encounter (Signed)
   Primary Cardiologist: Fransico Him, MD  Chart reviewed as part of pre-operative protocol coverage. Given past medical history and time since last visit, based on ACC/AHA guidelines, John Cobb would be at acceptable risk for the planned procedure without further cardiovascular testing.    Per office protocol, patient can hold Eliquis for 2 days prior to procedure.    Patient should restart Eliquis on the evening of procedure or day after, at discretion of procedure MD  I will route this recommendation to the requesting party via [place] fax function and remove from pre-op pool.  Please call with questions.  Lyda Jester, PA-C 02/10/2019, 3:32 PM

## 2019-02-10 NOTE — Telephone Encounter (Signed)
Patient with diagnosis of atrial fibrillation on Eliquis for anticoagulation.    Procedure: colonoscopy Date of procedure: TBD  CHADS2-VASc score of  3 (CHF, HTN, AGE)  CrCl  68.2  Platelet count 218  Per office protocol, patient can hold Eliquis for 2 days prior to procedure.    Patient should restart Eliquis on the evening of procedure or day after, at discretion of procedure MD

## 2019-02-13 ENCOUNTER — Other Ambulatory Visit: Payer: Self-pay | Admitting: Physician Assistant

## 2019-02-13 NOTE — Telephone Encounter (Signed)
Pt last saw Roderic Palau, NP on 02/09/19, last labs 02/09/19 Creat 1.36, age 73, weight 92.5kg, based on specified criteria pt is on appropriate dosage of Eliquis 5mg  BID.  Will refill rx.

## 2019-02-15 DIAGNOSIS — T63441D Toxic effect of venom of bees, accidental (unintentional), subsequent encounter: Secondary | ICD-10-CM | POA: Diagnosis not present

## 2019-02-15 DIAGNOSIS — T63451D Toxic effect of venom of hornets, accidental (unintentional), subsequent encounter: Secondary | ICD-10-CM | POA: Diagnosis not present

## 2019-02-15 DIAGNOSIS — T63461D Toxic effect of venom of wasps, accidental (unintentional), subsequent encounter: Secondary | ICD-10-CM | POA: Diagnosis not present

## 2019-03-03 DIAGNOSIS — N401 Enlarged prostate with lower urinary tract symptoms: Secondary | ICD-10-CM | POA: Diagnosis not present

## 2019-03-03 DIAGNOSIS — R972 Elevated prostate specific antigen [PSA]: Secondary | ICD-10-CM | POA: Diagnosis not present

## 2019-03-03 DIAGNOSIS — R351 Nocturia: Secondary | ICD-10-CM | POA: Diagnosis not present

## 2019-03-09 DIAGNOSIS — H168 Other keratitis: Secondary | ICD-10-CM | POA: Diagnosis not present

## 2019-03-09 DIAGNOSIS — H04123 Dry eye syndrome of bilateral lacrimal glands: Secondary | ICD-10-CM | POA: Diagnosis not present

## 2019-03-10 DIAGNOSIS — I10 Essential (primary) hypertension: Secondary | ICD-10-CM | POA: Diagnosis not present

## 2019-03-10 DIAGNOSIS — N189 Chronic kidney disease, unspecified: Secondary | ICD-10-CM | POA: Diagnosis not present

## 2019-03-10 DIAGNOSIS — N400 Enlarged prostate without lower urinary tract symptoms: Secondary | ICD-10-CM | POA: Diagnosis not present

## 2019-03-10 DIAGNOSIS — E782 Mixed hyperlipidemia: Secondary | ICD-10-CM | POA: Diagnosis not present

## 2019-03-13 DIAGNOSIS — H168 Other keratitis: Secondary | ICD-10-CM | POA: Diagnosis not present

## 2019-03-22 DIAGNOSIS — D122 Benign neoplasm of ascending colon: Secondary | ICD-10-CM | POA: Diagnosis not present

## 2019-03-22 DIAGNOSIS — Z86010 Personal history of colonic polyps: Secondary | ICD-10-CM | POA: Diagnosis not present

## 2019-03-24 DIAGNOSIS — D122 Benign neoplasm of ascending colon: Secondary | ICD-10-CM | POA: Diagnosis not present

## 2019-03-31 DIAGNOSIS — T63461D Toxic effect of venom of wasps, accidental (unintentional), subsequent encounter: Secondary | ICD-10-CM | POA: Diagnosis not present

## 2019-03-31 DIAGNOSIS — T63451D Toxic effect of venom of hornets, accidental (unintentional), subsequent encounter: Secondary | ICD-10-CM | POA: Diagnosis not present

## 2019-03-31 DIAGNOSIS — T63441D Toxic effect of venom of bees, accidental (unintentional), subsequent encounter: Secondary | ICD-10-CM | POA: Diagnosis not present

## 2019-04-02 ENCOUNTER — Other Ambulatory Visit: Payer: Self-pay | Admitting: Physician Assistant

## 2019-04-11 DIAGNOSIS — I10 Essential (primary) hypertension: Secondary | ICD-10-CM | POA: Diagnosis not present

## 2019-04-11 DIAGNOSIS — R972 Elevated prostate specific antigen [PSA]: Secondary | ICD-10-CM | POA: Diagnosis not present

## 2019-04-11 DIAGNOSIS — R7301 Impaired fasting glucose: Secondary | ICD-10-CM | POA: Diagnosis not present

## 2019-04-12 DIAGNOSIS — Z86010 Personal history of colonic polyps: Secondary | ICD-10-CM | POA: Diagnosis not present

## 2019-04-12 DIAGNOSIS — E782 Mixed hyperlipidemia: Secondary | ICD-10-CM | POA: Diagnosis not present

## 2019-04-12 DIAGNOSIS — Z0000 Encounter for general adult medical examination without abnormal findings: Secondary | ICD-10-CM | POA: Diagnosis not present

## 2019-04-12 DIAGNOSIS — N189 Chronic kidney disease, unspecified: Secondary | ICD-10-CM | POA: Diagnosis not present

## 2019-04-12 DIAGNOSIS — I428 Other cardiomyopathies: Secondary | ICD-10-CM | POA: Diagnosis not present

## 2019-04-12 DIAGNOSIS — I10 Essential (primary) hypertension: Secondary | ICD-10-CM | POA: Diagnosis not present

## 2019-04-12 DIAGNOSIS — Z23 Encounter for immunization: Secondary | ICD-10-CM | POA: Diagnosis not present

## 2019-04-12 DIAGNOSIS — N400 Enlarged prostate without lower urinary tract symptoms: Secondary | ICD-10-CM | POA: Diagnosis not present

## 2019-04-12 DIAGNOSIS — I4891 Unspecified atrial fibrillation: Secondary | ICD-10-CM | POA: Diagnosis not present

## 2019-04-12 DIAGNOSIS — R7301 Impaired fasting glucose: Secondary | ICD-10-CM | POA: Diagnosis not present

## 2019-04-19 DIAGNOSIS — H168 Other keratitis: Secondary | ICD-10-CM | POA: Diagnosis not present

## 2019-04-28 DIAGNOSIS — T63451D Toxic effect of venom of hornets, accidental (unintentional), subsequent encounter: Secondary | ICD-10-CM | POA: Diagnosis not present

## 2019-04-28 DIAGNOSIS — T63461D Toxic effect of venom of wasps, accidental (unintentional), subsequent encounter: Secondary | ICD-10-CM | POA: Diagnosis not present

## 2019-04-28 DIAGNOSIS — T63441D Toxic effect of venom of bees, accidental (unintentional), subsequent encounter: Secondary | ICD-10-CM | POA: Diagnosis not present

## 2019-05-26 DIAGNOSIS — T63441D Toxic effect of venom of bees, accidental (unintentional), subsequent encounter: Secondary | ICD-10-CM | POA: Diagnosis not present

## 2019-05-26 DIAGNOSIS — T63451D Toxic effect of venom of hornets, accidental (unintentional), subsequent encounter: Secondary | ICD-10-CM | POA: Diagnosis not present

## 2019-05-26 DIAGNOSIS — T63461D Toxic effect of venom of wasps, accidental (unintentional), subsequent encounter: Secondary | ICD-10-CM | POA: Diagnosis not present

## 2019-05-31 ENCOUNTER — Telehealth (INDEPENDENT_AMBULATORY_CARE_PROVIDER_SITE_OTHER): Payer: Medicare Other | Admitting: Internal Medicine

## 2019-05-31 ENCOUNTER — Encounter: Payer: Self-pay | Admitting: Internal Medicine

## 2019-05-31 ENCOUNTER — Other Ambulatory Visit: Payer: Self-pay

## 2019-05-31 VITALS — BP 126/67 | HR 80 | Ht 72.0 in | Wt 211.0 lb

## 2019-05-31 DIAGNOSIS — I10 Essential (primary) hypertension: Secondary | ICD-10-CM

## 2019-05-31 DIAGNOSIS — I480 Paroxysmal atrial fibrillation: Secondary | ICD-10-CM | POA: Diagnosis not present

## 2019-05-31 NOTE — Progress Notes (Signed)
Electrophysiology TeleHealth Note  Due to national recommendations of social distancing due to [place] 19, an audio telehealth visit is felt to be most appropriate for this patient at this time.  Verbal consent was obtained by me for the telehealth visit today.  The patient does not have capability for a virtual visit.  A phone visit is therefore required today.   Date:  05/31/2019   ID:  John Cobb, DOB Aug 29, 1945, MRN [id number]  Location: patient's home  Provider location:  [facility]  Evaluation Performed: Follow-up visit  PCP:  Hulan Fess, MD   Electrophysiologist:  Dr Rayann Heman  Chief Complaint:  palpitations  History of Present Illness:    John Cobb is a 73 y.o. male who presents via telehealth conferencing today.  Since last being seen in our clinic, the patient reports doing very well.  His primary concern is with eye difficulty.  He is being evaluated by an Ophthalmologist.  He is not aware of any afib.   Today, he denies symptoms of palpitations, chest pain, shortness of breath,  lower extremity edema, dizziness, presyncope, or syncope.  The patient is otherwise without complaint today.  The patient denies symptoms of fevers, chills, cough, or new SOB worrisome for COVID 19.  Past Medical History:  Diagnosis Date  . Arthritis   . Benign essential HTN 01/05/2018  . Blood transfusion    As an infant  . CKD (chronic kidney disease), stage III   . Dislocated IOL (intraocular lens), posterior 10/02/2011  . High cholesterol   . Hypertension   . Nonischemic cardiomyopathy ([place]) 04/19/2018  . Persistent atrial fibrillation ([place])   . Pneumonia ~ 1978    Past Surgical History:  Procedure Laterality Date  . ANTERIOR CERVICAL DISCECTOMY  2008  . APPENDECTOMY     "as an infant"  . CARDIOVERSION N/A 03/11/2018   Procedure: CARDIOVERSION;  Surgeon: [name], MD;  Location[facility] ENDOSCOPY;  Service: Cardiovascular;  Laterality: N/A;  . CARDIOVERSION N/A  04/21/2018   Procedure: CARDIOVERSION;  Surgeon: [name], MD;  Location: [place];  Service: Cardiovascular;  Laterality: N/A;  . CATARACT EXTRACTION W/ INTRAOCULAR LENS IMPLANT  08/13/11   left  . CATARACT EXTRACTION W/ INTRAOCULAR LENS IMPLANT  09/03/11   right  . Colonscopy    . Compound Fractures  06/1980   right arm; repaired with 2 plates and 10 pins.  . FRACTURE SURGERY    . GAS/FLUID EXCHANGE  10/20/2011   Procedure: GAS/FLUID EXCHANGE;  Surgeon: Hayden Pedro, MD;  Location: [place];  Service: Ophthalmology;  Laterality: Right;  . HERNIA REPAIR     "umbilical; as an infant"  . INTRAOCULAR LENS EXCHANGE  10/20/11   right eye  . PARS PLANA VITRECTOMY  10/20/2011   Procedure: PARS PLANA VITRECTOMY WITH 25G REMOVAL/SUTURE INTRAOCULAR LENS;  Surgeon: Hayden Pedro, MD;  Location: [place];  Service: Ophthalmology;  Laterality: Right;  . RIGHT/LEFT HEART CATH AND CORONARY ANGIOGRAPHY N/A 01/20/2018   Procedure: RIGHT/LEFT HEART CATH AND CORONARY ANGIOGRAPHY;  Surgeon: Belva Crome, MD;  Location: [place] CV LAB;  Service: Cardiovascular;  Laterality: N/A;    Current Outpatient Medications  Medication Sig Dispense Refill  . alclomethasone (ACLOVATE) 0.05 % cream Apply 1 application topically daily as needed (for rash). Forehead    . dofetilide (TIKOSYN) 250 MCG capsule TAKE 1 CAPSULE (250 MCG TOTAL) BY MOUTH 2 (TWO) TIMES DAILY. 180 capsule 2  . doxazosin (CARDURA) 8 MG tablet Take  8 mg by mouth at bedtime.    Marland Kitchen ELIQUIS 5 MG TABS tablet TAKE 1 TABLET BY MOUTH TWICE A DAY 180 tablet 1  . fish oil-omega-3 fatty acids 1000 MG capsule Take 1 g by mouth every evening.     . furosemide (LASIX) 40 MG tablet TAKE 1 TABLET BY MOUTH EVERY DAY 90 tablet 1  . metoprolol succinate (TOPROL XL) 25 MG 24 hr tablet Take 0.5 tablets (12.5 mg total) by mouth daily. 45 tablet 3  . Multiple Vitamin (MULITIVITAMIN WITH MINERALS) TABS Take 1 tablet by mouth every evening.     . pravastatin  (PRAVACHOL) 80 MG tablet Take 80 mg by mouth at bedtime.     . RESTASIS 0.05 % ophthalmic emulsion Place 1 drop into both eyes 2 (two) times daily.    Marland Kitchen losartan (COZAAR) 100 MG tablet Take 1 tablet (100 mg total) by mouth daily. 90 tablet 3   No current facility-administered medications for this visit.     Allergies:   Honey bee treatment [bee venom], Wasp venom, and Neosporin [neomycin-bacitracin zn-polymyx]   Social History:  The patient  reports that he has never smoked. He quit smokeless tobacco use about 10 years ago.  His smokeless tobacco use included chew. He reports that he does not drink alcohol or use drugs.   Family History:  The patient's  family history includes Heart attack in his father; Lung cancer in his mother; Stomach cancer in his mother.   ROS:  Please see the history of present illness.   All other systems are personally reviewed and negative.    Exam:    Vital Signs:  BP 126/67   Pulse 80   Ht 6' (1.829 m)   Wt 211 lb (95.7 kg)   BMI 28.62 kg/m   Well sounding, alert and conversant   Labs/Other Tests and Data Reviewed:    Recent Labs: 02/09/2019: BUN 15; Creatinine, Ser 1.36; Magnesium 2.0; Potassium 4.1; Sodium 140   Wt Readings from Last 3 Encounters:  05/31/19 211 lb (95.7 kg)  02/09/19 204 lb (92.5 kg)  11/10/18 213 lb (96.6 kg)    Labs from July reviewed ekg from July reviewed    ASSESSMENT & PLAN:    1.  Paroxysmal atrial fibrillation Well controlled with tikosyn On eliquis for chads2vasc score of 3 He worries about the costs and risks of long term anticoagulation. Today, we discussed the possibility of long term monitoring with an implanted loop recorder.  This could be helpful in guiding decisions about long term anticoagulation in the future.  For now, he does not wish to pursue this option  2. Nonischemic CM Resolved with sinus  3. HTN Stable No change required today  Follow-up:  afib clinic in 4 months   Patient Risk:   after full review of this patients clinical status, I feel that they are at moderate risk at this time.  Today, I have spent 15 minutes with the patient with telehealth technology discussing arrhythmia management .    Army Fossa, MD  05/31/2019 9:44 AM     [place] [address] [phone] (office) [phone] (fax)

## 2019-06-09 ENCOUNTER — Other Ambulatory Visit: Payer: Self-pay | Admitting: Cardiology

## 2019-06-22 ENCOUNTER — Ambulatory Visit: Payer: Medicare Other | Admitting: Internal Medicine

## 2019-06-23 ENCOUNTER — Other Ambulatory Visit: Payer: Self-pay | Admitting: Internal Medicine

## 2019-07-19 DIAGNOSIS — T63441D Toxic effect of venom of bees, accidental (unintentional), subsequent encounter: Secondary | ICD-10-CM | POA: Diagnosis not present

## 2019-07-19 DIAGNOSIS — T63451D Toxic effect of venom of hornets, accidental (unintentional), subsequent encounter: Secondary | ICD-10-CM | POA: Diagnosis not present

## 2019-07-19 DIAGNOSIS — T63461D Toxic effect of venom of wasps, accidental (unintentional), subsequent encounter: Secondary | ICD-10-CM | POA: Diagnosis not present

## 2019-07-28 ENCOUNTER — Other Ambulatory Visit: Payer: Self-pay | Admitting: Physician Assistant

## 2019-08-09 DIAGNOSIS — H168 Other keratitis: Secondary | ICD-10-CM | POA: Diagnosis not present

## 2019-08-14 ENCOUNTER — Emergency Department (HOSPITAL_COMMUNITY)
Admission: EM | Admit: 2019-08-14 | Discharge: 2019-08-14 | Disposition: A | Discharge status: Home / Self Care | Payer: Medicare Other | Attending: Emergency Medicine | Admitting: Emergency Medicine

## 2019-08-14 ENCOUNTER — Encounter (HOSPITAL_COMMUNITY): Payer: Self-pay | Admitting: Emergency Medicine

## 2019-08-14 ENCOUNTER — Other Ambulatory Visit: Payer: Self-pay

## 2019-08-14 ENCOUNTER — Emergency Department (HOSPITAL_COMMUNITY): Payer: Medicare Other

## 2019-08-14 DIAGNOSIS — I5022 Chronic systolic (congestive) heart failure: Secondary | ICD-10-CM | POA: Diagnosis not present

## 2019-08-14 DIAGNOSIS — R000 Tachycardia, unspecified: Secondary | ICD-10-CM | POA: Diagnosis not present

## 2019-08-14 DIAGNOSIS — Z79899 Other long term (current) drug therapy: Secondary | ICD-10-CM | POA: Diagnosis not present

## 2019-08-14 DIAGNOSIS — R42 Dizziness and giddiness: Secondary | ICD-10-CM | POA: Diagnosis not present

## 2019-08-14 DIAGNOSIS — Z7901 Long term (current) use of anticoagulants: Secondary | ICD-10-CM | POA: Insufficient documentation

## 2019-08-14 DIAGNOSIS — I428 Other cardiomyopathies: Secondary | ICD-10-CM | POA: Insufficient documentation

## 2019-08-14 DIAGNOSIS — N183 Chronic kidney disease, stage 3 unspecified: Secondary | ICD-10-CM | POA: Diagnosis not present

## 2019-08-14 DIAGNOSIS — I4891 Unspecified atrial fibrillation: Secondary | ICD-10-CM | POA: Diagnosis not present

## 2019-08-14 DIAGNOSIS — I130 Hypertensive heart and chronic kidney disease with heart failure and stage 1 through stage 4 chronic kidney disease, or unspecified chronic kidney disease: Secondary | ICD-10-CM | POA: Diagnosis not present

## 2019-08-14 DIAGNOSIS — I480 Paroxysmal atrial fibrillation: Secondary | ICD-10-CM

## 2019-08-14 DIAGNOSIS — I959 Hypotension, unspecified: Secondary | ICD-10-CM | POA: Diagnosis not present

## 2019-08-14 DIAGNOSIS — I499 Cardiac arrhythmia, unspecified: Secondary | ICD-10-CM | POA: Diagnosis not present

## 2019-08-14 LAB — COMPREHENSIVE METABOLIC PANEL
ALT: 21 U/L (ref 0–44)
AST: 18 U/L (ref 15–41)
Albumin: 3.9 g/dL (ref 3.5–5.0)
Alkaline Phosphatase: 48 U/L (ref 38–126)
Anion gap: 8 (ref 5–15)
BUN: 23 mg/dL (ref 8–23)
CO2: 27 mmol/L (ref 22–32)
Calcium: 8.5 mg/dL — ABNORMAL LOW (ref 8.9–10.3)
Chloride: 106 mmol/L (ref 98–111)
Creatinine, Ser: 1.58 mg/dL — ABNORMAL HIGH (ref 0.61–1.24)
GFR calc Af Amer: 50 mL/min — ABNORMAL LOW (ref >60)
GFR calc non Af Amer: 43 mL/min — ABNORMAL LOW (ref >60)
Glucose, Bld: 132 mg/dL — ABNORMAL HIGH (ref 70–99)
Potassium: 4.4 mmol/L (ref 3.5–5.1)
Sodium: 141 mmol/L (ref 135–145)
Total Bilirubin: 0.8 mg/dL (ref 0.3–1.2)
Total Protein: 6.7 g/dL (ref 6.5–8.1)

## 2019-08-14 LAB — CBC WITH DIFFERENTIAL/PLATELET
Abs Immature Granulocytes: 0.02 10*3/uL (ref 0.00–0.07)
Basophils Absolute: 0.1 10*3/uL (ref 0.0–0.1)
Basophils Relative: 1 %
Eosinophils Absolute: 0.2 10*3/uL (ref 0.0–0.5)
Eosinophils Relative: 3 %
HCT: 44.3 % (ref 39.0–52.0)
Hemoglobin: 14.7 g/dL (ref 13.0–17.0)
Immature Granulocytes: 0 %
Lymphocytes Relative: 17 %
Lymphs Abs: 1.4 10*3/uL (ref 0.7–4.0)
MCH: 30.1 pg (ref 26.0–34.0)
MCHC: 33.2 g/dL (ref 30.0–36.0)
MCV: 90.8 fL (ref 80.0–100.0)
Monocytes Absolute: 0.8 10*3/uL (ref 0.1–1.0)
Monocytes Relative: 9 %
Neutro Abs: 5.9 10*3/uL (ref 1.7–7.7)
Neutrophils Relative %: 70 %
Platelets: 193 10*3/uL (ref 150–400)
RBC: 4.88 MIL/uL (ref 4.22–5.81)
RDW: 12.9 % (ref 11.5–15.5)
WBC: 8.4 10*3/uL (ref 4.0–10.5)
nRBC: 0 % (ref 0.0–0.2)

## 2019-08-14 LAB — TROPONIN I (HIGH SENSITIVITY)
Troponin I (High Sensitivity): 7 ng/L (ref <18)
Troponin I (High Sensitivity): 6 ng/L (ref <18)

## 2019-08-14 LAB — BRAIN NATRIURETIC PEPTIDE: B Natriuretic Peptide: 350 pg/mL — ABNORMAL HIGH (ref 0.0–100.0)

## 2019-08-14 MED ORDER — PROPOFOL 10 MG/ML IV BOLUS
0.5000 mg/kg | Freq: Once | INTRAVENOUS | Status: AC
  Administered 2019-08-14: 49.9 mg via INTRAVENOUS
  Filled 2019-08-14: qty 20

## 2019-08-14 MED ORDER — SODIUM CHLORIDE 0.9 % IV BOLUS
500.0000 mL | Freq: Once | INTRAVENOUS | Status: AC
  Administered 2019-08-14: 500 mL via INTRAVENOUS

## 2019-08-14 NOTE — Discharge Instructions (Addendum)
Take your medications as prescribed and follow-up with your cardiologist.  Return to the ED with chest pain, shortness of breath, any other concerns.

## 2019-08-14 NOTE — ED Provider Notes (Addendum)
[facility] EMERGENCY DEPARTMENT Provider Note   CSN: [id number] Arrival date & time: 08/14/19  1424     History Chief Complaint  Patient presents with  . Atrial Fibrillation    John Cobb is a 74 y.o. male.  Patient with history of atrial fibrillation on Eliquis, nonischemic cardiomyopathy, hypertension sent from urgent care with lightheadedness after being found to be in atrial fibrillation with RVR.  Patient states he felt well going to bed last night.  When he woke up this morning he felt dizzy and lightheaded.  He states his blood pressure at home was in the 70s and he went to urgent care.  He was found to be in atrial fibrillation with RVR and his blood pressure had normalized.  He denies any chest pain, shortness of breath.  No nausea or vomiting.  No focal weakness, numbness or tingling.  No cough or fever. No pain with urination or blood in the urine.  No vomiting or diarrhea.  No change in his regular medications.  States he is taking Tikosyn as well as losartan.  He has been compliant with his Eliquis with no missed doses.  No room spinning dizziness.  Cardiac catheterization in 2019 showed nonobstructive disease.  The history is provided by the patient and the EMS personnel.  Atrial Fibrillation Pertinent negatives include no chest pain, no abdominal pain, no headaches and no shortness of breath.       Past Medical History:  Diagnosis Date  . Arthritis   . Benign essential HTN 01/05/2018  . Blood transfusion    As an infant  . CKD (chronic kidney disease), stage III   . Dislocated IOL (intraocular lens), posterior 10/02/2011  . High cholesterol   . Hypertension   . Nonischemic cardiomyopathy ([place]) 04/19/2018  . Persistent atrial fibrillation ([place])   . Pneumonia ~ 1978    Patient Active Problem List   Diagnosis Date Noted  . Persistent atrial fibrillation ([place])   . Visit for monitoring Tikosyn therapy 04/19/2018  . Nonischemic cardiomyopathy ([place])  02/02/2018  . CKD (chronic kidney disease) stage 3, GFR 30-59 ml/min 02/02/2018  . Systolic HF (heart failure) ([place]) 01/20/2018  . Abnormal nuclear stress test 01/20/2018  . Abnormal stress test   . Paroxysmal atrial fibrillation ([place]) 01/05/2018  . Benign essential HTN 01/05/2018  . SOB (shortness of breath) 01/05/2018  . Dislocated IOL (intraocular lens), posterior 10/02/2011    Past Surgical History:  Procedure Laterality Date  . ANTERIOR CERVICAL DISCECTOMY  2008  . APPENDECTOMY     "as an infant"  . CARDIOVERSION N/A 03/11/2018   Procedure: CARDIOVERSION;  Surgeon: [name], MD;  Location: [facility] ENDOSCOPY;  Service: Cardiovascular;  Laterality: N/A;  . CARDIOVERSION N/A 04/21/2018   Procedure: CARDIOVERSION;  Surgeon: [name], MD;  Location: [place];  Service: Cardiovascular;  Laterality: N/A;  . CATARACT EXTRACTION W/ INTRAOCULAR LENS IMPLANT  08/13/11   left  . CATARACT EXTRACTION W/ INTRAOCULAR LENS IMPLANT  09/03/11   right  . Colonscopy    . Compound Fractures  06/1980   right arm; repaired with 2 plates and 10 pins.  . FRACTURE SURGERY    . GAS/FLUID EXCHANGE  10/20/2011   Procedure: GAS/FLUID EXCHANGE;  Surgeon: Hayden Pedro, MD;  Location: [place];  Service: Ophthalmology;  Laterality: Right;  . HERNIA REPAIR     "umbilical; as an infant"  . INTRAOCULAR LENS EXCHANGE  10/20/11   right eye  . PARS PLANA VITRECTOMY  10/20/2011  Procedure: PARS PLANA VITRECTOMY WITH 25G REMOVAL/SUTURE INTRAOCULAR LENS;  Surgeon: Hayden Pedro, MD;  Location: [place];  Service: Ophthalmology;  Laterality: Right;  . RIGHT/LEFT HEART CATH AND CORONARY ANGIOGRAPHY N/A 01/20/2018   Procedure: RIGHT/LEFT HEART CATH AND CORONARY ANGIOGRAPHY;  Surgeon: Belva Crome, MD;  Location: [place] CV LAB;  Service: Cardiovascular;  Laterality: N/A;       Family History  Problem Relation Age of Onset  . Lung cancer Mother   . Stomach cancer Mother   . Heart attack Father   .  Anesthesia problems Neg Hx     Social History   Tobacco Use  . Smoking status: Never Smoker  . Smokeless tobacco: Former Systems developer    Types: Chew  Substance Use Topics  . Alcohol use: No  . Drug use: No    Home Medications Prior to Admission medications   Medication Sig Start Date End Date Taking? Authorizing Provider  alclomethasone (ACLOVATE) 0.05 % cream Apply 1 application topically daily as needed (for rash). Forehead    [provider]  dofetilide (TIKOSYN) 250 MCG capsule TAKE 1 CAPSULE (250 MCG TOTAL) BY MOUTH 2 (TWO) TIMES DAILY. 06/23/19   Allred, Jeneen Rinks, MD  doxazosin (CARDURA) 8 MG tablet Take 8 mg by mouth at bedtime. 05/10/19   [provider]  ELIQUIS 5 MG TABS tablet TAKE 1 TABLET BY MOUTH TWICE A DAY 02/13/19   Allred, Jeneen Rinks, MD  fish oil-omega-3 fatty acids 1000 MG capsule Take 1 g by mouth every evening.     [provider]  furosemide (LASIX) 40 MG tablet TAKE 1 TABLET BY MOUTH EVERY DAY 07/28/19   Imogene Burn, PA-C  losartan (COZAAR) 100 MG tablet Take 1 tablet (100 mg total) by mouth daily. 08/24/18 02/09/19  Allred, Jeneen Rinks, MD  metoprolol succinate (TOPROL-XL) 25 MG 24 hr tablet Take 0.5 tablets (12.5 mg total) by mouth daily. 06/13/19   Sueanne Margarita, MD  Multiple Vitamin (MULITIVITAMIN WITH MINERALS) TABS Take 1 tablet by mouth every evening.     [provider]  pravastatin (PRAVACHOL) 80 MG tablet Take 80 mg by mouth at bedtime.     [provider]  RESTASIS 0.05 % ophthalmic emulsion Place 1 drop into both eyes 2 (two) times daily. 02/10/19   [provider]    Allergies    Honey bee treatment [bee venom], Wasp venom, and Neosporin [neomycin-bacitracin zn-polymyx]  Review of Systems   Review of Systems  Constitutional: Positive for activity change and fatigue. Negative for fever.  HENT: Negative for congestion and rhinorrhea.   Respiratory: Negative for cough, chest tightness and shortness of breath.     Cardiovascular: Negative for chest pain and leg swelling.  Gastrointestinal: Negative for abdominal pain, nausea and vomiting.  Genitourinary: Negative for dysuria and hematuria.  Musculoskeletal: Negative for arthralgias and myalgias.  Skin: Negative for rash.  Neurological: Positive for dizziness and light-headedness. Negative for weakness and headaches.   all other systems are negative except as noted in the HPI and PMH.    Physical Exam Updated Vital Signs BP 120/83   Resp 18   Ht 5\' 10"  (1.778 m)   Wt 99.8 kg   BMI 31.57 kg/m   Physical Exam Vitals and nursing note reviewed.  Constitutional:      General: He is not in acute distress.    Appearance: He is well-developed.  HENT:     Head: Normocephalic and atraumatic.     Mouth/Throat:  Pharynx: No oropharyngeal exudate.  Eyes:     Conjunctiva/sclera: Conjunctivae normal.     Pupils: Pupils are equal, round, and reactive to light.  Neck:     Comments: No meningismus. Cardiovascular:     Rate and Rhythm: Tachycardia present. Rhythm irregular.     Heart sounds: Normal heart sounds. No murmur.     Comments: Irregular rhythm, rate is 110s to 140s Pulmonary:     Effort: Pulmonary effort is normal. No respiratory distress.     Breath sounds: Normal breath sounds.  Abdominal:     Palpations: Abdomen is soft.     Tenderness: There is no abdominal tenderness. There is no guarding or rebound.  Musculoskeletal:        General: No tenderness. Normal range of motion.     Cervical back: Normal range of motion and neck supple.  Skin:    General: Skin is warm.     Capillary Refill: Capillary refill takes less than 2 seconds.  Neurological:     General: No focal deficit present.     Mental Status: He is alert and oriented to person, place, and time. Mental status is at baseline.     Cranial Nerves: No cranial nerve deficit.     Motor: No abnormal muscle tone.     Coordination: Coordination normal.     Comments: No ataxia  on finger to nose bilaterally. No pronator drift. 5/5 strength throughout. CN 2-12 intact.Equal grip strength. Sensation intact.   Psychiatric:        Behavior: Behavior normal.     ED Results / Procedures / Treatments   Labs (all labs ordered are listed, but only abnormal results are displayed) Labs Reviewed  COMPREHENSIVE METABOLIC PANEL - Abnormal; Notable for the following components:      Result Value   Glucose, Bld 132 (*)    Creatinine, Ser 1.58 (*)    Calcium 8.5 (*)    GFR calc non Af Amer 43 (*)    GFR calc Af Amer 50 (*)    All other components within normal limits  BRAIN NATRIURETIC PEPTIDE - Abnormal; Notable for the following components:   B Natriuretic Peptide 350.0 (*)    All other components within normal limits  CBC WITH DIFFERENTIAL/PLATELET  TROPONIN I (HIGH SENSITIVITY)  TROPONIN I (HIGH SENSITIVITY)    EKG EKG Interpretation  Date/Time:  Monday August 14 2019 14:45:15 EST Ventricular Rate:  135 PR Interval:    QRS Duration: 82 QT Interval:  321 QTC Calculation: 482 R Axis:   -3 Text Interpretation: Atrial fibrillation Borderline prolonged QT interval aFib with RVR Confirmed by Ezequiel Essex [phone]) on 08/14/2019 2:51:35 PM   Radiology DG Chest Portable 1 View  Result Date: 08/14/2019 CLINICAL DATA:  Tachycardia, atrial fibrillation, hypertension EXAM: PORTABLE CHEST 1 VIEW COMPARISON:  01/07/2018 FINDINGS: The heart size and mediastinal contours are within normal limits. Both lungs are clear. The visualized skeletal structures are unremarkable. IMPRESSION: No active disease. Electronically Signed   By: Randa Ngo M.D.   On: 08/14/2019 15:28    Procedures .Critical Care Performed by: Ezequiel Essex, MD Authorized by: Ezequiel Essex, MD   Critical care provider statement:    Critical care time (minutes):  35   Critical care was time spent personally by me on the following activities:  Discussions with consultants, evaluation of  patient's response to treatment, examination of patient, ordering and performing treatments and interventions, ordering and review of laboratory studies, ordering and review of radiographic studies,  pulse oximetry, re-evaluation of patient's condition, obtaining history from patient or surrogate and review of old charts   (including critical care time)  Medications Ordered in ED Medications  propofol (DIPRIVAN) 10 mg/mL bolus/IV push 49.9 mg (has no administration in time range)  sodium chloride 0.9 % bolus 500 mL (has no administration in time range)    ED Course  I have reviewed the triage vital signs and the nursing notes.  Pertinent labs & imaging results that were available during my care of the patient were reviewed by me and considered in my medical decision making (see chart for details).    MDM Rules/Calculators/A&P                     Patient with lightheadedness, found to be in atrial fibrillation with RVR.  Blood pressure and mental status are stable.  No chest pain or shortness of breath.  Denies any missed doses of Eliquis.  Plan for cardioversion patient agreeable.  Denies any missed doses of Eliquis.  Discussed with Dr. Bronson Ing cardiology who agrees.  Does not recommend any medication changes.  Patient did spontaneously convert to sinus rhythm before cardioversion was attempted.  Labs are Reassuring.  Patient remains in sinus rhythm and is asymptomatic.  Orthostatics are negative.  Troponin negative x2.  No hypotension in the ED.  Patient asymptomatic with negative orthostatics.  He has no chest pain or shortness of breath.  He has maintained sinus rhythm. He is tolerating p.o. and ambulatory Discussed continue his medications and call his cardiologist in the morning. Return precautions discussed  This patients CHA2DS2-VASc Score and unadjusted Ischemic Stroke Rate (% per year) is equal to3.  Above score calculated as 1 point each if present [CHF, HTN, DM,  Vascular=MI/PAD/Aortic Plaque, Age if 65-74, or Male] Above score calculated as 2 points each if present [Age > 75, or Stroke/TIA/TE]  Final Clinical Impression(s) / ED Diagnoses Final diagnoses:  Paroxysmal atrial fibrillation [facility])    Rx / [place] Orders ED Discharge Orders    None       Jarquez Mestre, Annie Main, MD 08/14/19 2311    Ezequiel Essex, MD 08/14/19 2312    Ezequiel Essex, MD 08/17/19 1015

## 2019-08-14 NOTE — ED Triage Notes (Signed)
Pt was in [place] he was lightheaded he went to [facility] in [place] found to have afib with rate in 110's to 130's pt sent here for further evaluation

## 2019-08-16 DIAGNOSIS — I4891 Unspecified atrial fibrillation: Secondary | ICD-10-CM | POA: Diagnosis not present

## 2019-08-17 ENCOUNTER — Ambulatory Visit (HOSPITAL_COMMUNITY): Payer: Medicare Other | Admitting: Nurse Practitioner

## 2019-08-17 ENCOUNTER — Telehealth: Payer: Self-pay | Admitting: *Deleted

## 2019-08-17 ENCOUNTER — Encounter (HOSPITAL_COMMUNITY): Payer: Self-pay | Admitting: Nurse Practitioner

## 2019-08-17 ENCOUNTER — Ambulatory Visit (HOSPITAL_COMMUNITY)
Admission: RE | Admit: 2019-08-17 | Discharge: 2019-08-17 | Disposition: A | Discharge status: Home / Self Care | Payer: Medicare Other | Source: Ambulatory Visit | Attending: Nurse Practitioner | Admitting: Nurse Practitioner

## 2019-08-17 ENCOUNTER — Other Ambulatory Visit: Payer: Self-pay

## 2019-08-17 VITALS — BP 180/90 | HR 58 | Ht 70.0 in | Wt 220.0 lb

## 2019-08-17 DIAGNOSIS — Z79899 Other long term (current) drug therapy: Secondary | ICD-10-CM | POA: Diagnosis not present

## 2019-08-17 DIAGNOSIS — N183 Chronic kidney disease, stage 3 unspecified: Secondary | ICD-10-CM | POA: Diagnosis not present

## 2019-08-17 DIAGNOSIS — Z87891 Personal history of nicotine dependence: Secondary | ICD-10-CM | POA: Insufficient documentation

## 2019-08-17 DIAGNOSIS — I130 Hypertensive heart and chronic kidney disease with heart failure and stage 1 through stage 4 chronic kidney disease, or unspecified chronic kidney disease: Secondary | ICD-10-CM | POA: Diagnosis not present

## 2019-08-17 DIAGNOSIS — D6869 Other thrombophilia: Secondary | ICD-10-CM

## 2019-08-17 DIAGNOSIS — M1990 Unspecified osteoarthritis, unspecified site: Secondary | ICD-10-CM | POA: Insufficient documentation

## 2019-08-17 DIAGNOSIS — I700 Atherosclerosis of aorta: Secondary | ICD-10-CM | POA: Diagnosis not present

## 2019-08-17 DIAGNOSIS — I428 Other cardiomyopathies: Secondary | ICD-10-CM | POA: Insufficient documentation

## 2019-08-17 DIAGNOSIS — E7800 Pure hypercholesterolemia, unspecified: Secondary | ICD-10-CM | POA: Insufficient documentation

## 2019-08-17 DIAGNOSIS — Z7901 Long term (current) use of anticoagulants: Secondary | ICD-10-CM | POA: Diagnosis not present

## 2019-08-17 DIAGNOSIS — I4819 Other persistent atrial fibrillation: Secondary | ICD-10-CM | POA: Insufficient documentation

## 2019-08-17 LAB — MAGNESIUM: Magnesium: 2.2 mg/dL (ref 1.7–2.4)

## 2019-08-17 NOTE — Progress Notes (Addendum)
Primary Care Physician: Hulan Fess, MD Referring Physician: Dr. Lorin Picket is a 74 y.o. male with a h/o afib on Tikosyn here for f/u ER visit for afib associated with hypotension and lightheadedness. It came on in the middle of the night.  He can not think of any triggers other than he has been told he snores.  He will often catch him self waking him self up trying to catch his breath. He is being compliant with meds.  He first went to urgent care and then to the ER. He was concerned with the systolic BP at home in the 70's. In the ER, it was 120/80 with a HR of 135 bpm in afib. Right before cardioversion he went back into SR. He has remained in SR. He continues on tikosyn 250 mcg bid with eliquis 5 mg bid with CHA2DS2VASc score of 2.   Today, he denies symptoms of palpitations, chest pain, shortness of breath, orthopnea, PND, lower extremity edema, dizziness, presyncope, syncope, or neurologic sequela. The patient is tolerating medications without difficulties and is otherwise without complaint today.   Past Medical History:  Diagnosis Date  . Arthritis   . Benign essential HTN 01/05/2018  . Blood transfusion    As an infant  . CKD (chronic kidney disease), stage III   . Dislocated IOL (intraocular lens), posterior 10/02/2011  . High cholesterol   . Hypertension   . Nonischemic cardiomyopathy ([place]) 04/19/2018  . Persistent atrial fibrillation ([place])   . Pneumonia ~ 1978   Past Surgical History:  Procedure Laterality Date  . ANTERIOR CERVICAL DISCECTOMY  2008  . APPENDECTOMY     "as an infant"  . CARDIOVERSION N/A 03/11/2018   Procedure: CARDIOVERSION;  Surgeon: [name], MD;  Location: [facility] ENDOSCOPY;  Service: Cardiovascular;  Laterality: N/A;  . CARDIOVERSION N/A 04/21/2018   Procedure: CARDIOVERSION;  Surgeon: [name], MD;  Location: [place];  Service: Cardiovascular;  Laterality: N/A;  . CATARACT EXTRACTION W/ INTRAOCULAR LENS IMPLANT   08/13/11   left  . CATARACT EXTRACTION W/ INTRAOCULAR LENS IMPLANT  09/03/11   right  . Colonscopy    . Compound Fractures  06/1980   right arm; repaired with 2 plates and 10 pins.  . FRACTURE SURGERY    . GAS/FLUID EXCHANGE  10/20/2011   Procedure: GAS/FLUID EXCHANGE;  Surgeon: Hayden Pedro, MD;  Location: [place];  Service: Ophthalmology;  Laterality: Right;  . HERNIA REPAIR     "umbilical; as an infant"  . INTRAOCULAR LENS EXCHANGE  10/20/11   right eye  . PARS PLANA VITRECTOMY  10/20/2011   Procedure: PARS PLANA VITRECTOMY WITH 25G REMOVAL/SUTURE INTRAOCULAR LENS;  Surgeon: Hayden Pedro, MD;  Location: [place];  Service: Ophthalmology;  Laterality: Right;  . RIGHT/LEFT HEART CATH AND CORONARY ANGIOGRAPHY N/A 01/20/2018   Procedure: RIGHT/LEFT HEART CATH AND CORONARY ANGIOGRAPHY;  Surgeon: Belva Crome, MD;  Location: [place] CV LAB;  Service: Cardiovascular;  Laterality: N/A;    Current Outpatient Medications  Medication Sig Dispense Refill  . alclomethasone (ACLOVATE) 0.05 % cream Apply 1 application topically daily as needed (for rash). Forehead    . dofetilide (TIKOSYN) 250 MCG capsule TAKE 1 CAPSULE (250 MCG TOTAL) BY MOUTH 2 (TWO) TIMES DAILY. 180 capsule 3  . doxazosin (CARDURA) 8 MG tablet Take 8 mg by mouth every evening.     Marland Kitchen ELIQUIS 5 MG TABS tablet TAKE 1 TABLET BY MOUTH TWICE A DAY (Patient taking differently:  Take 5 mg by mouth 2 (two) times daily. ) 180 tablet 1  . fish oil-omega-3 fatty acids 1000 MG capsule Take 1 g by mouth every evening.     . furosemide (LASIX) 40 MG tablet TAKE 1 TABLET BY MOUTH EVERY DAY (Patient taking differently: Take 40 mg by mouth every morning. ) 90 tablet 1  . losartan (COZAAR) 100 MG tablet Take 1 tablet (100 mg total) by mouth daily. (Patient taking differently: Take 100 mg by mouth every morning. ) 90 tablet 3  . metoprolol succinate (TOPROL-XL) 25 MG 24 hr tablet Take 0.5 tablets (12.5 mg total) by mouth daily. 45 tablet 3  . Multiple  Vitamin (MULITIVITAMIN WITH MINERALS) TABS Take 1 tablet by mouth every evening.     . pravastatin (PRAVACHOL) 80 MG tablet Take 80 mg by mouth at bedtime.     . RESTASIS 0.05 % ophthalmic emulsion Place 1 drop into both eyes 2 (two) times daily.     No current facility-administered medications for this encounter.    Allergies  Allergen Reactions  . Honey Bee Treatment [Bee Venom] Anaphylaxis, Hives and Swelling  . Wasp Venom Anaphylaxis, Hives and Swelling  . Neosporin [Neomycin-Bacitracin Zn-Polymyx] Rash    Social History   Socioeconomic History  . Marital status: Married    Spouse name: Not on file  . Number of children: Not on file  . Years of education: Not on file  . Highest education level: Not on file  Occupational History  . Not on file  Tobacco Use  . Smoking status: Never Smoker  . Smokeless tobacco: Former Systems developer    Types: Chew  Substance and Sexual Activity  . Alcohol use: No  . Drug use: No  . Sexual activity: Not Currently  Other Topics Concern  . Not on file  Social History Narrative  . Not on file   Social Determinants of Health   Financial Resource Strain:   . Difficulty of Paying Living Expenses: Not on file  Food Insecurity:   . Worried About Charity fundraiser in the Last Year: Not on file  . Ran Out of Food in the Last Year: Not on file  Transportation Needs:   . Lack of Transportation (Medical): Not on file  . Lack of Transportation (Non-Medical): Not on file  Physical Activity:   . Days of Exercise per Week: Not on file  . Minutes of Exercise per Session: Not on file  Stress:   . Feeling of Stress : Not on file  Social Connections:   . Frequency of Communication with Friends and Family: Not on file  . Frequency of Social Gatherings with Friends and Family: Not on file  . Attends Religious Services: Not on file  . Active Member of Clubs or Organizations: Not on file  . Attends Archivist Meetings: Not on file  . Marital  Status: Not on file  Intimate Partner Violence:   . Fear of Current or Ex-Partner: Not on file  . Emotionally Abused: Not on file  . Physically Abused: Not on file  . Sexually Abused: Not on file    Family History  Problem Relation Age of Onset  . Lung cancer Mother   . Stomach cancer Mother   . Heart attack Father   . Anesthesia problems Neg Hx     ROS- All systems are reviewed and negative except as per the HPI above  Physical Exam: Vitals:   08/17/19 0911  BP: (!) 180/90  Pulse: (!) 58  Weight: 99.8 kg  Height: 5\' 10"  (1.778 m)   Wt Readings from Last 3 Encounters:  08/17/19 99.8 kg  08/14/19 99.8 kg  05/31/19 95.7 kg    Labs: Lab Results  Component Value Date   NA 141 08/14/2019   K 4.4 08/14/2019   CL 106 08/14/2019   CO2 27 08/14/2019   GLUCOSE 132 (H) 08/14/2019   BUN 23 08/14/2019   CREATININE 1.58 (H) 08/14/2019   CALCIUM 8.5 (L) 08/14/2019   MG 2.0 02/09/2019   No results found for: INR No results found for: CHOL, HDL, LDLCALC, TRIG   GEN- The patient is well appearing, alert and oriented x 3 today.   Head- normocephalic, atraumatic Eyes-  Sclera clear, conjunctiva pink Ears- hearing intact Oropharynx- clear Neck- supple, no JVP Lymph- no cervical lymphadenopathy Lungs- Clear to ausculation bilaterally, normal work of breathing Heart- Regular rate and rhythm, no murmurs, rubs or gallops, PMI not laterally displaced GI- soft, NT, ND, + BS Extremities- no clubbing, cyanosis, or edema MS- no significant deformity or atrophy Skin- no rash or lesion Psych- euthymic mood, full affect Neuro- strength and sensation are intact  EKG-Sinus brady at 58 bpm, PR int 156 ms, qrs int 76 ms, qtc 420 ms Epic records reviewed Echo-2/20-IMPRESSIONS    1. The left ventricle has normal systolic function, with an ejection fraction of 55-60%. The cavity size was normal. There is mild concentric left ventricular hypertrophy. Left ventricular diastolic Doppler  parameters are consistent with impaired  relaxation.  2. The right ventricle has normal systolic function. The cavity was normal. There is no increase in right ventricular wall thickness.  3. The mitral valve is normal in structure.  4. The tricuspid valve is normal in structure.  5. The aortic valve is tricuspid There is mild thickening and mild calcification of the aortic valve. Aortic valve regurgitation is trivial by color flow Doppler.  6. The pulmonic valve was normal in structure.  7. Right atrial pressure is estimated at 3 mmHg.  FINDINGS  Left Ventricle: The left ventricle has normal systolic function, with an ejection fraction of 55-60%. The cavity size was normal. There is mild concentric left ventricular hypertrophy. Left ventricular diastolic Doppler parameters are consistent with  impaired relaxation Right Ventricle: The right ventricle has normal systolic function. The cavity was normal. There is no increase in right ventricular wall thickness. Left Atrium: left atrial size was normal in size Right Atrium: right atrial size was normal in size Right atrial pressure is estimated at 3 mmHg. Interatrial Septum: No atrial level shunt detected by color flow Doppler. Pericardium: There is no evidence of pericardial effusion. Mitral Valve: The mitral valve is normal in structure. Mitral valve regurgitation is trivial by color flow Doppler. Tricuspid Valve: The tricuspid valve is normal in structure. Tricuspid valve regurgitation was not visualized by color flow Doppler. Aortic Valve: The aortic valve is tricuspid There is mild thickening and mild calcification of the aortic valve. Aortic valve regurgitation is trivial by color flow Doppler. Pulmonic Valve: The pulmonic valve was normal in structure. Pulmonic valve regurgitation is not visualized by color flow Doppler. Venous: The inferior vena cava measures 1.10 cm, is normal in size with greater than 50% respiratory variability.      Assessment and Plan: 1. Persistent  afib Has been doing very well maintaining SR on tikosyn until a recent breakthrough with pt self converting in ER  Continue dofetilide 250 mcg bid Continue metoprolol 12.5 mg daily  Bmet in ER with K+ of 4.4, mag to be drawn today Will refer for sleep study for snoring, waking up gasping for air   2. CHA2DS2VASc score of 3 Continue apixaban 5 mg bid    3. HTN  Elevated on arrival Rechecked at 144/80  4. CHA2DS2VASc score of at least 2 Continue eliquis 5 mg bid   F/u in 3 months with Dr. Lawrence Marseilles C. Gennell How, [facility] [address] [place], [place] [address] [phone]

## 2019-08-17 NOTE — Telephone Encounter (Signed)
Staff message sent to Gae Bon ok to schedule sleep study. No PA is required. Patient has medicare.

## 2019-08-25 ENCOUNTER — Other Ambulatory Visit (HOSPITAL_BASED_OUTPATIENT_CLINIC_OR_DEPARTMENT_OTHER): Payer: Self-pay

## 2019-08-29 DIAGNOSIS — L570 Actinic keratosis: Secondary | ICD-10-CM | POA: Diagnosis not present

## 2019-08-29 DIAGNOSIS — Z23 Encounter for immunization: Secondary | ICD-10-CM | POA: Diagnosis not present

## 2019-08-29 DIAGNOSIS — D225 Melanocytic nevi of trunk: Secondary | ICD-10-CM | POA: Diagnosis not present

## 2019-08-29 DIAGNOSIS — C44612 Basal cell carcinoma of skin of right upper limb, including shoulder: Secondary | ICD-10-CM | POA: Diagnosis not present

## 2019-08-29 DIAGNOSIS — L700 Acne vulgaris: Secondary | ICD-10-CM | POA: Diagnosis not present

## 2019-08-29 DIAGNOSIS — L578 Other skin changes due to chronic exposure to nonionizing radiation: Secondary | ICD-10-CM | POA: Diagnosis not present

## 2019-08-29 DIAGNOSIS — L814 Other melanin hyperpigmentation: Secondary | ICD-10-CM | POA: Diagnosis not present

## 2019-08-29 DIAGNOSIS — L219 Seborrheic dermatitis, unspecified: Secondary | ICD-10-CM | POA: Diagnosis not present

## 2019-08-29 DIAGNOSIS — D485 Neoplasm of uncertain behavior of skin: Secondary | ICD-10-CM | POA: Diagnosis not present

## 2019-08-29 DIAGNOSIS — L821 Other seborrheic keratosis: Secondary | ICD-10-CM | POA: Diagnosis not present

## 2019-08-30 DIAGNOSIS — T63451D Toxic effect of venom of hornets, accidental (unintentional), subsequent encounter: Secondary | ICD-10-CM | POA: Diagnosis not present

## 2019-08-30 DIAGNOSIS — T63461D Toxic effect of venom of wasps, accidental (unintentional), subsequent encounter: Secondary | ICD-10-CM | POA: Diagnosis not present

## 2019-08-30 DIAGNOSIS — T63441D Toxic effect of venom of bees, accidental (unintentional), subsequent encounter: Secondary | ICD-10-CM | POA: Diagnosis not present

## 2019-09-04 ENCOUNTER — Other Ambulatory Visit: Payer: Self-pay | Admitting: Internal Medicine

## 2019-09-04 ENCOUNTER — Telehealth: Payer: Self-pay | Admitting: *Deleted

## 2019-09-04 ENCOUNTER — Other Ambulatory Visit (HOSPITAL_COMMUNITY): Payer: Medicare Other

## 2019-09-04 NOTE — Telephone Encounter (Addendum)
Patient is scheduled for lab study on 10/25/19. Pt is scheduled for COVID screening on 10/23/19 11 am prior to SS. Patient understands his sleep study will be done at [facility]. Patient understands he will receive a sleep packet in a week or so. Patient understands to call if he does not receive the sleep packet in a timely manner.  Left detailed message on voicemail with date and time of titration and informed patient to call back to confirm or reschedule.

## 2019-09-04 NOTE — Telephone Encounter (Signed)
-----   Message from Lauralee Evener, [place] sent at 08/17/2019 10:39 AM EST ----- Regarding: RE: sleep study Ok to schedule sleep study. No PA is required. Patient has Medicare. ----- Message ----- From: Juluis Mire, RN Sent: 08/17/2019   9:35 AM EST To: Cv Div Sleep Studies Subject: sleep study                                    Pt needs sleep study for afib, snoring per donna carroll orders in epic. Thank you [facility]

## 2019-09-04 NOTE — Telephone Encounter (Signed)
Eliquis 5mg  refill request received, pt is 74yrs old, weight-99.8kg, Crea-1.58 on 08/14/2019, Diagnosis-Afib, and last seen by Roderic Palau on 08/17/2019. Dose is appropriate based on dosing criteria. Will send in refill to requested pharmacy.

## 2019-09-06 DIAGNOSIS — I10 Essential (primary) hypertension: Secondary | ICD-10-CM | POA: Diagnosis not present

## 2019-09-06 DIAGNOSIS — N400 Enlarged prostate without lower urinary tract symptoms: Secondary | ICD-10-CM | POA: Diagnosis not present

## 2019-09-06 DIAGNOSIS — E782 Mixed hyperlipidemia: Secondary | ICD-10-CM | POA: Diagnosis not present

## 2019-09-06 DIAGNOSIS — N189 Chronic kidney disease, unspecified: Secondary | ICD-10-CM | POA: Diagnosis not present

## 2019-09-06 DIAGNOSIS — I4891 Unspecified atrial fibrillation: Secondary | ICD-10-CM | POA: Diagnosis not present

## 2019-09-09 DIAGNOSIS — R300 Dysuria: Secondary | ICD-10-CM | POA: Diagnosis not present

## 2019-09-12 DIAGNOSIS — C44612 Basal cell carcinoma of skin of right upper limb, including shoulder: Secondary | ICD-10-CM | POA: Diagnosis not present

## 2019-10-02 DIAGNOSIS — T63451D Toxic effect of venom of hornets, accidental (unintentional), subsequent encounter: Secondary | ICD-10-CM | POA: Diagnosis not present

## 2019-10-02 DIAGNOSIS — T63441D Toxic effect of venom of bees, accidental (unintentional), subsequent encounter: Secondary | ICD-10-CM | POA: Diagnosis not present

## 2019-10-02 DIAGNOSIS — T63461D Toxic effect of venom of wasps, accidental (unintentional), subsequent encounter: Secondary | ICD-10-CM | POA: Diagnosis not present

## 2019-10-23 ENCOUNTER — Other Ambulatory Visit: Payer: Self-pay

## 2019-10-23 ENCOUNTER — Other Ambulatory Visit (HOSPITAL_COMMUNITY)
Admission: RE | Admit: 2019-10-23 | Discharge: 2019-10-23 | Disposition: A | Discharge status: Home / Self Care | Payer: Medicare Other | Source: Ambulatory Visit | Attending: Cardiology | Admitting: Cardiology

## 2019-10-23 DIAGNOSIS — Z20822 Contact with and (suspected) exposure to covid-19: Secondary | ICD-10-CM | POA: Insufficient documentation

## 2019-10-23 DIAGNOSIS — Z01812 Encounter for preprocedural laboratory examination: Secondary | ICD-10-CM | POA: Insufficient documentation

## 2019-10-23 LAB — SARS CORONAVIRUS 2 (TAT 6-24 HRS): SARS Coronavirus 2: NEGATIVE

## 2019-10-25 ENCOUNTER — Other Ambulatory Visit: Payer: Self-pay

## 2019-10-25 ENCOUNTER — Ambulatory Visit: Payer: Medicare Other | Attending: Nurse Practitioner | Admitting: Cardiology

## 2019-10-25 DIAGNOSIS — I4819 Other persistent atrial fibrillation: Secondary | ICD-10-CM

## 2019-10-25 DIAGNOSIS — G4731 Primary central sleep apnea: Secondary | ICD-10-CM | POA: Diagnosis not present

## 2019-10-25 DIAGNOSIS — R0902 Hypoxemia: Secondary | ICD-10-CM | POA: Insufficient documentation

## 2019-10-25 DIAGNOSIS — G4733 Obstructive sleep apnea (adult) (pediatric): Secondary | ICD-10-CM | POA: Diagnosis not present

## 2019-10-30 DIAGNOSIS — T63441D Toxic effect of venom of bees, accidental (unintentional), subsequent encounter: Secondary | ICD-10-CM | POA: Diagnosis not present

## 2019-10-30 DIAGNOSIS — T63451D Toxic effect of venom of hornets, accidental (unintentional), subsequent encounter: Secondary | ICD-10-CM | POA: Diagnosis not present

## 2019-10-30 DIAGNOSIS — T63461D Toxic effect of venom of wasps, accidental (unintentional), subsequent encounter: Secondary | ICD-10-CM | POA: Diagnosis not present

## 2019-11-01 IMAGING — CR DG CHEST 2V
2 series · 2 of 2 positions shown · non-contrast
Comparison: 10/20/2011

CLINICAL DATA: Cough and shortness of breath for 1 month

EXAM:
CHEST - 2 VIEW

[w chest pa]
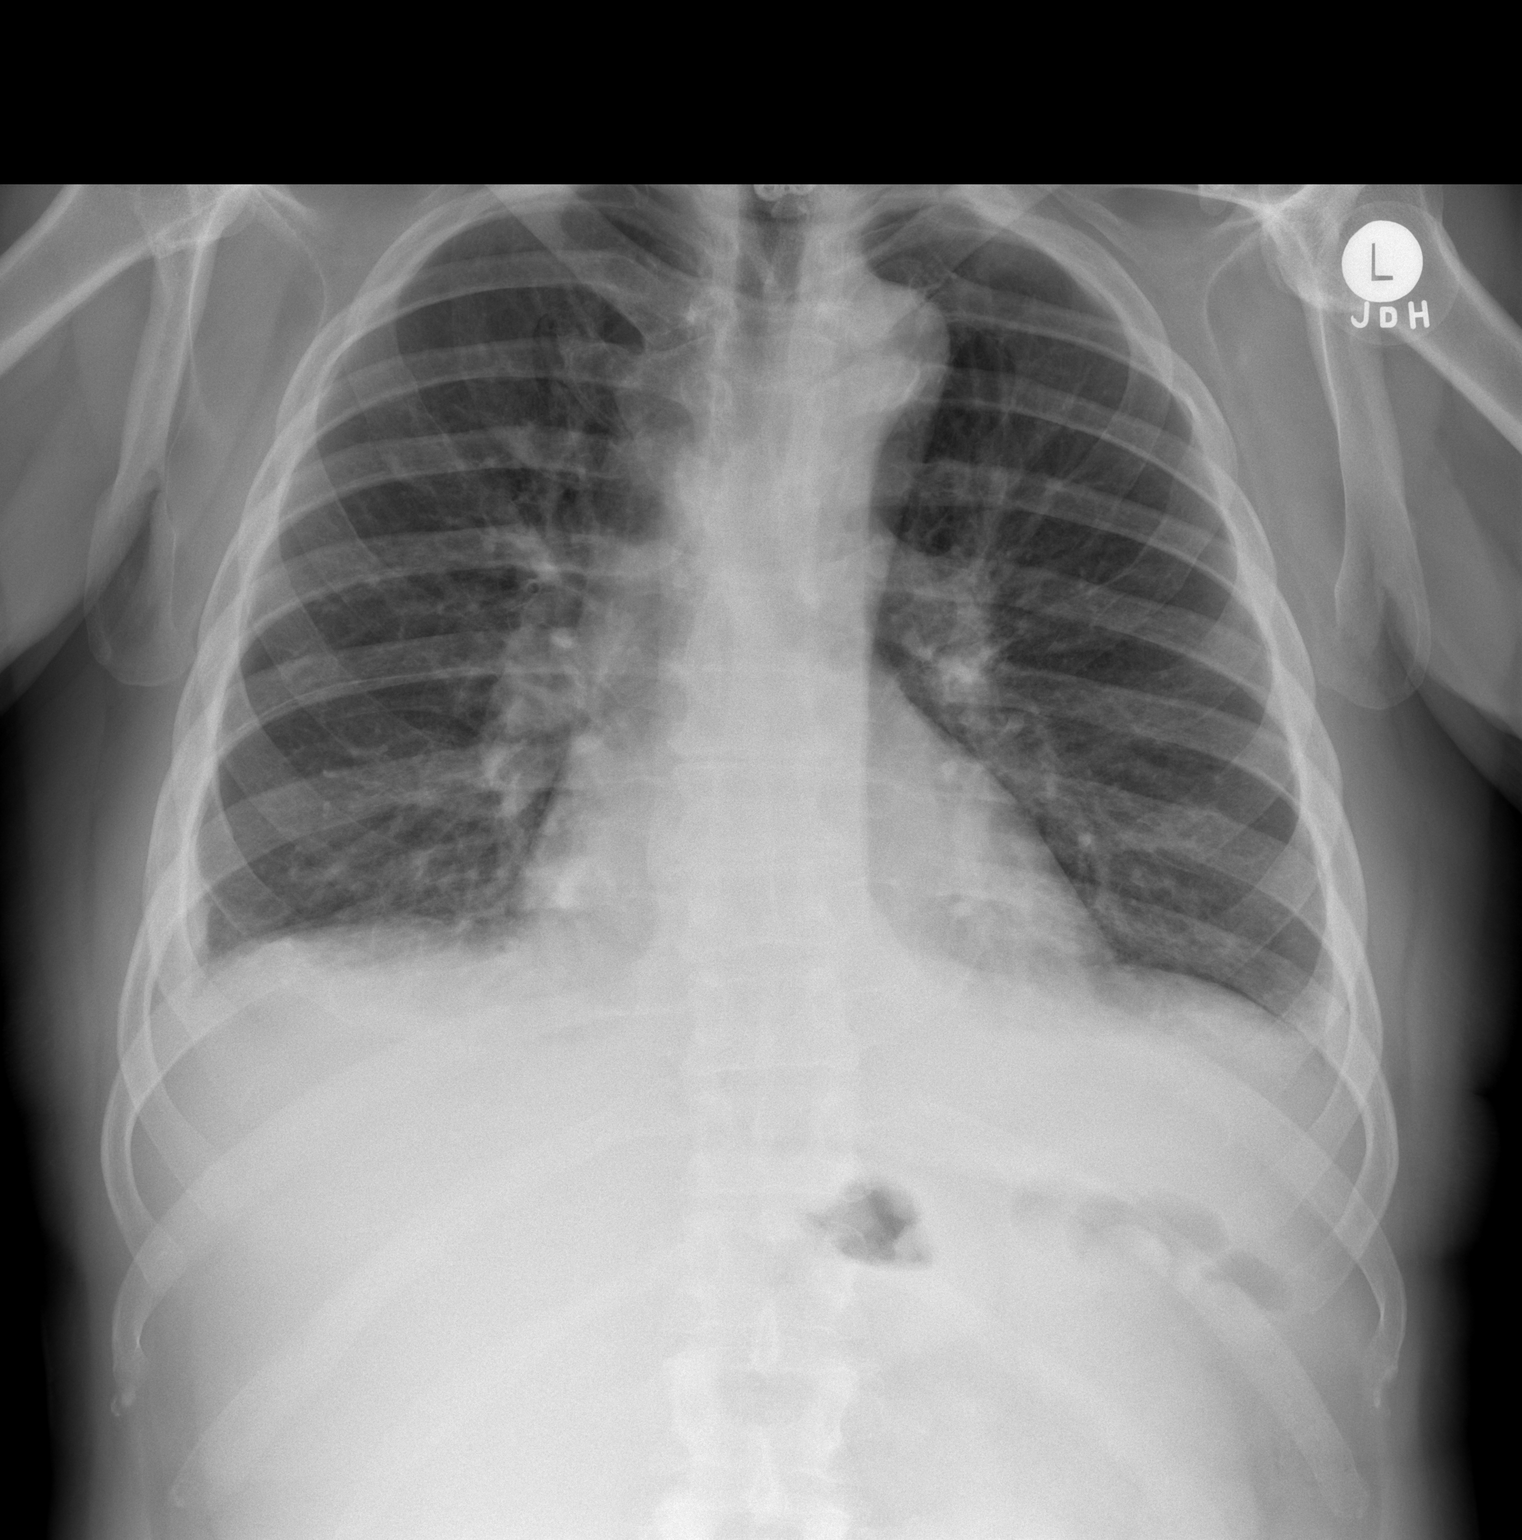

[w chest lat]
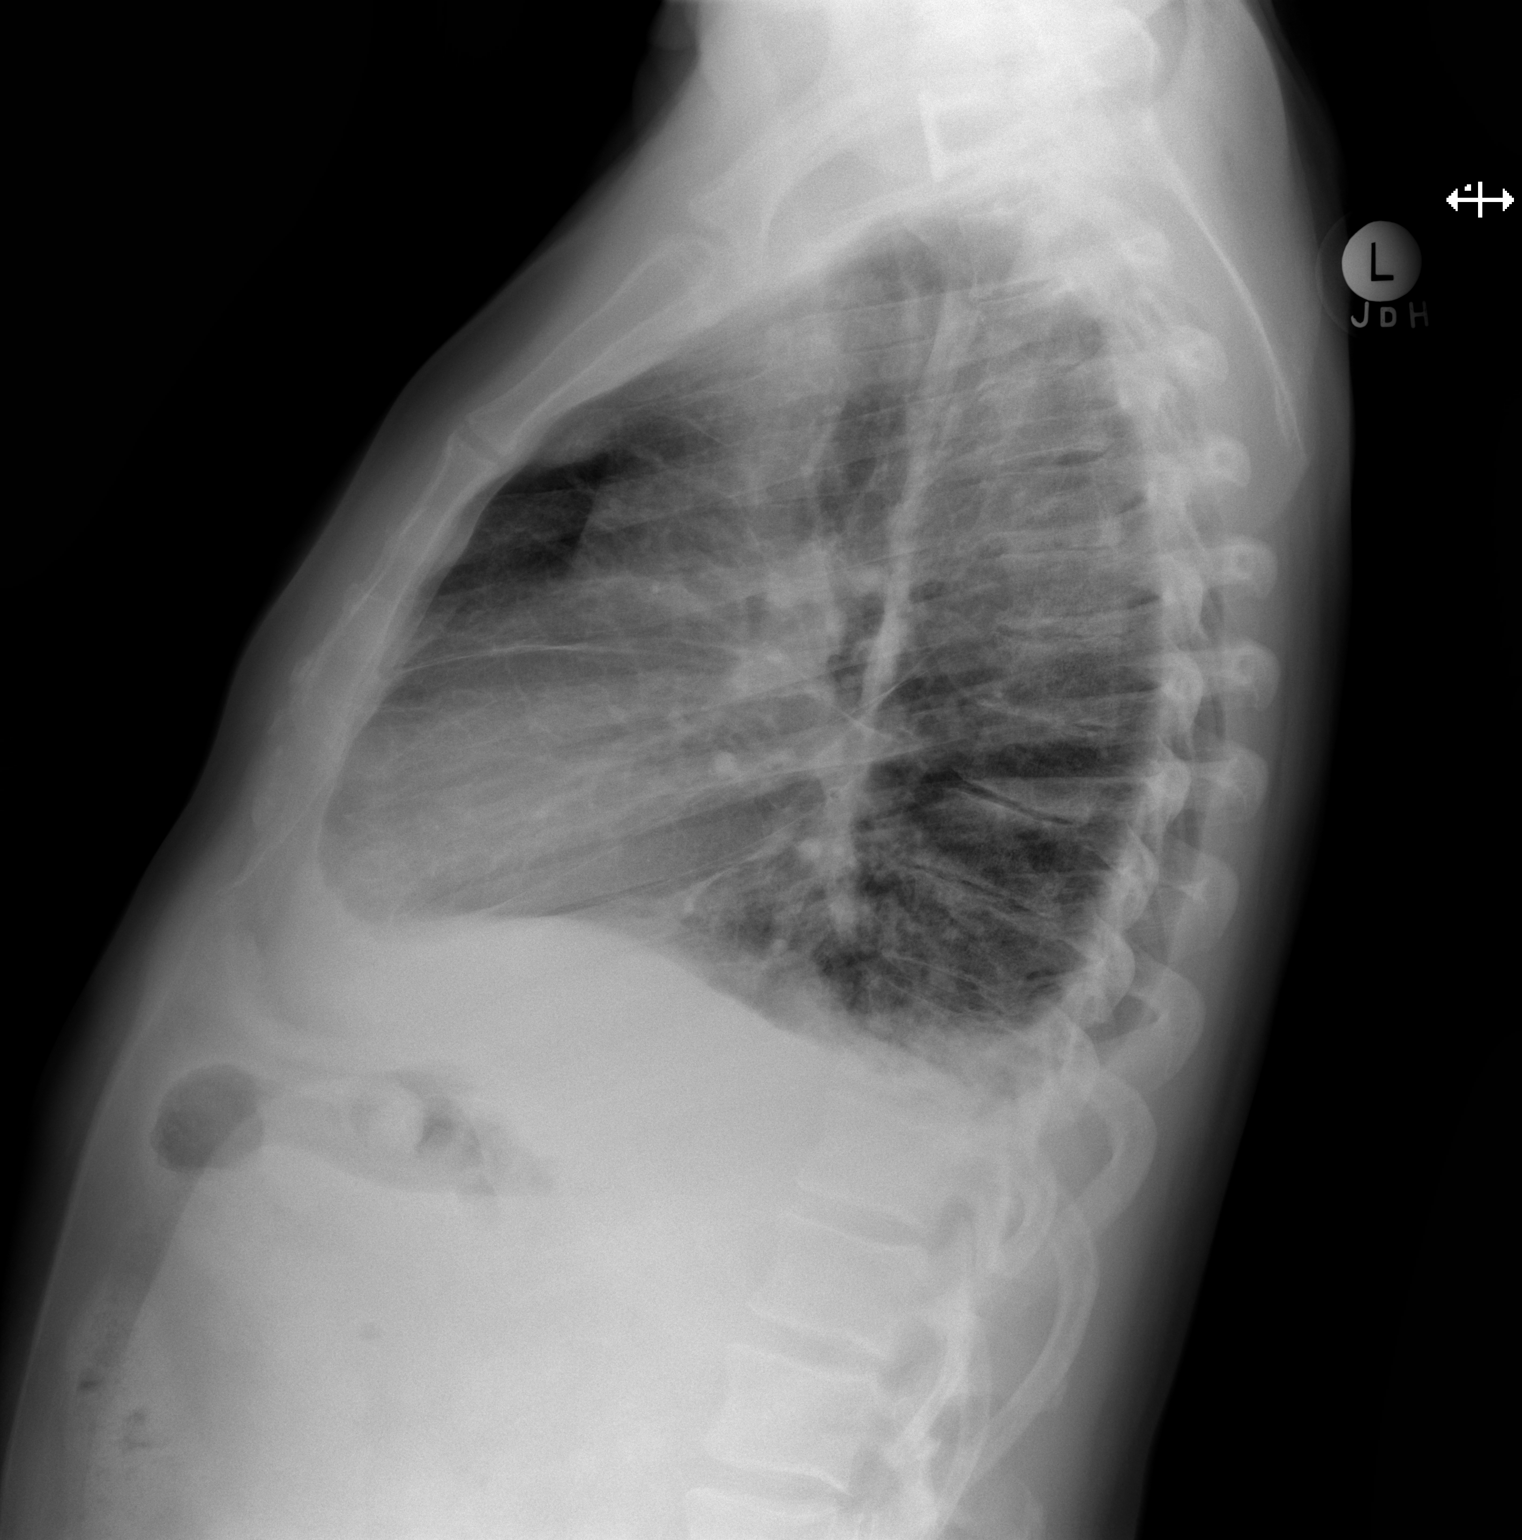

[2 of 2 positions shown; findings below may reference images not displayed]

FINDINGS: Cardiac shadow is stable. Lungs are well aerated bilaterally. Small
bilateral pleural effusions are seen with mild bibasilar atelectatic
changes. No focal confluent infiltrate is seen. Postsurgical changes
in the cervical spine are noted.
IMPRESSION: Mild bibasilar atelectatic changes with small effusions.

## 2019-11-14 NOTE — Procedures (Signed)
   Patient Name: John Cobb, John Cobb Date: 10/25/2019 Gender: Male D.O.B: 04/05/1946 Age (years): 73 Referring Provider: Sherran Needs Height (inches): 72 Interpreting Physician: Fransico Him MD, ABSM Weight (lbs): 220 RPSGT: Peak, Robert BMI: 30 MRN: [id number] Neck Size: 16.00  CLINICAL INFORMATION Sleep Study Type: NPSG  Indication for sleep study: N/A  Epworth Sleepiness Score: 2  SLEEP STUDY TECHNIQUE As per the AASM Manual for the Scoring of Sleep and Associated Events v2.3 (April 2016) with a hypopnea requiring 4% desaturations.  The channels recorded and monitored were frontal, central and occipital EEG, electrooculogram (EOG), submentalis EMG (chin), nasal and oral airflow, thoracic and abdominal wall motion, anterior tibialis EMG, snore microphone, electrocardiogram, and pulse oximetry.  MEDICATIONS Medications self-administered by patient taken the night of the study : N/A  SLEEP ARCHITECTURE The study was initiated at 10:42:09 PM and ended at 4:56:43 AM.  Sleep onset time was 32.4 minutes and the sleep efficiency was 57.1%. The total sleep time was 214 minutes.  Stage REM latency was 238.0 minutes.  The patient spent 3.3% of the night in stage N1 sleep, 79.9% in stage N2 sleep, 0.7% in stage N3 and 16.1% in REM.  Alpha intrusion was absent.  Supine sleep was 42.76%.  RESPIRATORY PARAMETERS The overall apnea/hypopnea index (AHI) was 41.5 per hour. There were 42 total apneas, including 12 obstructive, 30 central and 0 mixed apneas. There were 106 hypopneas and 5 RERAs.  The AHI during Stage REM sleep was 57.4 per hour.  AHI while supine was 40.7 per hour.  The mean oxygen saturation was 93.4%. The minimum SpO2 during sleep was 83.0%.  moderate snoring was noted during this study.  CARDIAC DATA The 2 lead EKG demonstrated sinus rhythm. The mean heart rate was 51.7 beats per minute. Other EKG findings include: PACs.  LEG MOVEMENT DATA  The total  PLMS were 0 with a resulting PLMS index of 0.0. Associated arousal with leg movement index was 0.0 .  IMPRESSIONS - Severe obstructive sleep apnea occurred during this study (AHI = 41.5/h). - Mild central sleep apnea occurred during this study (CAI = 8.4/h). - Mild oxygen desaturation was noted during this study (Min O2 = 83.0%). - The patient snored with moderate snoring volume. - No cardiac abnormalities were noted during this study. - Clinically significant periodic limb movements did not occur during sleep. No significant associated arousals.  DIAGNOSIS - Obstructive Sleep Apnea (327.23 [G47.33 ICD-10]) - Central Sleep Apnea (327.27 [G47.37 ICD-10]) - Nocturnal Hypoxemia (327.26 [G47.36 ICD-10])  RECOMMENDATIONS - CPAP titration to determine optimal pressure required to alleviate sleep disordered breathing. BiPAP or ASV titration may be required to eliminate central sleep apnea. - Avoid alcohol, sedatives and other CNS depressants that may worsen sleep apnea and disrupt normal sleep architecture. - Sleep hygiene should be reviewed to assess factors that may improve sleep quality. - Weight management and regular exercise should be initiated or continued if appropriate.  [Electronically signed] 11/14/2019 08:58 PM  Fransico Him MD, ABSM Diplomate, American Board of Sleep Medicine

## 2019-11-16 DIAGNOSIS — H168 Other keratitis: Secondary | ICD-10-CM | POA: Diagnosis not present

## 2019-11-17 ENCOUNTER — Telehealth: Payer: Self-pay | Admitting: *Deleted

## 2019-11-17 DIAGNOSIS — G4733 Obstructive sleep apnea (adult) (pediatric): Secondary | ICD-10-CM

## 2019-11-17 NOTE — Telephone Encounter (Signed)
-----   Message from Sueanne Margarita, MD sent at 11/14/2019  9:00 PM EDT ----- Please let patient know that they have sleep apnea and recommend CPAP titration. Please set up titration in the sleep lab.

## 2019-11-20 ENCOUNTER — Telehealth (INDEPENDENT_AMBULATORY_CARE_PROVIDER_SITE_OTHER): Payer: Medicare Other | Admitting: Internal Medicine

## 2019-11-20 ENCOUNTER — Other Ambulatory Visit: Payer: Self-pay

## 2019-11-20 VITALS — BP 140/71 | HR 58

## 2019-11-20 DIAGNOSIS — G4733 Obstructive sleep apnea (adult) (pediatric): Secondary | ICD-10-CM

## 2019-11-20 DIAGNOSIS — I480 Paroxysmal atrial fibrillation: Secondary | ICD-10-CM | POA: Diagnosis not present

## 2019-11-20 DIAGNOSIS — I428 Other cardiomyopathies: Secondary | ICD-10-CM

## 2019-11-20 DIAGNOSIS — I10 Essential (primary) hypertension: Secondary | ICD-10-CM | POA: Diagnosis not present

## 2019-11-20 NOTE — Telephone Encounter (Signed)
Titration sent to sleep pool.

## 2019-11-20 NOTE — Progress Notes (Signed)
Electrophysiology TeleHealth Note  Due to national recommendations of social distancing due to [place], an audio telehealth visit is felt to be most appropriate for this patient at this time.  Verbal consent was obtained by me for the telehealth visit today.  The patient does not have capability for a virtual visit.  A phone visit is therefore required today.   Date:  11/20/2019   ID:  OTHON PANDOLFO, DOB Feb 09, 1946, MRN [id number]  Location: patient's home  Provider location:  [place]  Evaluation Performed: Follow-up visit  PCP:  Hulan Fess, MD   Electrophysiologist:  Dr Rayann Heman  Chief Complaint:  AF follow up  History of Present Illness:    CLEVESTER MADRIGAL is a 74 y.o. male who presents via telehealth conferencing today.  Since last being seen in our clinic, the patient reports doing reasonably well.   He had one episode of AF for which he was seen in the ER.  He self converted prior to cardioversion. He hasn't had any other episodes of AF.  Today, he denies symptoms of palpitations, chest pain, shortness of breath,  lower extremity edema, dizziness, presyncope, or syncope.  The patient is otherwise without complaint today.     Past Medical History:  Diagnosis Date  . Arthritis   . Benign essential HTN 01/05/2018  . Blood transfusion    As an infant  . CKD (chronic kidney disease), stage III   . Dislocated IOL (intraocular lens), posterior 10/02/2011  . High cholesterol   . Hypertension   . Nonischemic cardiomyopathy ([place]) 04/19/2018  . Persistent atrial fibrillation ([place])   . Pneumonia ~ 1978    Past Surgical History:  Procedure Laterality Date  . ANTERIOR CERVICAL DISCECTOMY  2008  . APPENDECTOMY     "as an infant"  . CARDIOVERSION N/A 03/11/2018   Procedure: CARDIOVERSION;  Surgeon: [name], MD;  Location[facility];  Service: Cardiovascular;  Laterality: N/A;  . CARDIOVERSION N/A 04/21/2018   Procedure: CARDIOVERSION;  Surgeon: [name], MD;  Location: [facility];  Service: Cardiovascular;  Laterality: N/A;  . CATARACT EXTRACTION W/ INTRAOCULAR LENS IMPLANT  08/13/11   left  . CATARACT EXTRACTION W/ INTRAOCULAR LENS IMPLANT  09/03/11   right  . Colonscopy    . Compound Fractures  06/1980   right arm; repaired with 2 plates and 10 pins.  . FRACTURE SURGERY    . GAS/FLUID EXCHANGE  10/20/2011   Procedure: GAS/FLUID EXCHANGE;  Surgeon: Hayden Pedro, MD;  Location: [facility];  Service: Ophthalmology;  Laterality: Right;  . HERNIA REPAIR     "umbilical; as an infant"  . INTRAOCULAR LENS EXCHANGE  10/20/11   right eye  . PARS PLANA VITRECTOMY  10/20/2011   Procedure: PARS PLANA VITRECTOMY WITH 25G REMOVAL/SUTURE INTRAOCULAR LENS;  Surgeon: Hayden Pedro, MD;  Location: [facility];  Service: Ophthalmology;  Laterality: Right;  . RIGHT/LEFT HEART CATH AND CORONARY ANGIOGRAPHY N/A 01/20/2018   Procedure: RIGHT/LEFT HEART CATH AND CORONARY ANGIOGRAPHY;  Surgeon: Belva Crome, MD;  Location: [facility];  Service: Cardiovascular;  Laterality: N/A;    Current Outpatient Medications  Medication Sig Dispense Refill  . alclomethasone (ACLOVATE) 0.05 % cream Apply 1 application topically daily as needed (for rash). Forehead    . dofetilide (TIKOSYN) 250 MCG capsule TAKE 1 CAPSULE (250 MCG TOTAL) BY MOUTH 2 (TWO) TIMES DAILY. 180 capsule 3  . doxazosin (CARDURA) 8 MG tablet Take 8 mg by mouth every  evening.     Marland Kitchen ELIQUIS 5 MG TABS tablet TAKE 1 TABLET BY MOUTH TWICE A DAY 60 tablet 10  . fish oil-omega-3 fatty acids 1000 MG capsule Take 1 g by mouth every evening.     . furosemide (LASIX) 40 MG tablet TAKE 1 TABLET BY MOUTH EVERY DAY (Patient taking differently: Take 40 mg by mouth every morning. ) 90 tablet 1  . losartan (COZAAR) 100 MG tablet Take 1 tablet (100 mg total) by mouth daily. (Patient taking differently: Take 100 mg by mouth every morning. ) 90 tablet 3  . metoprolol succinate (TOPROL-XL) 25 MG 24 hr tablet Take 0.5  tablets (12.5 mg total) by mouth daily. 45 tablet 3  . Multiple Vitamin (MULITIVITAMIN WITH MINERALS) TABS Take 1 tablet by mouth every evening.     . pravastatin (PRAVACHOL) 80 MG tablet Take 80 mg by mouth at bedtime.     . RESTASIS 0.05 % ophthalmic emulsion Place 1 drop into both eyes 2 (two) times daily.     No current facility-administered medications for this visit.    Allergies:   Honey bee treatment [bee venom], Wasp venom, and Neosporin [neomycin-bacitracin zn-polymyx]   Social History:  The patient  reports that he has never smoked. He quit smokeless tobacco use about 11 years ago.  His smokeless tobacco use included chew. He reports that he does not drink alcohol or use drugs.   ROS:  Please see the history of present illness.   All other systems are personally reviewed and negative.    Exam:    Vital Signs:  There were no vitals taken for this visit.  Well sounding, alert and conversant   Labs/Other Tests and Data Reviewed:    Recent Labs: 08/14/2019: ALT 21; B Natriuretic Peptide 350.0; BUN 23; Creatinine, Ser 1.58; Hemoglobin 14.7; Platelets 193; Potassium 4.4; Sodium 141 08/17/2019: Magnesium 2.2   Wt Readings from Last 3 Encounters:  08/17/19 220 lb (99.8 kg)  08/14/19 220 lb (99.8 kg)  05/31/19 211 lb (95.7 kg)        ASSESSMENT & PLAN:    1.  Paroxysmal atrial fibrillation Well controlled with Tikosyn Continue Eliquis for CHADS2VASC of 3  2.  NICM Resolved with SR  3.  HTN Stable No change required today  4.  OSA Severe sleep apnea per recent sleep study We reviewed importance of treating today He is willing to use CPAP Will send message to Dr Theodosia Blender team to follow up with patient.    Risks, benefits and potential toxicities for medications prescribed and/or refilled reviewed with patient today.   Follow-up:  AF clinic in 3 months and me in 6 months    Patient Risk:  after full review of this patients clinical status, I feel that they are  at moderate risk at this time.  Today, I have spent 15 minutes with the patient with telehealth technology discussing arrhythmia management .    Army Fossa, MD  11/20/2019 10:48 AM     [facility] [address] [place] [address] [phone] (office) [phone] (fax)

## 2019-11-24 ENCOUNTER — Telehealth: Payer: Self-pay

## 2019-11-24 NOTE — Telephone Encounter (Signed)
Staff message to Gershon Cull to let her know precert is not required, pt has Medicare.

## 2019-11-24 NOTE — Telephone Encounter (Signed)
-----   Message from Freada Bergeron, [place] sent at 11/20/2019  5:22 PM EDT ----- Regarding: precert Cpap titration

## 2019-11-27 DIAGNOSIS — T63461D Toxic effect of venom of wasps, accidental (unintentional), subsequent encounter: Secondary | ICD-10-CM | POA: Diagnosis not present

## 2019-11-27 DIAGNOSIS — T63451D Toxic effect of venom of hornets, accidental (unintentional), subsequent encounter: Secondary | ICD-10-CM | POA: Diagnosis not present

## 2019-11-27 DIAGNOSIS — T63441D Toxic effect of venom of bees, accidental (unintentional), subsequent encounter: Secondary | ICD-10-CM | POA: Diagnosis not present

## 2019-11-29 NOTE — Telephone Encounter (Addendum)
Patient is scheduled for CPAP Titration on 12/20/19. PT is scheduled for COVID screening on 12/18/19 2 PM prior to titration.  Patient understands his titration study will be done at [facility] sleep lab. Patient understands he will receive a letter in a week or so detailing appointment, date, time, and location. Patient understands to call if he does not receive the letter  in a timely manner. Patient agrees with treatment and thanked me for call.

## 2019-12-14 ENCOUNTER — Telehealth: Payer: Self-pay | Admitting: *Deleted

## 2019-12-14 NOTE — Telephone Encounter (Signed)
Informed patient of sleep study results and patient understanding was verbalized. Patient understands his sleep study showed they have sleep apnea and recommend CPAP titration. Please set up titration in the sleep lab.   Titration sent to sleep pool  

## 2019-12-14 NOTE — Telephone Encounter (Signed)
-----   Message from Sueanne Margarita, MD sent at 11/14/2019  9:00 PM EDT ----- Please let patient know that they have sleep apnea and recommend CPAP titration. Please set up titration in the sleep lab.

## 2019-12-15 ENCOUNTER — Other Ambulatory Visit (HOSPITAL_COMMUNITY): Payer: Medicare Other

## 2019-12-15 NOTE — Telephone Encounter (Signed)
Patient is scheduled for CPAP Titration on 12/20/19. pt is scheduled for COVID screening on 12/18/19 12:15 prior to titration.  Patient understands his titration study will be done at [facility] sleep lab. Patient understands he will receive a letter in a week or so detailing appointment, date, time, and location. Patient understands to call if he does not receive the letter  in a timely manner. Patient agrees with treatment and thanked me for call.

## 2019-12-18 ENCOUNTER — Encounter (HOSPITAL_BASED_OUTPATIENT_CLINIC_OR_DEPARTMENT_OTHER): Payer: Medicare Other | Admitting: Cardiology

## 2019-12-18 ENCOUNTER — Other Ambulatory Visit: Payer: Self-pay

## 2019-12-18 ENCOUNTER — Other Ambulatory Visit (HOSPITAL_COMMUNITY)
Admission: RE | Admit: 2019-12-18 | Discharge: 2019-12-18 | Disposition: A | Discharge status: Home / Self Care | Payer: Medicare Other | Source: Ambulatory Visit | Attending: Cardiology | Admitting: Cardiology

## 2019-12-18 DIAGNOSIS — Z20822 Contact with and (suspected) exposure to covid-19: Secondary | ICD-10-CM | POA: Diagnosis not present

## 2019-12-18 DIAGNOSIS — Z01812 Encounter for preprocedural laboratory examination: Secondary | ICD-10-CM | POA: Diagnosis not present

## 2019-12-18 LAB — SARS CORONAVIRUS 2 (TAT 6-24 HRS): SARS Coronavirus 2: NEGATIVE

## 2019-12-19 DIAGNOSIS — I4891 Unspecified atrial fibrillation: Secondary | ICD-10-CM | POA: Diagnosis not present

## 2019-12-19 DIAGNOSIS — N189 Chronic kidney disease, unspecified: Secondary | ICD-10-CM | POA: Diagnosis not present

## 2019-12-19 DIAGNOSIS — E782 Mixed hyperlipidemia: Secondary | ICD-10-CM | POA: Diagnosis not present

## 2019-12-19 DIAGNOSIS — N400 Enlarged prostate without lower urinary tract symptoms: Secondary | ICD-10-CM | POA: Diagnosis not present

## 2019-12-19 DIAGNOSIS — I10 Essential (primary) hypertension: Secondary | ICD-10-CM | POA: Diagnosis not present

## 2019-12-20 ENCOUNTER — Ambulatory Visit: Payer: Medicare Other | Attending: Cardiology | Admitting: Cardiology

## 2019-12-20 ENCOUNTER — Other Ambulatory Visit: Payer: Self-pay

## 2019-12-20 DIAGNOSIS — G4733 Obstructive sleep apnea (adult) (pediatric): Secondary | ICD-10-CM

## 2019-12-20 DIAGNOSIS — I493 Ventricular premature depolarization: Secondary | ICD-10-CM | POA: Insufficient documentation

## 2019-12-20 DIAGNOSIS — I471 Supraventricular tachycardia: Secondary | ICD-10-CM | POA: Insufficient documentation

## 2019-12-23 NOTE — Procedures (Signed)
    Patient Name: John Cobb, John Cobb Date: 12/20/2019 Gender: Male D.O.B: 01/26/1946 Age (years): 73 Referring Provider: Sherran Needs Height (inches): 65 Interpreting Physician: Fransico Him MD, ABSM Weight (lbs): 220 RPSGT: Peak, Robert BMI: 30 MRN: [id number] Neck Size: 16.00  CLINICAL INFORMATION The patient is referred for a CPAP titration to treat sleep apnea.  SLEEP STUDY TECHNIQUE As per the AASM Manual for the Scoring of Sleep and Associated Events v2.3 (April 2016) with a hypopnea requiring 4% desaturations.  The channels recorded and monitored were frontal, central and occipital EEG, electrooculogram (EOG), submentalis EMG (chin), nasal and oral airflow, thoracic and abdominal wall motion, anterior tibialis EMG, snore microphone, electrocardiogram, and pulse oximetry. Continuous positive airway pressure (CPAP) was initiated at the beginning of the study and titrated to treat sleep-disordered breathing.  MEDICATIONS Medications self-administered by patient taken the night of the study : N/A  TECHNICIAN COMMENTS Comments added by technician: Patient was adherent to CPAP pressure and full face mask. Comments added by scorer: N/A  RESPIRATORY PARAMETERS Optimal PAP Pressure (cm): 10  AHI at Optimal Pressure (/hr):1.1 Overall Minimal O2 (%):89.0  Supine % at Optimal Pressure (%):0 Minimal O2 at Optimal Pressure (%): 90.0   SLEEP ARCHITECTURE The study was initiated at 10:42:04 PM and ended at 4:44:54 AM.  Sleep onset time was 37.5 minutes and the sleep efficiency was 52.6%. The total sleep time was 190.9 minutes.  The patient spent 2.6% of the night in stage N1 sleep, 59.1% in stage N2 sleep, 0.5% in stage N3 and 37.7% in REM.Stage REM latency was 192.5 minutes  Wake after sleep onset was 134.5. Alpha intrusion was absent. Supine sleep was 0.00%.  CARDIAC DATA The 2 lead EKG demonstrated sinus rhythm. The mean heart rate was 47.5 beats per minute. Other EKG  findings include: PVCs, PACs and nonsustained atrial tachcyardia.  LEG MOVEMENT DATA The total Periodic Limb Movements of Sleep (PLMS) were 3. The PLMS index was 0.9. A PLMS index of <15 is considered normal in adults.  IMPRESSIONS - The optimal PAP pressure was 10 cm of water. - Central sleep apnea was not noted during this titration (CAI = 1.6/h). - Mild oxygen desaturations were observed during this titration (min O2 = 89.0%). - No snoring was audible during this study. - 2-lead EKG demonstrated: PVCs, PACs and nonsustained atrial tachycardia. - Clinically significant periodic limb movements were not noted during this study. Arousals associated with PLMs were rare.  DIAGNOSIS - Obstructive Sleep Apnea (327.23 [G47.33 ICD-10]) - Nonsustained atrial tachycardia.  RECOMMENDATIONS - Trial of CPAP therapy on 10 cm H2O with a Medium Wide size Philips Respironics Full Face Mask Dreamwear mask and heated humidification. - Avoid alcohol, sedatives and other CNS depressants that may worsen sleep apnea and disrupt normal sleep architecture. - Sleep hygiene should be reviewed to assess factors that may improve sleep quality. - Weight management and regular exercise should be initiated or continued. - Return to Sleep Center for re-evaluation after 10 weeks of therapy  [Electronically signed] 12/23/2019 09:11 PM  Fransico Him MD, ABSM Diplomate, American Board of Sleep Medicine

## 2020-01-03 ENCOUNTER — Telehealth: Payer: Self-pay | Admitting: *Deleted

## 2020-01-03 NOTE — Telephone Encounter (Signed)
Informed patient of sleep study results and patient understanding was verbalized. Patient understands his sleep study showed they had a successful PAP titration and let DME know that orders are in EPIC. Please set up 8 eek OV with me.    DME selection is [facility]. Patient understands he will be contacted by [facility] to set up his cpap. Patient understands to call if [facility] does not contact him with new setup in a timely manner. Patient understands they will be called once confirmation has been received from [facility] that they have received their new machine to schedule 10 week follow up appointment.  [facility] notified of new cpap order  Please add to airview Patient was grateful for the call and thanked me.

## 2020-01-03 NOTE — Telephone Encounter (Signed)
-----   Message from Sueanne Margarita, MD sent at 12/23/2019  9:14 PM EDT ----- Please let patient know that they had a successful PAP titration and let DME know that orders are in EPIC.  Please set up 8 eek OV with me.

## 2020-02-05 DIAGNOSIS — T63461D Toxic effect of venom of wasps, accidental (unintentional), subsequent encounter: Secondary | ICD-10-CM | POA: Diagnosis not present

## 2020-02-05 DIAGNOSIS — T63451D Toxic effect of venom of hornets, accidental (unintentional), subsequent encounter: Secondary | ICD-10-CM | POA: Diagnosis not present

## 2020-02-05 DIAGNOSIS — T63441D Toxic effect of venom of bees, accidental (unintentional), subsequent encounter: Secondary | ICD-10-CM | POA: Diagnosis not present

## 2020-02-10 DIAGNOSIS — N400 Enlarged prostate without lower urinary tract symptoms: Secondary | ICD-10-CM | POA: Diagnosis not present

## 2020-02-10 DIAGNOSIS — N189 Chronic kidney disease, unspecified: Secondary | ICD-10-CM | POA: Diagnosis not present

## 2020-02-10 DIAGNOSIS — I10 Essential (primary) hypertension: Secondary | ICD-10-CM | POA: Diagnosis not present

## 2020-02-10 DIAGNOSIS — I4891 Unspecified atrial fibrillation: Secondary | ICD-10-CM | POA: Diagnosis not present

## 2020-02-10 DIAGNOSIS — E782 Mixed hyperlipidemia: Secondary | ICD-10-CM | POA: Diagnosis not present

## 2020-02-21 ENCOUNTER — Ambulatory Visit (HOSPITAL_COMMUNITY)
Admission: RE | Admit: 2020-02-21 | Discharge: 2020-02-21 | Disposition: A | Discharge status: Home / Self Care | Payer: Medicare Other | Source: Ambulatory Visit | Attending: Nurse Practitioner | Admitting: Nurse Practitioner

## 2020-02-21 ENCOUNTER — Other Ambulatory Visit: Payer: Self-pay

## 2020-02-21 ENCOUNTER — Encounter (HOSPITAL_COMMUNITY): Payer: Self-pay | Admitting: Nurse Practitioner

## 2020-02-21 VITALS — BP 128/74 | HR 72 | Ht 72.0 in | Wt 207.2 lb

## 2020-02-21 DIAGNOSIS — N183 Chronic kidney disease, stage 3 unspecified: Secondary | ICD-10-CM | POA: Diagnosis not present

## 2020-02-21 DIAGNOSIS — I129 Hypertensive chronic kidney disease with stage 1 through stage 4 chronic kidney disease, or unspecified chronic kidney disease: Secondary | ICD-10-CM | POA: Diagnosis not present

## 2020-02-21 DIAGNOSIS — D6869 Other thrombophilia: Secondary | ICD-10-CM | POA: Diagnosis not present

## 2020-02-21 DIAGNOSIS — I428 Other cardiomyopathies: Secondary | ICD-10-CM | POA: Diagnosis not present

## 2020-02-21 DIAGNOSIS — Z79899 Other long term (current) drug therapy: Secondary | ICD-10-CM | POA: Insufficient documentation

## 2020-02-21 DIAGNOSIS — I480 Paroxysmal atrial fibrillation: Secondary | ICD-10-CM | POA: Diagnosis not present

## 2020-02-21 DIAGNOSIS — Z8249 Family history of ischemic heart disease and other diseases of the circulatory system: Secondary | ICD-10-CM | POA: Insufficient documentation

## 2020-02-21 DIAGNOSIS — Z7901 Long term (current) use of anticoagulants: Secondary | ICD-10-CM | POA: Diagnosis not present

## 2020-02-21 DIAGNOSIS — I4819 Other persistent atrial fibrillation: Secondary | ICD-10-CM

## 2020-02-21 LAB — MAGNESIUM: Magnesium: 1.9 mg/dL (ref 1.7–2.4)

## 2020-02-21 LAB — BASIC METABOLIC PANEL
Anion gap: 11 (ref 5–15)
BUN: 17 mg/dL (ref 8–23)
CO2: 27 mmol/L (ref 22–32)
Calcium: 9.2 mg/dL (ref 8.9–10.3)
Chloride: 98 mmol/L (ref 98–111)
Creatinine, Ser: 1.33 mg/dL — ABNORMAL HIGH (ref 0.61–1.24)
GFR calc Af Amer: >60 mL/min (ref >60)
GFR calc non Af Amer: 53 mL/min — ABNORMAL LOW (ref >60)
Glucose, Bld: 316 mg/dL — ABNORMAL HIGH (ref 70–99)
Potassium: 4.3 mmol/L (ref 3.5–5.1)
Sodium: 136 mmol/L (ref 135–145)

## 2020-02-21 NOTE — Progress Notes (Signed)
Primary Care Physician: John Fess, MD Referring Physician: Dr. Lorin Cobb is a 74 y.o. male with a h/o afib on Tikosyn here for f/u ER visit for afib associated with hypotension and lightheadedness. It came on in the middle of the night.  He can not think of any triggers other than he has been told he snores.  He will often catch him self waking him self up trying to catch his breath. He is being compliant with meds.  He first went to urgent care and then to the ER. He was concerned with the systolic BP at home in the 70's. In the ER, it was 120/80 with a HR of 135 bpm in afib. Right before cardioversion he went back into SR. He has remained in SR. He continues on tikosyn 250 mcg bid with eliquis 5 mg bid with CHA2DS2VASc score of 2.   F/u in afib clinic 02/21/20. He reports no further afib episodes. He feels well. He had a CPAP titration trial in June and the DME company has not been in contact with pt to set up his CPAP. Will message Dr. Theodosia Cobb nurse to facilitate this. Continues on tikosyn. Qt is stable. No issues with  eliquis 5 mg bid for a CHA2DS2VASc score of 2.   Today, he denies symptoms of palpitations, chest pain, shortness of breath, orthopnea, PND, lower extremity edema, dizziness, presyncope, syncope, or neurologic sequela. The patient is tolerating medications without difficulties and is otherwise without complaint today.   Past Medical History:  Diagnosis Date  . Arthritis   . Benign essential HTN 01/05/2018  . Blood transfusion    As an infant  . CKD (chronic kidney disease), stage III   . Dislocated IOL (intraocular lens), posterior 10/02/2011  . High cholesterol   . Hypertension   . Nonischemic cardiomyopathy ([place]) 04/19/2018  . Persistent atrial fibrillation ([place])   . Pneumonia ~ 1978   Past Surgical History:  Procedure Laterality Date  . ANTERIOR CERVICAL DISCECTOMY  2008  . APPENDECTOMY     "as an infant"  . CARDIOVERSION N/A 03/11/2018    Procedure: CARDIOVERSION;  Surgeon: [name], MD;  Location: [facility] ENDOSCOPY;  Service: Cardiovascular;  Laterality: N/A;  . CARDIOVERSION N/A 04/21/2018   Procedure: CARDIOVERSION;  Surgeon: [name], MD;  Location: [place];  Service: Cardiovascular;  Laterality: N/A;  . CATARACT EXTRACTION W/ INTRAOCULAR LENS IMPLANT  08/13/11   left  . CATARACT EXTRACTION W/ INTRAOCULAR LENS IMPLANT  09/03/11   right  . Colonscopy    . Compound Fractures  06/1980   right arm; repaired with 2 plates and 10 pins.  . FRACTURE SURGERY    . GAS/FLUID EXCHANGE  10/20/2011   Procedure: GAS/FLUID EXCHANGE;  Surgeon: Hayden Pedro, MD;  Location: [place];  Service: Ophthalmology;  Laterality: Right;  . HERNIA REPAIR     "umbilical; as an infant"  . INTRAOCULAR LENS EXCHANGE  10/20/11   right eye  . PARS PLANA VITRECTOMY  10/20/2011   Procedure: PARS PLANA VITRECTOMY WITH 25G REMOVAL/SUTURE INTRAOCULAR LENS;  Surgeon: Hayden Pedro, MD;  Location: [place];  Service: Ophthalmology;  Laterality: Right;  . RIGHT/LEFT HEART CATH AND CORONARY ANGIOGRAPHY N/A 01/20/2018   Procedure: RIGHT/LEFT HEART CATH AND CORONARY ANGIOGRAPHY;  Surgeon: Belva Crome, MD;  Location: [place] CV LAB;  Service: Cardiovascular;  Laterality: N/A;    Current Outpatient Medications  Medication Sig Dispense Refill  . alclomethasone (ACLOVATE) 0.05 % cream Apply  1 application topically daily as needed (for rash). Forehead    . dofetilide (TIKOSYN) 250 MCG capsule TAKE 1 CAPSULE (250 MCG TOTAL) BY MOUTH 2 (TWO) TIMES DAILY. 180 capsule 3  . doxazosin (CARDURA) 8 MG tablet Take 8 mg by mouth every evening.     Marland Kitchen ELIQUIS 5 MG TABS tablet TAKE 1 TABLET BY MOUTH TWICE A DAY 60 tablet 10  . fish oil-omega-3 fatty acids 1000 MG capsule Take 1 g by mouth every evening.     . furosemide (LASIX) 40 MG tablet TAKE 1 TABLET BY MOUTH EVERY DAY (Patient taking differently: Take 40 mg by mouth every morning. ) 90 tablet 1  . losartan  (COZAAR) 100 MG tablet Take 1 tablet (100 mg total) by mouth daily. (Patient taking differently: Take 100 mg by mouth every morning. ) 90 tablet 3  . metoprolol succinate (TOPROL-XL) 25 MG 24 hr tablet Take 0.5 tablets (12.5 mg total) by mouth daily. 45 tablet 3  . Multiple Vitamin (MULITIVITAMIN WITH MINERALS) TABS Take 1 tablet by mouth every evening.     . pravastatin (PRAVACHOL) 80 MG tablet Take 80 mg by mouth at bedtime.     . RESTASIS 0.05 % ophthalmic emulsion Place 1 drop into both eyes 2 (two) times daily.     No current facility-administered medications for this encounter.    Allergies  Allergen Reactions  . Honey Bee Treatment [Bee Venom] Anaphylaxis, Hives and Swelling  . Wasp Venom Anaphylaxis, Hives and Swelling  . Neosporin [Neomycin-Bacitracin Zn-Polymyx] Rash    Social History   Socioeconomic History  . Marital status: Married    Spouse name: Not on file  . Number of children: Not on file  . Years of education: Not on file  . Highest education level: Not on file  Occupational History  . Not on file  Tobacco Use  . Smoking status: Never Smoker  . Smokeless tobacco: Former Systems developer    Types: Secondary school teacher  . Vaping Use: Never used  Substance and Sexual Activity  . Alcohol use: No  . Drug use: No  . Sexual activity: Not Currently  Other Topics Concern  . Not on file  Social History Narrative  . Not on file   Social Determinants of Health   Financial Resource Strain:   . Difficulty of Paying Living Expenses:   Food Insecurity:   . Worried About Charity fundraiser in the Last Year:   . Arboriculturist in the Last Year:   Transportation Needs:   . Film/video editor (Medical):   Marland Kitchen Lack of Transportation (Non-Medical):   Physical Activity:   . Days of Exercise per Week:   . Minutes of Exercise per Session:   Stress:   . Feeling of Stress :   Social Connections:   . Frequency of Communication with Friends and Family:   . Frequency of Social  Gatherings with Friends and Family:   . Attends Religious Services:   . Active Member of Clubs or Organizations:   . Attends Archivist Meetings:   Marland Kitchen Marital Status:   Intimate Partner Violence:   . Fear of Current or Ex-Partner:   . Emotionally Abused:   Marland Kitchen Physically Abused:   . Sexually Abused:     Family History  Problem Relation Age of Onset  . Lung cancer Mother   . Stomach cancer Mother   . Heart attack Father   . Anesthesia problems Neg Hx  ROS- All systems are reviewed and negative except as per the HPI above  Physical Exam: Vitals:   02/21/20 1107  BP: 128/74  Pulse: 72  SpO2: 98%  Weight: 94 kg  Height: 6' (1.829 m)   Wt Readings from Last 3 Encounters:  02/21/20 94 kg  08/17/19 99.8 kg  08/14/19 99.8 kg    Labs: Lab Results  Component Value Date   NA 141 08/14/2019   K 4.4 08/14/2019   CL 106 08/14/2019   CO2 27 08/14/2019   GLUCOSE 132 (H) 08/14/2019   BUN 23 08/14/2019   CREATININE 1.58 (H) 08/14/2019   CALCIUM 8.5 (L) 08/14/2019   MG 2.2 08/17/2019   No results found for: INR No results found for: CHOL, HDL, LDLCALC, TRIG   GEN- The patient is well appearing, alert and oriented x 3 today.   Head- normocephalic, atraumatic Eyes-  Sclera clear, conjunctiva pink Ears- hearing intact Oropharynx- clear Neck- supple, no JVP Lymph- no cervical lymphadenopathy Lungs- Clear to ausculation bilaterally, normal work of breathing Heart- Regular rate and rhythm, no murmurs, rubs or gallops, PMI not laterally displaced GI- soft, NT, ND, + BS Extremities- no clubbing, cyanosis, or edema MS- no significant deformity or atrophy Skin- no rash or lesion Psych- euthymic mood, full affect Neuro- strength and sensation are intact  EKG-Sinus brady at 61 bpm, PR int 132 ms, qrs int 74 ms, qtc 459 ms Epic records reviewed Echo-2/20-IMPRESSIONS    1. The left ventricle has normal systolic function, with an ejection fraction of 55-60%. The  cavity size was normal. There is mild concentric left ventricular hypertrophy. Left ventricular diastolic Doppler parameters are consistent with impaired  relaxation.  2. The right ventricle has normal systolic function. The cavity was normal. There is no increase in right ventricular wall thickness.  3. The mitral valve is normal in structure.  4. The tricuspid valve is normal in structure.  5. The aortic valve is tricuspid There is mild thickening and mild calcification of the aortic valve. Aortic valve regurgitation is trivial by color flow Doppler.  6. The pulmonic valve was normal in structure.  7. Right atrial pressure is estimated at 3 mmHg.  FINDINGS  Left Ventricle: The left ventricle has normal systolic function, with an ejection fraction of 55-60%. The cavity size was normal. There is mild concentric left ventricular hypertrophy. Left ventricular diastolic Doppler parameters are consistent with  impaired relaxation Right Ventricle: The right ventricle has normal systolic function. The cavity was normal. There is no increase in right ventricular wall thickness. Left Atrium: left atrial size was normal in size Right Atrium: right atrial size was normal in size Right atrial pressure is estimated at 3 mmHg. Interatrial Septum: No atrial level shunt detected by color flow Doppler. Pericardium: There is no evidence of pericardial effusion. Mitral Valve: The mitral valve is normal in structure. Mitral valve regurgitation is trivial by color flow Doppler. Tricuspid Valve: The tricuspid valve is normal in structure. Tricuspid valve regurgitation was not visualized by color flow Doppler. Aortic Valve: The aortic valve is tricuspid There is mild thickening and mild calcification of the aortic valve. Aortic valve regurgitation is trivial by color flow Doppler. Pulmonic Valve: The pulmonic valve was normal in structure. Pulmonic valve regurgitation is not visualized by color flow Doppler. Venous:  The inferior vena cava measures 1.10 cm, is normal in size with greater than 50% respiratory variability.     Assessment and Plan: 1. Persistent  afib Has been doing very  well maintaining SR on tikosyn except for in February 2021,  breakthrough with pt self converting in ER  Continue dofetilide 250 mcg bid Continue metoprolol 12.5 mg daily Bmet/mag today  Positive  sleep study for OSA and pt has had CPAP trail but DME company did not contact pt  Will message Dr. Theodosia Cobb nurse to reach out to pt to assist  2. CHA2DS2VASc score of 3 Continue apixaban 5 mg bid    3. HTN  Stable   4. CHA2DS2VASc score of at least 2 Continue eliquis 5 mg bid   F/u in the fall with Dr. Rayann Heman  as scheduled   Geroge Baseman. Shahrukh Pasch, [facility] [address] [place], [place] [address] [phone]

## 2020-02-23 DIAGNOSIS — R350 Frequency of micturition: Secondary | ICD-10-CM | POA: Diagnosis not present

## 2020-02-23 DIAGNOSIS — N401 Enlarged prostate with lower urinary tract symptoms: Secondary | ICD-10-CM | POA: Diagnosis not present

## 2020-02-23 DIAGNOSIS — N411 Chronic prostatitis: Secondary | ICD-10-CM | POA: Diagnosis not present

## 2020-02-23 DIAGNOSIS — R300 Dysuria: Secondary | ICD-10-CM | POA: Diagnosis not present

## 2020-02-23 DIAGNOSIS — N5201 Erectile dysfunction due to arterial insufficiency: Secondary | ICD-10-CM | POA: Diagnosis not present

## 2020-02-23 DIAGNOSIS — R972 Elevated prostate specific antigen [PSA]: Secondary | ICD-10-CM | POA: Diagnosis not present

## 2020-03-04 DIAGNOSIS — T63441D Toxic effect of venom of bees, accidental (unintentional), subsequent encounter: Secondary | ICD-10-CM | POA: Diagnosis not present

## 2020-03-04 DIAGNOSIS — T63451D Toxic effect of venom of hornets, accidental (unintentional), subsequent encounter: Secondary | ICD-10-CM | POA: Diagnosis not present

## 2020-03-04 DIAGNOSIS — T63461D Toxic effect of venom of wasps, accidental (unintentional), subsequent encounter: Secondary | ICD-10-CM | POA: Diagnosis not present

## 2020-03-05 DIAGNOSIS — I10 Essential (primary) hypertension: Secondary | ICD-10-CM | POA: Diagnosis not present

## 2020-03-05 DIAGNOSIS — E782 Mixed hyperlipidemia: Secondary | ICD-10-CM | POA: Diagnosis not present

## 2020-03-05 DIAGNOSIS — I4891 Unspecified atrial fibrillation: Secondary | ICD-10-CM | POA: Diagnosis not present

## 2020-03-05 DIAGNOSIS — N189 Chronic kidney disease, unspecified: Secondary | ICD-10-CM | POA: Diagnosis not present

## 2020-03-05 DIAGNOSIS — N400 Enlarged prostate without lower urinary tract symptoms: Secondary | ICD-10-CM | POA: Diagnosis not present

## 2020-03-06 DIAGNOSIS — R319 Hematuria, unspecified: Secondary | ICD-10-CM | POA: Diagnosis not present

## 2020-03-07 DIAGNOSIS — N401 Enlarged prostate with lower urinary tract symptoms: Secondary | ICD-10-CM | POA: Diagnosis not present

## 2020-03-07 DIAGNOSIS — R300 Dysuria: Secondary | ICD-10-CM | POA: Diagnosis not present

## 2020-03-07 DIAGNOSIS — N411 Chronic prostatitis: Secondary | ICD-10-CM | POA: Diagnosis not present

## 2020-03-07 DIAGNOSIS — R351 Nocturia: Secondary | ICD-10-CM | POA: Diagnosis not present

## 2020-03-07 DIAGNOSIS — R310 Gross hematuria: Secondary | ICD-10-CM | POA: Diagnosis not present

## 2020-03-14 DIAGNOSIS — R310 Gross hematuria: Secondary | ICD-10-CM | POA: Diagnosis not present

## 2020-03-15 DIAGNOSIS — K449 Diaphragmatic hernia without obstruction or gangrene: Secondary | ICD-10-CM | POA: Diagnosis not present

## 2020-03-15 DIAGNOSIS — I700 Atherosclerosis of aorta: Secondary | ICD-10-CM | POA: Diagnosis not present

## 2020-03-15 DIAGNOSIS — N400 Enlarged prostate without lower urinary tract symptoms: Secondary | ICD-10-CM | POA: Diagnosis not present

## 2020-03-15 DIAGNOSIS — R310 Gross hematuria: Secondary | ICD-10-CM | POA: Diagnosis not present

## 2020-03-19 ENCOUNTER — Other Ambulatory Visit: Payer: Self-pay | Admitting: Physician Assistant

## 2020-03-20 ENCOUNTER — Telehealth (HOSPITAL_COMMUNITY): Payer: Self-pay | Admitting: *Deleted

## 2020-03-20 NOTE — Telephone Encounter (Signed)
Patient called in stating since yesterday he has been feeling very weak and lightheaded especially with standing. HR has been between 38-44 BP 100/70 - him and his wife are both awaiting test results for covid but pt is otherwise asymptomatic covid wise (wife has symptoms).  Per Roderic Palau NP hold metoprolol and call tomorrow afternoon with update of HR/BP. Instructed to inform PCP should covid test come back positive for further treatment. Pt will call tomorrow with update - instructed to change positions slowly and stay well hydrated to keep BP up.

## 2020-03-21 NOTE — Telephone Encounter (Signed)
Patient feeling improved off metoprolol. BP 138/86 HR lowest at 44. No weakness/dizziness noted today. He will continue holding metoprolol for now and monitor heart rates. If HRs should increase or rhythm change he will notify our office.

## 2020-03-25 ENCOUNTER — Telehealth (HOSPITAL_COMMUNITY): Payer: Self-pay | Admitting: *Deleted

## 2020-03-25 NOTE — Telephone Encounter (Signed)
Patient called in stating he went back into AF over weekend HR in the 110s BP 110/83 - pt restarted metoprolol about 1 hour ago - he will call in the AM with update of HRs. His wife is covid positive but pt states he has not heard from his testing from last week. Denies symptoms.

## 2020-03-26 NOTE — Telephone Encounter (Signed)
Pt called with update this morning - HR is back down in the 60s but his BP is 87/63 he has already taken his losartan this morning - he will move his metoprolol to bedtime. If BP is low in the morning he will hold on losartan and follow bps. Call with update later in week if BP continues being low.

## 2020-03-28 DIAGNOSIS — N400 Enlarged prostate without lower urinary tract symptoms: Secondary | ICD-10-CM | POA: Diagnosis not present

## 2020-03-28 DIAGNOSIS — N189 Chronic kidney disease, unspecified: Secondary | ICD-10-CM | POA: Diagnosis not present

## 2020-03-28 DIAGNOSIS — I4891 Unspecified atrial fibrillation: Secondary | ICD-10-CM | POA: Diagnosis not present

## 2020-03-28 DIAGNOSIS — I10 Essential (primary) hypertension: Secondary | ICD-10-CM | POA: Diagnosis not present

## 2020-03-28 DIAGNOSIS — E782 Mixed hyperlipidemia: Secondary | ICD-10-CM | POA: Diagnosis not present

## 2020-03-29 NOTE — Telephone Encounter (Signed)
Pt called stating feeling much improved HR 59 BP 135/80 he is taking his metoprolol at bedtime and tolerating this much better. Pt will call if issues arise.

## 2020-04-04 DIAGNOSIS — T63451D Toxic effect of venom of hornets, accidental (unintentional), subsequent encounter: Secondary | ICD-10-CM | POA: Diagnosis not present

## 2020-04-04 DIAGNOSIS — T63461D Toxic effect of venom of wasps, accidental (unintentional), subsequent encounter: Secondary | ICD-10-CM | POA: Diagnosis not present

## 2020-04-04 DIAGNOSIS — T63441D Toxic effect of venom of bees, accidental (unintentional), subsequent encounter: Secondary | ICD-10-CM | POA: Diagnosis not present

## 2020-04-05 DIAGNOSIS — T63461D Toxic effect of venom of wasps, accidental (unintentional), subsequent encounter: Secondary | ICD-10-CM | POA: Diagnosis not present

## 2020-04-05 DIAGNOSIS — T63441D Toxic effect of venom of bees, accidental (unintentional), subsequent encounter: Secondary | ICD-10-CM | POA: Diagnosis not present

## 2020-04-05 DIAGNOSIS — T63451D Toxic effect of venom of hornets, accidental (unintentional), subsequent encounter: Secondary | ICD-10-CM | POA: Diagnosis not present

## 2020-04-12 ENCOUNTER — Other Ambulatory Visit: Payer: Self-pay | Admitting: Cardiology

## 2020-04-22 DIAGNOSIS — N189 Chronic kidney disease, unspecified: Secondary | ICD-10-CM | POA: Diagnosis not present

## 2020-04-22 DIAGNOSIS — I4891 Unspecified atrial fibrillation: Secondary | ICD-10-CM | POA: Diagnosis not present

## 2020-04-22 DIAGNOSIS — E1169 Type 2 diabetes mellitus with other specified complication: Secondary | ICD-10-CM | POA: Diagnosis not present

## 2020-04-22 DIAGNOSIS — E782 Mixed hyperlipidemia: Secondary | ICD-10-CM | POA: Diagnosis not present

## 2020-04-22 DIAGNOSIS — N402 Nodular prostate without lower urinary tract symptoms: Secondary | ICD-10-CM | POA: Diagnosis not present

## 2020-04-22 DIAGNOSIS — I10 Essential (primary) hypertension: Secondary | ICD-10-CM | POA: Diagnosis not present

## 2020-04-22 DIAGNOSIS — N400 Enlarged prostate without lower urinary tract symptoms: Secondary | ICD-10-CM | POA: Diagnosis not present

## 2020-04-25 ENCOUNTER — Other Ambulatory Visit: Payer: Self-pay | Admitting: Cardiology

## 2020-04-25 ENCOUNTER — Other Ambulatory Visit: Payer: Self-pay | Admitting: Internal Medicine

## 2020-04-26 ENCOUNTER — Telehealth: Payer: Self-pay | Admitting: *Deleted

## 2020-04-26 NOTE — Telephone Encounter (Signed)
Called in refill on dofetilide 250 mcg BID as requested via escribe for patient as escribe system is currently down. I authorized a quantity of #180 with 1 additional refill.

## 2020-05-01 ENCOUNTER — Other Ambulatory Visit: Payer: Self-pay | Admitting: Cardiology

## 2020-05-08 DIAGNOSIS — Z0000 Encounter for general adult medical examination without abnormal findings: Secondary | ICD-10-CM | POA: Diagnosis not present

## 2020-05-08 DIAGNOSIS — R7301 Impaired fasting glucose: Secondary | ICD-10-CM | POA: Diagnosis not present

## 2020-05-08 DIAGNOSIS — Z91030 Bee allergy status: Secondary | ICD-10-CM | POA: Diagnosis not present

## 2020-05-08 DIAGNOSIS — I10 Essential (primary) hypertension: Secondary | ICD-10-CM | POA: Diagnosis not present

## 2020-05-08 DIAGNOSIS — R911 Solitary pulmonary nodule: Secondary | ICD-10-CM | POA: Diagnosis not present

## 2020-05-08 DIAGNOSIS — I4891 Unspecified atrial fibrillation: Secondary | ICD-10-CM | POA: Diagnosis not present

## 2020-05-08 DIAGNOSIS — Z86010 Personal history of colonic polyps: Secondary | ICD-10-CM | POA: Diagnosis not present

## 2020-05-08 DIAGNOSIS — N189 Chronic kidney disease, unspecified: Secondary | ICD-10-CM | POA: Diagnosis not present

## 2020-05-08 DIAGNOSIS — E782 Mixed hyperlipidemia: Secondary | ICD-10-CM | POA: Diagnosis not present

## 2020-05-08 DIAGNOSIS — N402 Nodular prostate without lower urinary tract symptoms: Secondary | ICD-10-CM | POA: Diagnosis not present

## 2020-05-08 DIAGNOSIS — Z1159 Encounter for screening for other viral diseases: Secondary | ICD-10-CM | POA: Diagnosis not present

## 2020-05-08 DIAGNOSIS — Z23 Encounter for immunization: Secondary | ICD-10-CM | POA: Diagnosis not present

## 2020-05-10 DIAGNOSIS — E1169 Type 2 diabetes mellitus with other specified complication: Secondary | ICD-10-CM | POA: Diagnosis not present

## 2020-05-20 ENCOUNTER — Ambulatory Visit (INDEPENDENT_AMBULATORY_CARE_PROVIDER_SITE_OTHER): Payer: Medicare Other | Admitting: Internal Medicine

## 2020-05-20 ENCOUNTER — Encounter: Payer: Self-pay | Admitting: Internal Medicine

## 2020-05-20 ENCOUNTER — Other Ambulatory Visit: Payer: Self-pay

## 2020-05-20 VITALS — BP 114/64 | HR 58 | Ht 72.0 in | Wt 208.8 lb

## 2020-05-20 DIAGNOSIS — I43 Cardiomyopathy in diseases classified elsewhere: Secondary | ICD-10-CM | POA: Diagnosis not present

## 2020-05-20 DIAGNOSIS — R000 Tachycardia, unspecified: Secondary | ICD-10-CM | POA: Diagnosis not present

## 2020-05-20 DIAGNOSIS — G4733 Obstructive sleep apnea (adult) (pediatric): Secondary | ICD-10-CM | POA: Diagnosis not present

## 2020-05-20 DIAGNOSIS — I10 Essential (primary) hypertension: Secondary | ICD-10-CM

## 2020-05-20 DIAGNOSIS — I4819 Other persistent atrial fibrillation: Secondary | ICD-10-CM | POA: Diagnosis not present

## 2020-05-20 DIAGNOSIS — I428 Other cardiomyopathies: Secondary | ICD-10-CM

## 2020-05-20 DIAGNOSIS — D6869 Other thrombophilia: Secondary | ICD-10-CM | POA: Diagnosis not present

## 2020-05-20 MED ORDER — FUROSEMIDE 20 MG PO TABS: 20.0000 mg | ORAL_TABLET | Freq: Every day | ORAL | 3 refills | Status: DC

## 2020-05-20 NOTE — Progress Notes (Signed)
PCP: Hulan Fess, MD   Primary EP: Dr Rayann Heman  John Cobb is a 74 y.o. male who presents today for routine electrophysiology followup.  Since last being seen in our clinic, the patient reports doing very well.  Today, he denies symptoms of palpitations, chest pain, shortness of breath,  lower extremity edema, dizziness, presyncope, or syncope.  The patient is otherwise without complaint today.   Past Medical History:  Diagnosis Date  . Arthritis   . Benign essential HTN 01/05/2018  . Blood transfusion    As an infant  . CKD (chronic kidney disease), stage III ([place])   . Dislocated IOL (intraocular lens), posterior 10/02/2011  . High cholesterol   . Hypertension   . Nonischemic cardiomyopathy ([place]) 04/19/2018  . Persistent atrial fibrillation ([place])   . Pneumonia ~ 1978   Past Surgical History:  Procedure Laterality Date  . ANTERIOR CERVICAL DISCECTOMY  2008  . APPENDECTOMY     "as an infant"  . CARDIOVERSION N/A 03/11/2018   Procedure: CARDIOVERSION;  Surgeon: [name], MD;  Location: [facility] ENDOSCOPY;  Service: Cardiovascular;  Laterality: N/A;  . CARDIOVERSION N/A 04/21/2018   Procedure: CARDIOVERSION;  Surgeon: [name], MD;  Location: [place];  Service: Cardiovascular;  Laterality: N/A;  . CATARACT EXTRACTION W/ INTRAOCULAR LENS IMPLANT  08/13/11   left  . CATARACT EXTRACTION W/ INTRAOCULAR LENS IMPLANT  09/03/11   right  . Colonscopy    . Compound Fractures  06/1980   right arm; repaired with 2 plates and 10 pins.  . FRACTURE SURGERY    . GAS/FLUID EXCHANGE  10/20/2011   Procedure: GAS/FLUID EXCHANGE;  Surgeon: Hayden Pedro, MD;  Location: [place];  Service: Ophthalmology;  Laterality: Right;  . HERNIA REPAIR     "umbilical; as an infant"  . INTRAOCULAR LENS EXCHANGE  10/20/11   right eye  . PARS PLANA VITRECTOMY  10/20/2011   Procedure: PARS PLANA VITRECTOMY WITH 25G REMOVAL/SUTURE INTRAOCULAR LENS;  Surgeon: Hayden Pedro, MD;  Location: [place];   Service: Ophthalmology;  Laterality: Right;  . RIGHT/LEFT HEART CATH AND CORONARY ANGIOGRAPHY N/A 01/20/2018   Procedure: RIGHT/LEFT HEART CATH AND CORONARY ANGIOGRAPHY;  Surgeon: Belva Crome, MD;  Location: [place] CV LAB;  Service: Cardiovascular;  Laterality: N/A;    ROS- all systems are reviewed and negatives except as per HPI above  Current Outpatient Medications  Medication Sig Dispense Refill  . alclomethasone (ACLOVATE) 0.05 % cream Apply 1 application topically daily as needed (for rash). Forehead    . dofetilide (TIKOSYN) 250 MCG capsule TAKE 1 CAPSULE (250 MCG TOTAL) BY MOUTH 2 (TWO) TIMES DAILY. 180 capsule 1  . doxazosin (CARDURA) 8 MG tablet Take 8 mg by mouth every evening.     Marland Kitchen ELIQUIS 5 MG TABS tablet TAKE 1 TABLET BY MOUTH TWICE A DAY 60 tablet 10  . fish oil-omega-3 fatty acids 1000 MG capsule Take 1 g by mouth every evening.     . furosemide (LASIX) 40 MG tablet TAKE 1 TABLET BY MOUTH DAILY. PLEASE MAKE AN APPT WITH DR. Radford Pax 30 tablet 0  . metoprolol succinate (TOPROL-XL) 25 MG 24 hr tablet TAKE 1/2 TABLET BY MOUTH EVERY DAY 45 tablet 2  . Multiple Vitamin (MULITIVITAMIN WITH MINERALS) TABS Take 1 tablet by mouth every evening.     . pravastatin (PRAVACHOL) 80 MG tablet Take 80 mg by mouth at bedtime.     . RESTASIS 0.05 % ophthalmic emulsion Place 1 drop into  both eyes 2 (two) times daily.    Marland Kitchen losartan (COZAAR) 100 MG tablet Take 1 tablet (100 mg total) by mouth daily. (Patient taking differently: Take 100 mg by mouth every morning. ) 90 tablet 3   No current facility-administered medications for this visit.    Physical Exam: Vitals:   05/20/20 1156  BP: 114/64  Pulse: (!) 58  SpO2: 96%  Weight: 208 lb 12.8 oz (94.7 kg)  Height: 6' (1.829 m)    GEN- The patient is well appearing, alert and oriented x 3 today.   Head- normocephalic, atraumatic Eyes-  Sclera clear, conjunctiva pink Ears- hearing intact Oropharynx- clear Lungs- Clear to ausculation  bilaterally, normal work of breathing Heart- Regular rate and rhythm, no murmurs, rubs or gallops, PMI not laterally displaced GI- soft, NT, ND, + BS Extremities- no clubbing, cyanosis, or edema  Wt Readings from Last 3 Encounters:  05/20/20 208 lb 12.8 oz (94.7 kg)  02/21/20 207 lb 3.2 oz (94 kg)  08/17/19 220 lb (99.8 kg)    EKG tracing ordered today is personally reviewed and shows sinus rhythm 58 bpm, PR 150 msec, QRS 76 msec, Qtc 428 msec  Assessment and Plan:  1. Persistent atrial fibrillation Well controlled with [name] We will need to follow closely on this medicine to avoid toxicity chads2vasc score is 2.  Continue eliquis Labs 02/21/20 reviewed We will repeat bmet, mg today We discussed treatment options for afib.  Should his afib return, he would be a good candidate for ablation.  For now, we will continue current plan  2. HTN Stable No change required today  3. OSA Severe OSA Compliance with CPAP is advised  4. Nonischemic/ tachycardia mediated CM Resolved with sinus Reduce lasix to 20mg  daily Sodium restriction advised  Risks, benefits and potential toxicities for medications prescribed and/or refilled reviewed with patient today.   Follow-up with Dr Radford Pax as scheduled Return to AF clinic in 6 months I will see in a year  Thompson Grayer MD, [facility] 05/20/2020 12:15 PM

## 2020-05-20 NOTE — Patient Instructions (Addendum)
Medication Instructions:  Reduce Lasix to 20 mg daily  *If you need a refill on your cardiac medications before your next appointment, please call your pharmacy*  Lab Work: BMET, [place]   If you have labs (blood work) drawn today and your tests are completely normal, you will receive your results only by: Marland Kitchen MyChart Message (if you have MyChart) OR . A paper copy in the mail If you have any lab test that is abnormal or we need to change your treatment, we will call you to review the results.  Testing/Procedures: None ordered.  Follow-Up: At [facility], you and your health needs are our priority.  As part of our continuing mission to provide you with exceptional heart care, we have created designated Provider Care Teams.  These Care Teams include your primary Cardiologist (physician) and Advanced Practice Providers (APPs -  Physician Assistants and Nurse Practitioners) who all work together to provide you with the care you need, when you need it.  We recommend signing up for the patient portal called "MyChart".  Sign up information is provided on this After Visit Summary.  MyChart is used to connect with patients for Virtual Visits (Telemedicine).  Patients are able to view lab/test results, encounter notes, upcoming appointments, etc.  Non-urgent messages can be sent to your provider as well.   To learn more about what you can do with MyChart, go to [email or web address].    Your next appointment:   Your physician wants you to follow-up in: 6 months with the Afib Clinic. 1 year with Dr. Rayann Heman. You will receive a reminder letter in the mail two months in advance. If you don't receive a letter, please call our office to schedule the follow-up appointment.    Other Instructions:

## 2020-05-21 LAB — BASIC METABOLIC PANEL
BUN/Creatinine Ratio: 15 (ref 10–24)
BUN: 22 mg/dL (ref 8–27)
CO2: 24 mmol/L (ref 20–29)
Calcium: 9.2 mg/dL (ref 8.6–10.2)
Chloride: 103 mmol/L (ref 96–106)
Creatinine, Ser: 1.42 mg/dL — ABNORMAL HIGH (ref 0.76–1.27)
GFR calc Af Amer: 56 mL/min/{1.73_m2} — ABNORMAL LOW (ref >59)
GFR calc non Af Amer: 49 mL/min/{1.73_m2} — ABNORMAL LOW (ref >59)
Glucose: 119 mg/dL — ABNORMAL HIGH (ref 65–99)
Potassium: 4.4 mmol/L (ref 3.5–5.2)
Sodium: 139 mmol/L (ref 134–144)

## 2020-05-21 LAB — MAGNESIUM: Magnesium: 2.2 mg/dL (ref 1.6–2.3)

## 2020-05-23 ENCOUNTER — Other Ambulatory Visit: Payer: Self-pay | Admitting: Cardiology

## 2020-05-28 DIAGNOSIS — I4891 Unspecified atrial fibrillation: Secondary | ICD-10-CM | POA: Diagnosis not present

## 2020-05-28 DIAGNOSIS — I10 Essential (primary) hypertension: Secondary | ICD-10-CM | POA: Diagnosis not present

## 2020-05-28 DIAGNOSIS — G4700 Insomnia, unspecified: Secondary | ICD-10-CM | POA: Diagnosis not present

## 2020-05-28 DIAGNOSIS — E1169 Type 2 diabetes mellitus with other specified complication: Secondary | ICD-10-CM | POA: Diagnosis not present

## 2020-05-28 DIAGNOSIS — N402 Nodular prostate without lower urinary tract symptoms: Secondary | ICD-10-CM | POA: Diagnosis not present

## 2020-05-28 DIAGNOSIS — E782 Mixed hyperlipidemia: Secondary | ICD-10-CM | POA: Diagnosis not present

## 2020-05-28 DIAGNOSIS — N189 Chronic kidney disease, unspecified: Secondary | ICD-10-CM | POA: Diagnosis not present

## 2020-05-28 DIAGNOSIS — N400 Enlarged prostate without lower urinary tract symptoms: Secondary | ICD-10-CM | POA: Diagnosis not present

## 2020-06-04 DIAGNOSIS — T63441D Toxic effect of venom of bees, accidental (unintentional), subsequent encounter: Secondary | ICD-10-CM | POA: Diagnosis not present

## 2020-06-04 DIAGNOSIS — T63461D Toxic effect of venom of wasps, accidental (unintentional), subsequent encounter: Secondary | ICD-10-CM | POA: Diagnosis not present

## 2020-06-04 DIAGNOSIS — T63451D Toxic effect of venom of hornets, accidental (unintentional), subsequent encounter: Secondary | ICD-10-CM | POA: Diagnosis not present

## 2020-06-11 DIAGNOSIS — Z23 Encounter for immunization: Secondary | ICD-10-CM | POA: Diagnosis not present

## 2020-06-13 DIAGNOSIS — R310 Gross hematuria: Secondary | ICD-10-CM | POA: Diagnosis not present

## 2020-06-13 DIAGNOSIS — N401 Enlarged prostate with lower urinary tract symptoms: Secondary | ICD-10-CM | POA: Diagnosis not present

## 2020-06-13 DIAGNOSIS — R3912 Poor urinary stream: Secondary | ICD-10-CM | POA: Diagnosis not present

## 2020-07-01 DIAGNOSIS — N390 Urinary tract infection, site not specified: Secondary | ICD-10-CM | POA: Diagnosis not present

## 2020-07-03 DIAGNOSIS — T63441D Toxic effect of venom of bees, accidental (unintentional), subsequent encounter: Secondary | ICD-10-CM | POA: Diagnosis not present

## 2020-07-03 DIAGNOSIS — T63451D Toxic effect of venom of hornets, accidental (unintentional), subsequent encounter: Secondary | ICD-10-CM | POA: Diagnosis not present

## 2020-07-03 DIAGNOSIS — T63461D Toxic effect of venom of wasps, accidental (unintentional), subsequent encounter: Secondary | ICD-10-CM | POA: Diagnosis not present

## 2020-07-10 NOTE — Progress Notes (Signed)
Cardiology Office Note    Date:  07/11/2020   ID:  John Cobb, DOB Aug 01, 1945, MRN [id number]  PCP:  Catha Gosselin, MD  Cardiologist:  Armanda Magic, MD   Chief Complaint  Patient presents with  . Sleep Apnea  . Hypertension    History of Present Illness:  John Cobb is a 74 y.o. male  with a history of hyperlipidemia, hypertension and PAF followed by Dr. Johney Frame and afib clinic.  He has had 2 DCCV in 2019 as well as a cardiac cath in 2019 showing mild ASCAD with 30% mLAD at the bifurcation of a large Diagonal with 30% pD1 and otherwise ostial and mid RCA luminal irregularities. LVEDP was elevated as well as PCW at 18-10mmHg and was felt to have a NICM possibly related to tachy medicated process from afib.  He eventually was placed on Tikosyn for maintenance of NSR and has been maintaining NSR and anticoagulated with Eliquis.    He was referred for sleep study and underwent PSG showing severe OSA with an AHI of 41.5/hr, Central Sleep apean with CSI 8.4/hr and nocturnal hypoxemia with O2 sats as low as 83%.  He had moderate snoring.  He underwent CPAP titration to 10cm H2O.    He is doing well with his CPAP device and thinks that he has gotten used to it.  He tolerates the mask and feels the pressure is adequate.  Since going on CPAP he feels rested in the am and has no significant daytime sleepiness.  He denies any significant mouth or nasal dryness or nasal congestion.  He does not think that he snores.   Past Medical History:  Diagnosis Date  . Arthritis   . Benign essential HTN 01/05/2018  . Blood transfusion    As an infant  . CKD (chronic kidney disease), stage III (HCC)   . Dislocated IOL (intraocular lens), posterior 10/02/2011  . High cholesterol   . Hypertension   . Nonischemic cardiomyopathy (HCC) 04/19/2018  . Persistent atrial fibrillation (HCC)   . Pneumonia ~ 1978    Past Surgical History:  Procedure Laterality Date  . ANTERIOR CERVICAL DISCECTOMY  2008   . APPENDECTOMY     "as an infant"  . CARDIOVERSION N/A 03/11/2018   Procedure: CARDIOVERSION;  Surgeon: Chilton Si, MD;  Location: [facility] ENDOSCOPY;  Service: Cardiovascular;  Laterality: N/A;  . CARDIOVERSION N/A 04/21/2018   Procedure: CARDIOVERSION;  Surgeon: Chilton Si, MD;  Location: [facility] ENDOSCOPY;  Service: Cardiovascular;  Laterality: N/A;  . CATARACT EXTRACTION W/ INTRAOCULAR LENS IMPLANT  08/13/11   left  . CATARACT EXTRACTION W/ INTRAOCULAR LENS IMPLANT  09/03/11   right  . Colonscopy    . Compound Fractures  06/1980   right arm; repaired with 2 plates and 10 pins.  . FRACTURE SURGERY    . GAS/FLUID EXCHANGE  10/20/2011   Procedure: GAS/FLUID EXCHANGE;  Surgeon: Sherrie George, MD;  Location: [facility] OR;  Service: Ophthalmology;  Laterality: Right;  . HERNIA REPAIR     "umbilical; as an infant"  . INTRAOCULAR LENS EXCHANGE  10/20/11   right eye  . PARS PLANA VITRECTOMY  10/20/2011   Procedure: PARS PLANA VITRECTOMY WITH 25G REMOVAL/SUTURE INTRAOCULAR LENS;  Surgeon: Sherrie George, MD;  Location: [facility] OR;  Service: Ophthalmology;  Laterality: Right;  . RIGHT/LEFT HEART CATH AND CORONARY ANGIOGRAPHY N/A 01/20/2018   Procedure: RIGHT/LEFT HEART CATH AND CORONARY ANGIOGRAPHY;  Surgeon: Lyn Records, MD;  Location: [facility] INVASIVE CV LAB;  Service: Cardiovascular;  Laterality: N/A;    Current Medications: Current Meds  Medication Sig  . alclomethasone (ACLOVATE) 0.05 % cream Apply 1 application topically daily as needed (for rash). Forehead  . dofetilide (TIKOSYN) 250 MCG capsule TAKE 1 CAPSULE (250 MCG TOTAL) BY MOUTH 2 (TWO) TIMES DAILY.  Marland Kitchen doxazosin (CARDURA) 8 MG tablet Take 8 mg by mouth every evening.   Marland Kitchen ELIQUIS 5 MG TABS tablet TAKE 1 TABLET BY MOUTH TWICE A DAY  . finasteride (PROSCAR) 5 MG tablet Take 5 mg by mouth daily.  . fish oil-omega-3 fatty acids 1000 MG capsule Take 1 g by mouth every evening.   . furosemide (LASIX) 20 MG tablet Take 1 tablet (20 mg total) by  mouth daily.  Marland Kitchen losartan (COZAAR) 100 MG tablet Take 1 tablet (100 mg total) by mouth daily.  . metoprolol succinate (TOPROL-XL) 25 MG 24 hr tablet TAKE 1/2 TABLET BY MOUTH EVERY DAY  . Multiple Vitamin (MULITIVITAMIN WITH MINERALS) TABS Take 1 tablet by mouth every evening.   . pravastatin (PRAVACHOL) 80 MG tablet Take 80 mg by mouth at bedtime.   . RESTASIS 0.05 % ophthalmic emulsion Place 1 drop into both eyes 2 (two) times daily.    Allergies:   Honey bee treatment [bee venom], Wasp venom, and Neosporin [neomycin-bacitracin zn-polymyx]   Social History   Socioeconomic History  . Marital status: Married    Spouse name: Not on file  . Number of children: Not on file  . Years of education: Not on file  . Highest education level: Not on file  Occupational History  . Not on file  Tobacco Use  . Smoking status: Never Smoker  . Smokeless tobacco: Former Systems developer    Types: Secondary school teacher  . Vaping Use: Never used  Substance and Sexual Activity  . Alcohol use: No  . Drug use: No  . Sexual activity: Not Currently  Other Topics Concern  . Not on file  Social History Narrative  . Not on file   Social Determinants of Health   Financial Resource Strain: Not on file  Food Insecurity: Not on file  Transportation Needs: Not on file  Physical Activity: Not on file  Stress: Not on file  Social Connections: Not on file     Family History:  The patient's family history includes Heart attack in his father; Lung cancer in his mother; Stomach cancer in his mother.   ROS:   Please see the history of present illness.    ROS All other systems reviewed and are negative.  No flowsheet data found.  PHYSICAL EXAM:   VS:  BP 130/80   Pulse (!) 59   Ht 6' (1.829 m)   Wt 201 lb 9.6 oz (91.4 kg)   SpO2 93%   BMI 27.34 kg/m    GEN: Well nourished, well developed in no acute distress HEENT: Normal NECK: No JVD; No carotid bruits LYMPHATICS: No lymphadenopathy CARDIAC:RRR, no murmurs,  rubs, gallops RESPIRATORY:  Clear to auscultation without rales, wheezing or rhonchi  ABDOMEN: Soft, non-tender, non-distended MUSCULOSKELETAL:  No edema; No deformity  SKIN: Warm and dry NEUROLOGIC:  Alert and oriented x 3 PSYCHIATRIC:  Normal affect    Wt Readings from Last 3 Encounters:  07/11/20 201 lb 9.6 oz (91.4 kg)  05/20/20 208 lb 12.8 oz (94.7 kg)  02/21/20 207 lb 3.2 oz (94 kg)      Studies/Labs Reviewed:   EKG:  EKG is not ordered today.  Recent  Labs: 08/14/2019: ALT 21; B Natriuretic Peptide 350.0; Hemoglobin 14.7; Platelets 193 05/20/2020: BUN 22; Creatinine, Ser 1.42; Magnesium 2.2; Potassium 4.4; Sodium 139   Lipid Panel No results found for: CHOL, TRIG, HDL, CHOLHDL, VLDL, LDLCALC, LDLDIRECT  Additional studies/ records that were reviewed today include:  Cardiac cath 2019, PSG, CPAP titration and PAP compliance download    ASSESSMENT:    1. OSA (obstructive sleep apnea)   2. Benign essential HTN      PLAN:  In order of problems listed above:  1.  OSA - The pathophysiology of obstructive sleep apnea , it's cardiovascular consequences & modes of treatment including CPAP were discused with the patient in detail & they evidenced understanding.  The patient is tolerating PAP therapy well without any problems. The PAP download was reviewed today and showed an AHI of 8/hr on 10 cm H2O with 100% compliance in using more than 4 hours nightly.  The patient has been using and benefiting from PAP use and will continue to benefit from therapy.  -he has never changed the cushion on his mask since October and his mask is leaking some which may increase his AHI -repeat download in 4 weeks after changing out cushion and if still high then will increase PAP pressure  2.  Hypertension  -BP adequately controlled on exam today -continue Toprol XL 12.5mg  daily, doxazosin 8mg  daily and Losartan 100mg  daily  Medication Adjustments/Labs and Tests Ordered: Current medicines are  reviewed at length with the patient today.  Concerns regarding medicines are outlined above.  Medication changes, Labs and Tests ordered today are listed in the Patient Instructions below.  There are no Patient Instructions on file for this visit.   Signed, Fransico Him, MD  07/11/2020 8:11 AM    [facility] [place], [place], [place]  [address] Phone: [phone]; Fax: [phone]

## 2020-07-11 ENCOUNTER — Encounter: Payer: Self-pay | Admitting: Cardiology

## 2020-07-11 ENCOUNTER — Other Ambulatory Visit: Payer: Self-pay

## 2020-07-11 ENCOUNTER — Ambulatory Visit (INDEPENDENT_AMBULATORY_CARE_PROVIDER_SITE_OTHER): Payer: Medicare Other | Admitting: Cardiology

## 2020-07-11 ENCOUNTER — Telehealth: Payer: Self-pay | Admitting: *Deleted

## 2020-07-11 VITALS — BP 130/80 | HR 59 | Ht 72.0 in | Wt 201.6 lb

## 2020-07-11 DIAGNOSIS — I10 Essential (primary) hypertension: Secondary | ICD-10-CM

## 2020-07-11 DIAGNOSIS — G4733 Obstructive sleep apnea (adult) (pediatric): Secondary | ICD-10-CM | POA: Diagnosis not present

## 2020-07-11 DIAGNOSIS — T63461D Toxic effect of venom of wasps, accidental (unintentional), subsequent encounter: Secondary | ICD-10-CM | POA: Diagnosis not present

## 2020-07-11 NOTE — Telephone Encounter (Signed)
-----   Message from Carlyle Overbey, RN sent at 07/11/2020  8:21 AM EST ----- Per Dr. Turner please call the patient's DME and order him a new cushion for his mask, set him up on an auto supply plan and get a download in 4 weeks.  Thanks! Carly   

## 2020-07-11 NOTE — Patient Instructions (Signed)
Medication Instructions:  Your physician recommends that you continue on your current medications as directed. Please refer to the Current Medication list given to you today.  *If you need a refill on your cardiac medications before your next appointment, please call your pharmacy*  Follow-Up: At [facility], you and your health needs are our priority.  As part of our continuing mission to provide you with exceptional heart care, we have created designated Provider Care Teams.  These Care Teams include your primary Cardiologist (physician) and Advanced Practice Providers (APPs -  Physician Assistants and Nurse Practitioners) who all work together to provide you with the care you need, when you need it.  We recommend signing up for the patient portal called "MyChart".  Sign up information is provided on this After Visit Summary.  MyChart is used to connect with patients for Virtual Visits (Telemedicine).  Patients are able to view lab/test results, encounter notes, upcoming appointments, etc.  Non-urgent messages can be sent to your provider as well.   To learn more about what you can do with MyChart, go to [email or web address].    Your next appointment:   1 year(s)  The format for your next appointment:   In Person   Provider:   Armanda Magic, MD   Other Instructions Coralee North will be in contact with you in regards to ordering a new cushion for your mask and setting you up on an auto supply plan. We will get a download in 4 weeks.

## 2020-07-17 ENCOUNTER — Telehealth: Payer: Self-pay | Admitting: *Deleted

## 2020-07-17 DIAGNOSIS — G4733 Obstructive sleep apnea (adult) (pediatric): Secondary | ICD-10-CM

## 2020-07-17 NOTE — Telephone Encounter (Signed)
-----   Message from Theresia Majors, RN sent at 07/11/2020  8:21 AM EST ----- Per Dr. Mayford Knife please call the patient's DME and order him a new cushion for his mask, set him up on an auto supply plan and get a download in 4 weeks.  Thanks! Carly

## 2020-07-17 NOTE — Telephone Encounter (Signed)
Order placed to [facility] for cushions and cpap supplies.

## 2020-07-30 DIAGNOSIS — G4733 Obstructive sleep apnea (adult) (pediatric): Secondary | ICD-10-CM | POA: Diagnosis not present

## 2020-08-06 DIAGNOSIS — E1169 Type 2 diabetes mellitus with other specified complication: Secondary | ICD-10-CM | POA: Diagnosis not present

## 2020-08-06 DIAGNOSIS — S5011XA Contusion of right forearm, initial encounter: Secondary | ICD-10-CM | POA: Diagnosis not present

## 2020-08-06 DIAGNOSIS — I4891 Unspecified atrial fibrillation: Secondary | ICD-10-CM | POA: Diagnosis not present

## 2020-08-06 DIAGNOSIS — I10 Essential (primary) hypertension: Secondary | ICD-10-CM | POA: Diagnosis not present

## 2020-08-06 DIAGNOSIS — N189 Chronic kidney disease, unspecified: Secondary | ICD-10-CM | POA: Diagnosis not present

## 2020-08-06 DIAGNOSIS — G4700 Insomnia, unspecified: Secondary | ICD-10-CM | POA: Diagnosis not present

## 2020-08-06 DIAGNOSIS — E782 Mixed hyperlipidemia: Secondary | ICD-10-CM | POA: Diagnosis not present

## 2020-08-08 DIAGNOSIS — T63461D Toxic effect of venom of wasps, accidental (unintentional), subsequent encounter: Secondary | ICD-10-CM | POA: Diagnosis not present

## 2020-08-08 DIAGNOSIS — T63421D Toxic effect of venom of ants, accidental (unintentional), subsequent encounter: Secondary | ICD-10-CM | POA: Diagnosis not present

## 2020-08-08 DIAGNOSIS — T63451D Toxic effect of venom of hornets, accidental (unintentional), subsequent encounter: Secondary | ICD-10-CM | POA: Diagnosis not present

## 2020-08-10 ENCOUNTER — Other Ambulatory Visit: Payer: Self-pay | Admitting: Internal Medicine

## 2020-08-12 NOTE — Telephone Encounter (Signed)
Pt last saw Dr Rayann Heman 05/20/20, last labs 05/20/20 Creat 1.42, age 75, weight 91.4kg, based on specified criteria pt is on appropriate dosage of Eliquis 5mg  BID for afib.  Will refill rx.

## 2020-08-21 DIAGNOSIS — G4733 Obstructive sleep apnea (adult) (pediatric): Secondary | ICD-10-CM | POA: Diagnosis not present

## 2020-08-29 DIAGNOSIS — L570 Actinic keratosis: Secondary | ICD-10-CM | POA: Diagnosis not present

## 2020-08-29 DIAGNOSIS — L821 Other seborrheic keratosis: Secondary | ICD-10-CM | POA: Diagnosis not present

## 2020-08-29 DIAGNOSIS — L578 Other skin changes due to chronic exposure to nonionizing radiation: Secondary | ICD-10-CM | POA: Diagnosis not present

## 2020-08-29 DIAGNOSIS — D225 Melanocytic nevi of trunk: Secondary | ICD-10-CM | POA: Diagnosis not present

## 2020-08-29 DIAGNOSIS — L814 Other melanin hyperpigmentation: Secondary | ICD-10-CM | POA: Diagnosis not present

## 2020-08-30 DIAGNOSIS — G4733 Obstructive sleep apnea (adult) (pediatric): Secondary | ICD-10-CM | POA: Diagnosis not present

## 2020-08-31 ENCOUNTER — Other Ambulatory Visit: Payer: Self-pay | Admitting: Cardiology

## 2020-09-03 DIAGNOSIS — T63451D Toxic effect of venom of hornets, accidental (unintentional), subsequent encounter: Secondary | ICD-10-CM | POA: Diagnosis not present

## 2020-09-03 DIAGNOSIS — E1169 Type 2 diabetes mellitus with other specified complication: Secondary | ICD-10-CM | POA: Diagnosis not present

## 2020-09-03 DIAGNOSIS — T63441D Toxic effect of venom of bees, accidental (unintentional), subsequent encounter: Secondary | ICD-10-CM | POA: Diagnosis not present

## 2020-09-03 DIAGNOSIS — N1831 Chronic kidney disease, stage 3a: Secondary | ICD-10-CM | POA: Diagnosis not present

## 2020-09-03 DIAGNOSIS — I10 Essential (primary) hypertension: Secondary | ICD-10-CM | POA: Diagnosis not present

## 2020-09-03 DIAGNOSIS — T63461D Toxic effect of venom of wasps, accidental (unintentional), subsequent encounter: Secondary | ICD-10-CM | POA: Diagnosis not present

## 2020-09-08 ENCOUNTER — Other Ambulatory Visit: Payer: Self-pay | Admitting: Cardiology

## 2020-09-25 ENCOUNTER — Other Ambulatory Visit: Payer: Self-pay | Admitting: Cardiology

## 2020-09-26 DIAGNOSIS — H59031 Cystoid macular edema following cataract surgery, right eye: Secondary | ICD-10-CM | POA: Diagnosis not present

## 2020-09-26 DIAGNOSIS — H5203 Hypermetropia, bilateral: Secondary | ICD-10-CM | POA: Diagnosis not present

## 2020-09-27 DIAGNOSIS — I10 Essential (primary) hypertension: Secondary | ICD-10-CM | POA: Diagnosis not present

## 2020-09-27 DIAGNOSIS — N189 Chronic kidney disease, unspecified: Secondary | ICD-10-CM | POA: Diagnosis not present

## 2020-09-27 DIAGNOSIS — E782 Mixed hyperlipidemia: Secondary | ICD-10-CM | POA: Diagnosis not present

## 2020-09-27 DIAGNOSIS — I4891 Unspecified atrial fibrillation: Secondary | ICD-10-CM | POA: Diagnosis not present

## 2020-09-27 DIAGNOSIS — G4700 Insomnia, unspecified: Secondary | ICD-10-CM | POA: Diagnosis not present

## 2020-09-27 DIAGNOSIS — G4733 Obstructive sleep apnea (adult) (pediatric): Secondary | ICD-10-CM | POA: Diagnosis not present

## 2020-09-27 DIAGNOSIS — N1831 Chronic kidney disease, stage 3a: Secondary | ICD-10-CM | POA: Diagnosis not present

## 2020-09-27 DIAGNOSIS — E1169 Type 2 diabetes mellitus with other specified complication: Secondary | ICD-10-CM | POA: Diagnosis not present

## 2020-10-06 ENCOUNTER — Emergency Department (HOSPITAL_COMMUNITY)
Admission: EM | Admit: 2020-10-06 | Discharge: 2020-10-06 | Disposition: A | Discharge status: Home / Self Care | Payer: Medicare Other | Attending: Emergency Medicine | Admitting: Emergency Medicine

## 2020-10-06 ENCOUNTER — Other Ambulatory Visit: Payer: Self-pay

## 2020-10-06 ENCOUNTER — Emergency Department (HOSPITAL_COMMUNITY): Payer: Medicare Other

## 2020-10-06 ENCOUNTER — Encounter (HOSPITAL_COMMUNITY): Payer: Self-pay | Admitting: Emergency Medicine

## 2020-10-06 DIAGNOSIS — Z79899 Other long term (current) drug therapy: Secondary | ICD-10-CM | POA: Insufficient documentation

## 2020-10-06 DIAGNOSIS — I130 Hypertensive heart and chronic kidney disease with heart failure and stage 1 through stage 4 chronic kidney disease, or unspecified chronic kidney disease: Secondary | ICD-10-CM | POA: Diagnosis not present

## 2020-10-06 DIAGNOSIS — Z87891 Personal history of nicotine dependence: Secondary | ICD-10-CM | POA: Diagnosis not present

## 2020-10-06 DIAGNOSIS — Z7901 Long term (current) use of anticoagulants: Secondary | ICD-10-CM | POA: Diagnosis not present

## 2020-10-06 DIAGNOSIS — I5020 Unspecified systolic (congestive) heart failure: Secondary | ICD-10-CM | POA: Diagnosis not present

## 2020-10-06 DIAGNOSIS — I4891 Unspecified atrial fibrillation: Secondary | ICD-10-CM | POA: Insufficient documentation

## 2020-10-06 DIAGNOSIS — M5134 Other intervertebral disc degeneration, thoracic region: Secondary | ICD-10-CM | POA: Diagnosis not present

## 2020-10-06 DIAGNOSIS — N183 Chronic kidney disease, stage 3 unspecified: Secondary | ICD-10-CM | POA: Insufficient documentation

## 2020-10-06 LAB — CBC
HCT: 46.9 % (ref 39.0–52.0)
Hemoglobin: 15.5 g/dL (ref 13.0–17.0)
MCH: 29.2 pg (ref 26.0–34.0)
MCHC: 33 g/dL (ref 30.0–36.0)
MCV: 88.5 fL (ref 80.0–100.0)
Platelets: 213 10*3/uL (ref 150–400)
RBC: 5.3 MIL/uL (ref 4.22–5.81)
RDW: 14 % (ref 11.5–15.5)
WBC: 8.3 10*3/uL (ref 4.0–10.5)
nRBC: 0 % (ref 0.0–0.2)

## 2020-10-06 LAB — BASIC METABOLIC PANEL
Anion gap: 6 (ref 5–15)
BUN: 17 mg/dL (ref 8–23)
CO2: 26 mmol/L (ref 22–32)
Calcium: 9.2 mg/dL (ref 8.9–10.3)
Chloride: 105 mmol/L (ref 98–111)
Creatinine, Ser: 1.54 mg/dL — ABNORMAL HIGH (ref 0.61–1.24)
GFR, Estimated: 47 mL/min — ABNORMAL LOW (ref >60)
Glucose, Bld: 162 mg/dL — ABNORMAL HIGH (ref 70–99)
Potassium: 4.6 mmol/L (ref 3.5–5.1)
Sodium: 137 mmol/L (ref 135–145)

## 2020-10-06 LAB — MAGNESIUM: Magnesium: 1.9 mg/dL (ref 1.7–2.4)

## 2020-10-06 MED ORDER — PROPOFOL 10 MG/ML IV BOLUS: 0.5000 mg/kg | Freq: Once | INTRAVENOUS | Status: DC

## 2020-10-06 MED ORDER — PROPOFOL 10 MG/ML IV BOLUS
INTRAVENOUS | Status: AC | PRN
  Administered 2020-10-06: 50 mg via INTRAVENOUS

## 2020-10-06 MED ORDER — PROPOFOL 10 MG/ML IV BOLUS
INTRAVENOUS | Status: AC
  Filled 2020-10-06: qty 20

## 2020-10-06 NOTE — ED Provider Notes (Signed)
[facility] EMERGENCY DEPARTMENT Provider Note   CSN: [id number] Arrival date & time: 10/06/20  1104     History Chief Complaint  Patient presents with  . Atrial Fibrillation    John Cobb is a 75 y.o. male.  HPI Patient is a 75 year old male with a past medical history significant for HTN, CKD 3, HLD, nonischemic cardiomyopathy, atrial fibrillation on Eliquis, Tikosyn and metoprolol  Patient states that yesterday morning at approximately 7:30 AM he was alerted by his watch that he was in A. fib.  He states that he can tell because he is somewhat lightheaded.  He states that this is his typical symptoms for his atrial fibrillation.  He denies any missed doses of Eliquis Tikosyn or metoprolol.  He denies any chest pain shortness of breath nausea or vomiting.  Denies any diaphoresis.  No abdominal pain.  He denies any other associate symptoms.  He states his lightheadedness he notices primarily when he is standing up and walking.  He states that he waited to see if his symptoms would resolve with an additional dose of his medications the next day however they did not.  He states that he came to the ER for his persistent atrial fibrillation/LH  Per note from Dr. Rayann Heman 05/20/2020 patient is noted that he would be a good candidate for ablation.   Notably patient was cardioverted 08/14/2019 in the emergency room with success and was discharged.      Past Medical History:  Diagnosis Date  . Arthritis   . Benign essential HTN 01/05/2018  . Blood transfusion    As an infant  . CKD (chronic kidney disease), stage III ([place])   . Dislocated IOL (intraocular lens), posterior 10/02/2011  . High cholesterol   . Hypertension   . Nonischemic cardiomyopathy ([place]) 04/19/2018  . Persistent atrial fibrillation ([place])   . Pneumonia ~ 1978    Patient Active Problem List   Diagnosis Date Noted  . Persistent atrial fibrillation ([place])   . Visit for monitoring Tikosyn therapy  04/19/2018  . Nonischemic cardiomyopathy ([place]) 02/02/2018  . CKD (chronic kidney disease) stage 3, GFR 30-59 ml/min (HCC) 02/02/2018  . Systolic HF (heart failure) ([place]) 01/20/2018  . Abnormal nuclear stress test 01/20/2018  . Abnormal stress test   . Paroxysmal atrial fibrillation ([employer]) 01/05/2018  . Benign essential HTN 01/05/2018  . SOB (shortness of breath) 01/05/2018  . Dislocated IOL (intraocular lens), posterior 10/02/2011    Past Surgical History:  Procedure Laterality Date  . ANTERIOR CERVICAL DISCECTOMY  2008  . APPENDECTOMY     "as an infant"  . CARDIOVERSION N/A 03/11/2018   Procedure: CARDIOVERSION;  Surgeon: [name], MD;  Location: [facility] ENDOSCOPY;  Service: Cardiovascular;  Laterality: N/A;  . CARDIOVERSION N/A 04/21/2018   Procedure: CARDIOVERSION;  Surgeon: [name], MD;  Location: [facility];  Service: Cardiovascular;  Laterality: N/A;  . CATARACT EXTRACTION W/ INTRAOCULAR LENS IMPLANT  08/13/11   left  . CATARACT EXTRACTION W/ INTRAOCULAR LENS IMPLANT  09/03/11   right  . Colonscopy    . Compound Fractures  06/1980   right arm; repaired with 2 plates and 10 pins.  . FRACTURE SURGERY    . GAS/FLUID EXCHANGE  10/20/2011   Procedure: GAS/FLUID EXCHANGE;  Surgeon: Hayden Pedro, MD;  Location: [facility];  Service: Ophthalmology;  Laterality: Right;  . HERNIA REPAIR     "umbilical; as an infant"  . INTRAOCULAR LENS EXCHANGE  10/20/11   right eye  .  PARS PLANA VITRECTOMY  10/20/2011   Procedure: PARS PLANA VITRECTOMY WITH 25G REMOVAL/SUTURE INTRAOCULAR LENS;  Surgeon: Hayden Pedro, MD;  Location: [facility];  Service: Ophthalmology;  Laterality: Right;  . RIGHT/LEFT HEART CATH AND CORONARY ANGIOGRAPHY N/A 01/20/2018   Procedure: RIGHT/LEFT HEART CATH AND CORONARY ANGIOGRAPHY;  Surgeon: Belva Crome, MD;  Location: [facility] CV LAB;  Service: Cardiovascular;  Laterality: N/A;       Family History  Problem Relation Age of Onset  . Lung cancer Mother    . Stomach cancer Mother   . Heart attack Father   . Anesthesia problems Neg Hx     Social History   Tobacco Use  . Smoking status: Never Smoker  . Smokeless tobacco: Former Systems developer    Types: Secondary school teacher  . Vaping Use: Never used  Substance Use Topics  . Alcohol use: No  . Drug use: No    Home Medications Prior to Admission medications   Medication Sig Start Date End Date Taking? Authorizing Provider  alclomethasone (ACLOVATE) 0.05 % cream Apply 1 application topically daily as needed (for rash). Forehead    [provider]  dofetilide (TIKOSYN) 250 MCG capsule TAKE 1 CAPSULE (250 MCG TOTAL) BY MOUTH 2 (TWO) TIMES DAILY. 04/26/20   Allred, Jeneen Rinks, MD  doxazosin (CARDURA) 8 MG tablet Take 8 mg by mouth every evening.  05/10/19   [provider]  ELIQUIS 5 MG TABS tablet TAKE 1 TABLET BY MOUTH TWICE A DAY 08/12/20   Allred, Jeneen Rinks, MD  finasteride (PROSCAR) 5 MG tablet Take 5 mg by mouth daily. 06/13/20   [provider]  fish oil-omega-3 fatty acids 1000 MG capsule Take 1 g by mouth every evening.     [provider]  furosemide (LASIX) 20 MG tablet Take 1 tablet (20 mg total) by mouth daily. 05/20/20   Allred, Jeneen Rinks, MD  losartan (COZAAR) 100 MG tablet Take 1 tablet (100 mg total) by mouth daily. 08/24/18 02/21/20  Allred, Jeneen Rinks, MD  metoprolol succinate (TOPROL-XL) 25 MG 24 hr tablet TAKE 1/2 TABLET BY MOUTH EVERY DAY 05/01/20   Sueanne Margarita, MD  Multiple Vitamin (MULITIVITAMIN WITH MINERALS) TABS Take 1 tablet by mouth every evening.     [provider]  pravastatin (PRAVACHOL) 80 MG tablet Take 80 mg by mouth at bedtime.     [provider]  RESTASIS 0.05 % ophthalmic emulsion Place 1 drop into both eyes 2 (two) times daily. 02/10/19   [provider]    Allergies    Honey bee treatment [bee venom], Wasp venom, and Neosporin [neomycin-bacitracin zn-polymyx]  Review of Systems   Review of Systems  Constitutional:  Negative for chills and fever.  HENT: Negative for congestion.   Eyes: Negative for pain.  Respiratory: Negative for cough and shortness of breath.   Cardiovascular: Negative for chest pain and leg swelling.  Gastrointestinal: Negative for abdominal pain, nausea and vomiting.  Genitourinary: Negative for dysuria.  Musculoskeletal: Negative for myalgias.  Skin: Negative for rash.  Neurological: Positive for light-headedness. Negative for dizziness and headaches.    Physical Exam Updated Vital Signs BP 132/77 (BP Location: Left Arm)   Pulse (!) 56   Temp (!) 97.5 F (36.4 C) (Oral)   Resp (!) 33   Wt 87.5 kg   SpO2 100%   BMI 26.18 kg/m   Physical Exam Vitals and nursing note reviewed.  Constitutional:      General: He is not in acute  distress.    Comments: Pleasant well-appearing 75 year old.  In no acute distress.  Sitting comfortably in bed.  Able answer questions appropriately follow commands. No increased work of breathing. Speaking in full sentences.  HENT:     Head: Normocephalic and atraumatic.     Nose: Nose normal.  Eyes:     General: No scleral icterus. Cardiovascular:     Rate and Rhythm: Normal rate and regular rhythm.     Pulses: Normal pulses.     Heart sounds: Normal heart sounds.     Comments: Irregular rhythm palpated.  Bilateral radial artery pulses are 3+ and symmetric. Pulmonary:     Effort: Pulmonary effort is normal. No respiratory distress.     Breath sounds: No wheezing.  Abdominal:     Palpations: Abdomen is soft.     Tenderness: There is no abdominal tenderness.  Musculoskeletal:     Cervical back: Normal range of motion.     Right lower leg: No edema.     Left lower leg: No edema.  Skin:    General: Skin is warm and dry.     Capillary Refill: Capillary refill takes less than 2 seconds.  Neurological:     Mental Status: He is alert. Mental status is at baseline.     Comments: Moves all 4 extremities, alert and oriented x3 sensation intact  bilateral upper extremities  Psychiatric:        Mood and Affect: Mood normal.        Behavior: Behavior normal.     ED Results / Procedures / Treatments   Labs (all labs ordered are listed, but only abnormal results are displayed) Labs Reviewed  BASIC METABOLIC PANEL - Abnormal; Notable for the following components:      Result Value   Glucose, Bld 162 (*)    Creatinine, Ser 1.54 (*)    GFR, Estimated 47 (*)    All other components within normal limits  CBC  MAGNESIUM    EKG EKG Interpretation  Date/Time:  Sunday October 06 2020 11:42:53 EDT Ventricular Rate:  97 PR Interval:    QRS Duration: 78 QT Interval:  357 QTC Calculation: 454 R Axis:   2 Text Interpretation: Atrial fibrillation Confirmed by Isla Pence [phone]) on 10/06/2020 11:56:07 AM   Radiology DG Chest 2 View  Result Date: 10/06/2020 CLINICAL DATA:  AFib EXAM: CHEST - 2 VIEW COMPARISON:  08/14/2019 FINDINGS: The heart size and mediastinal contours are within normal limits. Both lungs are clear. Disc degenerative disease of the thoracic spine. IMPRESSION: No acute abnormality of the lungs. Electronically Signed   By: Eddie Candle M.D.   On: 10/06/2020 11:33    Procedures .Cardioversion  Date/Time: 10/06/2020 1:39 PM Performed by: Tedd Sias, PA Authorized by: Tedd Sias, PA   Consent:    Consent obtained:  Verbal and written   Consent given by:  Patient   Risks discussed:  Induced arrhythmia, pain, cutaneous burn and death   Alternatives discussed:  No treatment, rate-control medication, referral and delayed treatment Pre-procedure details:    Rhythm:  Atrial fibrillation   Electrode placement:  Anterior-lateral Patient sedated: Yes. Refer to sedation procedure documentation for details of sedation.  Attempt one:    Cardioversion mode:  Synchronous   Waveform:  Biphasic   Shock (Joules):  120   Shock outcome:  Conversion to normal sinus rhythm Post-procedure details:    Patient  status:  Awake   Patient tolerance of procedure:  Tolerated well, no  immediate complications Comments:     Patient tolerated procedure well.  He is remained in normal sinus rhythm after cardioversion.  See attached documentation by attending physician for sedation.    .Critical Care Performed by: Tedd Sias, PA Authorized by: Tedd Sias, PA   Critical care provider statement:    Critical care time (minutes):  35   Critical care time was exclusive of:  Separately billable procedures and treating other patients and teaching time   Critical care was necessary to treat or prevent imminent or life-threatening deterioration of the following conditions: afib RVR.   Critical care was time spent personally by me on the following activities:  Discussions with consultants, evaluation of patient's response to treatment, examination of patient, review of old charts, re-evaluation of patient's condition, pulse oximetry, ordering and review of radiographic studies, ordering and review of laboratory studies and ordering and performing treatments and interventions   I assumed direction of critical care for this patient from another provider in my specialty: no       Medications Ordered in ED Medications  propofol (DIPRIVAN) 10 mg/mL bolus/IV push 43.8 mg (43.8 mg Intravenous Not Given 10/06/20 1230)  propofol (DIPRIVAN) 10 mg/mL bolus/IV push (50 mg Intravenous Given 10/06/20 1234)    ED Course  I have reviewed the triage vital signs and the nursing notes.  Pertinent labs & imaging results that were available during my care of the patient were reviewed by me and considered in my medical decision making (see chart for details).  Patient is a 75 year old male with a history of A. fib on Tikosyn and metoprolol and history of DC cardioversion which was successful in 2019 after being placed on Tikosyn.  Followed by [facility].  States that his episode began yesterday morning has been consistent since.   He notices that he is in atrial fibrillation because he feels somewhat lightheaded.  This was confirmed by his wife who told him he had a regular rhythm.  Physical exam is notable for regular rhythm.  Vital signs otherwise within normal limits.  Blood pressure within normal is.  BMP unremarkable.  No significant electrolyte derangements and creatinine seems to be at his baseline.  CBC believes it is due to anemia.  Magnesium within normal notes.  Chest x-ray unremarkable.  EKG notable for A. fib with a rate of approximately 120.  Clinical Course as of 10/06/20 1547  Sun Oct 06, 2020  1157 Discussed w Dr. Harrell Gave who is okay with DC cardioversion.  [WF]    Clinical Course User Index [WF] Tedd Sias, [place]   Patient given option for DC cardioversion versus chemical cardioversion versus expert consultation and referral.  Patient would prefer to be cardioverted at this time.  He is understanding of plan and indication contraindications.  Patient is successfully cardioverted with Dr. Gilford Raid.  Patient will follow up with cardiology.  Return precautions given.  We will continue to take Tikosyn, metoprolol and Eliquis as he has been.  All questions answered best my ability.   MDM Rules/Calculators/A&P                          Final Clinical Impression(s) / ED Diagnoses Final diagnoses:  Atrial fibrillation with rapid ventricular response [facility])    Rx / DC Orders ED Discharge Orders    None       Tedd Sias, [place] 10/06/20 1549    Isla Pence, MD 10/08/20 1100

## 2020-10-06 NOTE — ED Triage Notes (Addendum)
Reports afib since yesterday.  History of afib. Denies SOB and chest pain.  States he feels lightheaded.  Taking his medications.

## 2020-10-06 NOTE — Sedation Documentation (Signed)
Cardioversion delivered

## 2020-10-06 NOTE — Discharge Instructions (Addendum)
Please follow-up with your cardiologist.  Please continue take your Tikosyn and metoprolol as prescribed. May was return to the ER for any new or concerning symptoms.

## 2020-10-06 NOTE — ED Notes (Signed)
Pt transported to radiology.

## 2020-10-07 DIAGNOSIS — T63461D Toxic effect of venom of wasps, accidental (unintentional), subsequent encounter: Secondary | ICD-10-CM | POA: Diagnosis not present

## 2020-10-07 DIAGNOSIS — T63441D Toxic effect of venom of bees, accidental (unintentional), subsequent encounter: Secondary | ICD-10-CM | POA: Diagnosis not present

## 2020-10-07 DIAGNOSIS — T63451D Toxic effect of venom of hornets, accidental (unintentional), subsequent encounter: Secondary | ICD-10-CM | POA: Diagnosis not present

## 2020-10-08 NOTE — ED Provider Notes (Addendum)
  Physical Exam  BP 132/77 (BP Location: Left Arm)   Pulse (!) 56   Temp (!) 97.5 F (36.4 C) (Oral)   Resp (!) 33   Wt 87.5 kg   SpO2 100%   BMI 26.18 kg/m   Physical Exam  ED Course/Procedures   Clinical Course as of 10/08/20 1101  Sun Oct 06, 2020  1157 Discussed w Dr. Harrell Gave who is okay with DC cardioversion.  [WF]    Clinical Course User Index [WF] Tedd Sias, PA    .Sedation  Date/Time: 10/08/2020 11:01 AM Performed by: Isla Pence, MD Authorized by: Isla Pence, MD   Consent:    Consent obtained:  Written   Consent given by:  Patient   Alternatives discussed:  Analgesia without sedation Universal protocol:    Immediately prior to procedure, a time out was called: yes     Patient identity confirmed:  Verbally with patient Indications:    Procedure performed:  Cardioversion Pre-sedation assessment:    Time since last food or drink:  5   ASA classification: class 2 - patient with mild systemic disease     Mallampati score:  II - soft palate, uvula, fauces visible   Pre-sedation assessments completed and reviewed: airway patency, cardiovascular function, hydration status, mental status, nausea/vomiting and pain level   Immediate pre-procedure details:    Reassessment: Patient reassessed immediately prior to procedure     Reviewed: vital signs     Verified: bag valve mask available, emergency equipment available, intubation equipment available, IV patency confirmed and oxygen available   Procedure details (see MAR for exact dosages):    Preoxygenation:  Nasal cannula   Sedation:  Propofol   Intended level of sedation: deep   Intra-procedure monitoring:  Blood pressure monitoring, cardiac monitor, continuous capnometry and continuous pulse oximetry   Intra-procedure events: none     Total Provider sedation time (minutes):  30 Post-procedure details:    Attendance: Constant attendance by certified staff until patient recovered     Recovery:  Patient returned to pre-procedure baseline     Patient is stable for discharge or admission: yes     Procedure completion:  Tolerated well, no immediate complications    MDM   [id number] Stroke Risk Points  Current as of yesterday     4 >= 2 Points: High Risk  1 - 1.99 Points: Medium Risk  0 Points: Low Risk    No Change      Details    This score determines the patient's risk of having a stroke if the  patient has atrial fibrillation.       Points Metrics  1 Has Congestive Heart Failure:  Yes    Current as of yesterday  0 Has Vascular Disease:  No    Current as of yesterday  1 Has Hypertension:  Yes    Current as of yesterday  1 Age:  75    Current as of yesterday  1 Has Diabetes:  Yes     Current as of yesterday  0 Had Stroke:  No  Had TIA:  No  Had Thromboembolism:  No    Current as of yesterday  0 Male:  No    Current as of yesterday             Isla Pence, MD 10/08/20 [employer]    Isla Pence, MD 10/08/20 1106

## 2020-10-21 ENCOUNTER — Other Ambulatory Visit: Payer: Self-pay

## 2020-10-21 ENCOUNTER — Encounter (HOSPITAL_COMMUNITY): Payer: Self-pay | Admitting: Nurse Practitioner

## 2020-10-21 ENCOUNTER — Ambulatory Visit (HOSPITAL_COMMUNITY)
Admission: RE | Admit: 2020-10-21 | Discharge: 2020-10-21 | Disposition: A | Discharge status: Home / Self Care | Payer: Medicare Other | Source: Ambulatory Visit | Attending: Nurse Practitioner | Admitting: Nurse Practitioner

## 2020-10-21 VITALS — BP 132/76 | HR 51 | Ht 72.0 in | Wt 195.0 lb

## 2020-10-21 DIAGNOSIS — E7800 Pure hypercholesterolemia, unspecified: Secondary | ICD-10-CM | POA: Diagnosis not present

## 2020-10-21 DIAGNOSIS — G4733 Obstructive sleep apnea (adult) (pediatric): Secondary | ICD-10-CM | POA: Insufficient documentation

## 2020-10-21 DIAGNOSIS — I129 Hypertensive chronic kidney disease with stage 1 through stage 4 chronic kidney disease, or unspecified chronic kidney disease: Secondary | ICD-10-CM | POA: Diagnosis not present

## 2020-10-21 DIAGNOSIS — N183 Chronic kidney disease, stage 3 unspecified: Secondary | ICD-10-CM | POA: Diagnosis not present

## 2020-10-21 DIAGNOSIS — I700 Atherosclerosis of aorta: Secondary | ICD-10-CM | POA: Diagnosis not present

## 2020-10-21 DIAGNOSIS — Z79899 Other long term (current) drug therapy: Secondary | ICD-10-CM | POA: Insufficient documentation

## 2020-10-21 DIAGNOSIS — Z7901 Long term (current) use of anticoagulants: Secondary | ICD-10-CM | POA: Insufficient documentation

## 2020-10-21 DIAGNOSIS — I4819 Other persistent atrial fibrillation: Secondary | ICD-10-CM | POA: Insufficient documentation

## 2020-10-21 DIAGNOSIS — D6869 Other thrombophilia: Secondary | ICD-10-CM

## 2020-10-21 MED ORDER — DILTIAZEM HCL 30 MG PO TABS: ORAL_TABLET | ORAL | 1 refills | Status: DC

## 2020-10-21 NOTE — Progress Notes (Addendum)
Primary Care Physician: Hulan Fess, MD Referring Physician: Dr. Lorin Picket is a 75 y.o. male with a h/o afib on Tikosyn here for f/u ER visit for afib associated with hypotension and lightheadedness. It came on in the middle of the night.  He can not think of any triggers other than he has been told he snores.  He will often catch him self waking him self up trying to catch his breath. He is being compliant with meds.  He first went to urgent care and then to the ER. He was concerned with the systolic BP at home in the 70's. In the ER, it was 120/80 with a HR of 135 bpm in afib. Right before cardioversion he went back into SR. He has remained in SR. He continues on tikosyn 250 mcg bid with eliquis 5 mg bid with CHA2DS2VASc score of 2.   F/u in afib clinic 02/21/20. He reports no further afib episodes. He feels well. He had a CPAP titration trial in June and the DME company has not been in contact with pt to set up his CPAP. Will message Dr. Theodosia Blender nurse to facilitate this. Continues on tikosyn. Qt is stable. No issues with  eliquis 5 mg bid for a CHA2DS2VASc score of 2.   F/u in afib clinic 10/21/20. He had a successful  cardioversion in the ER 3/27, often having afib for more than 24 hours. He woke up in a sweat and decided to go to ER. He has not had any afib since then. He does report an unprovoked episode of afib in February as well but spontaneously converted 4 hours later. He continues to  take Tikosyn without any interruption.  No bleeding issues with eliquis 5 mg bid for a CHA2DS2VASc score of at least 3. He rpeots that he was told borderline diabetic in February. He has lost 20 lbs and now walks 2 miles every day with subsequent  reduction of A 1C to less than  6.  Today, he denies symptoms of palpitations, chest pain, shortness of breath, orthopnea, PND, lower extremity edema, dizziness, presyncope, syncope, or neurologic sequela. The patient is tolerating medications without  difficulties and is otherwise without complaint today.   Past Medical History:  Diagnosis Date  . Arthritis   . Benign essential HTN 01/05/2018  . Blood transfusion    As an infant  . CKD (chronic kidney disease), stage III ([place])   . Dislocated IOL (intraocular lens), posterior 10/02/2011  . High cholesterol   . Hypertension   . Nonischemic cardiomyopathy ([place]) 04/19/2018  . Persistent atrial fibrillation ([place])   . Pneumonia ~ 1978   Past Surgical History:  Procedure Laterality Date  . ANTERIOR CERVICAL DISCECTOMY  2008  . APPENDECTOMY     "as an infant"  . CARDIOVERSION N/A 03/11/2018   Procedure: CARDIOVERSION;  Surgeon: [name], MD;  Location: [facility] ENDOSCOPY;  Service: Cardiovascular;  Laterality: N/A;  . CARDIOVERSION N/A 04/21/2018   Procedure: CARDIOVERSION;  Surgeon: [name], MD;  Location: [place];  Service: Cardiovascular;  Laterality: N/A;  . CATARACT EXTRACTION W/ INTRAOCULAR LENS IMPLANT  08/13/11   left  . CATARACT EXTRACTION W/ INTRAOCULAR LENS IMPLANT  09/03/11   right  . Colonscopy    . Compound Fractures  06/1980   right arm; repaired with 2 plates and 10 pins.  . FRACTURE SURGERY    . GAS/FLUID EXCHANGE  10/20/2011   Procedure: GAS/FLUID EXCHANGE;  Surgeon: Hayden Pedro, MD;  Location: [place] OR;  Service: Ophthalmology;  Laterality: Right;  . HERNIA REPAIR     "umbilical; as an infant"  . INTRAOCULAR LENS EXCHANGE  10/20/11   right eye  . PARS PLANA VITRECTOMY  10/20/2011   Procedure: PARS PLANA VITRECTOMY WITH 25G REMOVAL/SUTURE INTRAOCULAR LENS;  Surgeon: Hayden Pedro, MD;  Location: [place];  Service: Ophthalmology;  Laterality: Right;  . RIGHT/LEFT HEART CATH AND CORONARY ANGIOGRAPHY N/A 01/20/2018   Procedure: RIGHT/LEFT HEART CATH AND CORONARY ANGIOGRAPHY;  Surgeon: Belva Crome, MD;  Location: [facility] CV LAB;  Service: Cardiovascular;  Laterality: N/A;    Current Outpatient Medications  Medication Sig Dispense Refill  .  alclomethasone (ACLOVATE) 0.05 % cream Apply 1 application topically daily as needed (for rash). Forehead    . diltiazem (CARDIZEM) 30 MG tablet Take 1 tablet every 4 hours AS NEEDED for AF heart rate >100 as long as top BP >100. 30 tablet 1  . dofetilide (TIKOSYN) 250 MCG capsule TAKE 1 CAPSULE (250 MCG TOTAL) BY MOUTH 2 (TWO) TIMES DAILY. 180 capsule 1  . doxazosin (CARDURA) 8 MG tablet Take 8 mg by mouth every evening.     Marland Kitchen ELIQUIS 5 MG TABS tablet TAKE 1 TABLET BY MOUTH TWICE A DAY 60 tablet 5  . finasteride (PROSCAR) 5 MG tablet Take 5 mg by mouth daily.    . furosemide (LASIX) 20 MG tablet Take 1 tablet (20 mg total) by mouth daily. 90 tablet 3  . losartan (COZAAR) 100 MG tablet Take 1 tablet (100 mg total) by mouth daily. 90 tablet 3  . metoprolol succinate (TOPROL-XL) 25 MG 24 hr tablet TAKE 1/2 TABLET BY MOUTH EVERY DAY 45 tablet 2  . Multiple Vitamin (MULITIVITAMIN WITH MINERALS) TABS Take 1 tablet by mouth every evening.     . pravastatin (PRAVACHOL) 80 MG tablet Take 80 mg by mouth at bedtime.     . RESTASIS 0.05 % ophthalmic emulsion Place 1 drop into both eyes 2 (two) times daily.    Marland Kitchen EPINEPHrine (EPIPEN 2-PAK) 0.3 mg/0.3 mL IJ SOAJ injection      No current facility-administered medications for this encounter.    Allergies  Allergen Reactions  . Honey Bee Treatment [Bee Venom] Anaphylaxis, Hives and Swelling  . Wasp Venom Anaphylaxis, Hives and Swelling  . Neosporin [Neomycin-Bacitracin Zn-Polymyx] Rash    Social History   Socioeconomic History  . Marital status: Married    Spouse name: Not on file  . Number of children: Not on file  . Years of education: Not on file  . Highest education level: Not on file  Occupational History  . Not on file  Tobacco Use  . Smoking status: Never Smoker  . Smokeless tobacco: Former Systems developer    Types: Secondary school teacher  . Vaping Use: Never used  Substance and Sexual Activity  . Alcohol use: Yes    Alcohol/week: 1.0 standard drink     Types: 1 Cans of beer per week    Comment: out to eat  . Drug use: No  . Sexual activity: Not Currently  Other Topics Concern  . Not on file  Social History Narrative  . Not on file   Social Determinants of Health   Financial Resource Strain: Not on file  Food Insecurity: Not on file  Transportation Needs: Not on file  Physical Activity: Not on file  Stress: Not on file  Social Connections: Not on file  Intimate Partner Violence: Not on file  Family History  Problem Relation Age of Onset  . Lung cancer Mother   . Stomach cancer Mother   . Heart attack Father   . Anesthesia problems Neg Hx     ROS- All systems are reviewed and negative except as per the HPI above  Physical Exam: Vitals:   10/21/20 1024  BP: 132/76  Pulse: (!) 51  Weight: 88.5 kg  Height: 6' (1.829 m)   Wt Readings from Last 3 Encounters:  10/21/20 88.5 kg  10/06/20 87.5 kg  07/11/20 91.4 kg    Labs: Lab Results  Component Value Date   NA 137 10/06/2020   K 4.6 10/06/2020   CL 105 10/06/2020   CO2 26 10/06/2020   GLUCOSE 162 (H) 10/06/2020   BUN 17 10/06/2020   CREATININE 1.54 (H) 10/06/2020   CALCIUM 9.2 10/06/2020   MG 1.9 10/06/2020   No results found for: INR No results found for: CHOL, HDL, LDLCALC, TRIG   GEN- The patient is well appearing, alert and oriented x 3 today.   Head- normocephalic, atraumatic Eyes-  Sclera clear, conjunctiva pink Ears- hearing intact Oropharynx- clear Neck- supple, no JVP Lymph- no cervical lymphadenopathy Lungs- Clear to ausculation bilaterally, normal work of breathing Heart- Regular rate and rhythm, no murmurs, rubs or gallops, PMI not laterally displaced GI- soft, NT, ND, + BS Extremities- no clubbing, cyanosis, or edema MS- no significant deformity or atrophy Skin- no rash or lesion Psych- euthymic mood, full affect Neuro- strength and sensation are intact  EKG-Sinus brady at 51 bpm, PR int 154 ms, qrs int 76 ms, qtc 425 ms Epic  records reviewed Echo-2/20-IMPRESSIONS    1. The left ventricle has normal systolic function, with an ejection fraction of 55-60%. The cavity size was normal. There is mild concentric left ventricular hypertrophy. Left ventricular diastolic Doppler parameters are consistent with impaired  relaxation.  2. The right ventricle has normal systolic function. The cavity was normal. There is no increase in right ventricular wall thickness.  3. The mitral valve is normal in structure.  4. The tricuspid valve is normal in structure.  5. The aortic valve is tricuspid There is mild thickening and mild calcification of the aortic valve. Aortic valve regurgitation is trivial by color flow Doppler.  6. The pulmonic valve was normal in structure.  7. Right atrial pressure is estimated at 3 mmHg.  FINDINGS  Left Ventricle: The left ventricle has normal systolic function, with an ejection fraction of 55-60%. The cavity size was normal. There is mild concentric left ventricular hypertrophy. Left ventricular diastolic Doppler parameters are consistent with  impaired relaxation Right Ventricle: The right ventricle has normal systolic function. The cavity was normal. There is no increase in right ventricular wall thickness. Left Atrium: left atrial size was normal in size Right Atrium: right atrial size was normal in size Right atrial pressure is estimated at 3 mmHg. Interatrial Septum: No atrial level shunt detected by color flow Doppler. Pericardium: There is no evidence of pericardial effusion. Mitral Valve: The mitral valve is normal in structure. Mitral valve regurgitation is trivial by color flow Doppler. Tricuspid Valve: The tricuspid valve is normal in structure. Tricuspid valve regurgitation was not visualized by color flow Doppler. Aortic Valve: The aortic valve is tricuspid There is mild thickening and mild calcification of the aortic valve. Aortic valve regurgitation is trivial by color flow  Doppler. Pulmonic Valve: The pulmonic valve was normal in structure. Pulmonic valve regurgitation is not visualized by color flow Doppler.  Venous: The inferior vena cava measures 1.10 cm, is normal in size with greater than 50% respiratory variability.     Assessment and Plan: 1. Persistent  afib Has been doing very well maintaining SR on tikosyn except for in February 2021,  breakthrough with pt self converting in ER and then again in 10/06/20 requiring  ER CV Continue dofetilide 250 mcg bid Continue metoprolol er 12.5 mg daily Bmet/mag/cbc from ER reviewed and in NL  Using cpap  Pt would like to discuss with Dr. Rayann Heman ablation as he did commit that he would be a good candidate for one  if afib shows increased burden  I will also rx cardizem 30 mg to use prn with instructions how to use with breakthrough afib   2. CHA2DS2VASc score of 3 Continue apixaban 5 mg bid    3. HTN  Stable   4. CHA2DS2VASc score of at least 2 Continue eliquis 5 mg bid   5. OSA Using cpap  F/u  with Dr. Rayann Heman  as requested   Geroge Baseman. Tekela Garguilo, [facility] [address] [place], [place] [address] [phone]

## 2020-10-21 NOTE — Patient Instructions (Signed)
Cardizem 30mg  -- take 1 tablet every 4 hours AS NEEDED for AF heart rate >100 as long as top number of blood pressure >100.    Follow up with Dr. Rayann Heman in 6-8 weeks

## 2020-11-13 ENCOUNTER — Other Ambulatory Visit (HOSPITAL_COMMUNITY): Payer: Self-pay | Admitting: Nurse Practitioner

## 2020-11-19 ENCOUNTER — Telehealth (HOSPITAL_COMMUNITY): Payer: Self-pay | Admitting: *Deleted

## 2020-11-19 ENCOUNTER — Ambulatory Visit (HOSPITAL_COMMUNITY): Payer: Medicare Other | Admitting: Nurse Practitioner

## 2020-11-19 NOTE — Telephone Encounter (Signed)
Patient called in stating he is having intermittent dizziness for the last several days. When he has a dizzy spell his watch is reading his HR at 45-54. BP 137/75.  Discussed with Roderic Palau NP will stop metoprolol to see if improvement in dizziness. If AF returns use PRN cardizem. Pt in agreement. Seeing Dr. Rayann Heman in a few weeks to discuss possible repeat ablation.

## 2020-11-21 MED ORDER — METOPROLOL SUCCINATE ER 25 MG PO TB24: 0.5000 | ORAL_TABLET | Freq: Every day | ORAL | 2 refills | Status: DC

## 2020-11-21 NOTE — Telephone Encounter (Signed)
Pt called stating he went back into afib this morning he has taken 2 doses of diltiazem 30mg  without relief. He did go ahead and take the metoprolol 12.5mg  that was recently stopped for dizziness (which pt did say was improved off metoprolol) .  He will continue metoprolol 12.5mg  but move it to bedtime and let me know if still out of rhythm tomorrow. Pt in agreement.

## 2020-11-21 NOTE — Addendum Note (Signed)
Addended by: Juluis Mire on: 11/21/2020 10:16 AM   Modules accepted: Orders

## 2020-11-22 ENCOUNTER — Telehealth (HOSPITAL_COMMUNITY): Payer: Self-pay | Admitting: *Deleted

## 2020-11-22 NOTE — Telephone Encounter (Signed)
Pt continues in AF. HR in the 110s. BP has been on the softer side therefore unable to take PRN cardizem.  Discussed with Adline Peals PA will decrease losartan to 50mg  a day and increase metoprolol to 25mg  a day through weekend. Pt to touch base on Monday with update.

## 2020-11-25 ENCOUNTER — Other Ambulatory Visit (HOSPITAL_COMMUNITY): Payer: Self-pay | Admitting: Nurse Practitioner

## 2020-11-25 NOTE — Telephone Encounter (Signed)
Pt called with update. He converted to normal rhythm Friday afternoon. He is back on his normal medication regimen. Will call if issues.

## 2020-12-04 ENCOUNTER — Ambulatory Visit: Payer: Medicare Other | Admitting: Internal Medicine

## 2020-12-04 ENCOUNTER — Other Ambulatory Visit: Payer: Self-pay

## 2020-12-04 ENCOUNTER — Encounter: Payer: Self-pay | Admitting: Internal Medicine

## 2020-12-04 VITALS — BP 130/68 | HR 47 | Wt 195.6 lb

## 2020-12-04 DIAGNOSIS — I428 Other cardiomyopathies: Secondary | ICD-10-CM

## 2020-12-04 DIAGNOSIS — I10 Essential (primary) hypertension: Secondary | ICD-10-CM

## 2020-12-04 DIAGNOSIS — I4819 Other persistent atrial fibrillation: Secondary | ICD-10-CM | POA: Diagnosis not present

## 2020-12-04 DIAGNOSIS — G4733 Obstructive sleep apnea (adult) (pediatric): Secondary | ICD-10-CM | POA: Diagnosis not present

## 2020-12-04 NOTE — Patient Instructions (Addendum)
Medication Instructions:  Your physician recommends that you continue on your current medications as directed. Please refer to the Current Medication list given to you today.  Labwork: None ordered.  Testing/Procedures: None ordered.  Follow-Up: Your physician wants you to follow-up in: 03/12/21 at 9:45 am with Thompson Grayer, MD   Any Other Special Instructions Will Be Listed Below (If Applicable).  If you need a refill on your cardiac medications before your next appointment, please call your pharmacy.

## 2020-12-04 NOTE — Progress Notes (Signed)
PCP: [facility] [place] [facility] Cardiologist: Dr Radford Pax Primary EP: Dr Rayann Heman  John Cobb is a 75 y.o. male who presents today for routine electrophysiology followup.  Since last being seen in our clinic, the patient reports doing reasonably well.  He had repeat AF and required cardioversion 3/27.  He is exercising and has lost 20 lbs.  He has had several episodes of afib since that time.  Today, he denies symptoms of palpitations, chest pain, shortness of breath,  lower extremity edema, dizziness, presyncope, or syncope.  The patient is otherwise without complaint today.   Past Medical History:  Diagnosis Date  . Arthritis   . Benign essential HTN 01/05/2018  . Blood transfusion    As an infant  . CKD (chronic kidney disease), stage III ([place])   . Dislocated IOL (intraocular lens), posterior 10/02/2011  . High cholesterol   . Hypertension   . Nonischemic cardiomyopathy ([place]) 04/19/2018  . Persistent atrial fibrillation ([place])   . Pneumonia ~ 1978   Past Surgical History:  Procedure Laterality Date  . ANTERIOR CERVICAL DISCECTOMY  2008  . APPENDECTOMY     "as an infant"  . CARDIOVERSION N/A 03/11/2018   Procedure: CARDIOVERSION;  Surgeon: [name], MD;  Location: [facility] ENDOSCOPY;  Service: Cardiovascular;  Laterality: N/A;  . CARDIOVERSION N/A 04/21/2018   Procedure: CARDIOVERSION;  Surgeon: [name], MD;  Location: [place];  Service: Cardiovascular;  Laterality: N/A;  . CATARACT EXTRACTION W/ INTRAOCULAR LENS IMPLANT  08/13/11   left  . CATARACT EXTRACTION W/ INTRAOCULAR LENS IMPLANT  09/03/11   right  . Colonscopy    . Compound Fractures  06/1980   right arm; repaired with 2 plates and 10 pins.  . FRACTURE SURGERY    . GAS/FLUID EXCHANGE  10/20/2011   Procedure: GAS/FLUID EXCHANGE;  Surgeon: Hayden Pedro, MD;  Location: [place];  Service: Ophthalmology;  Laterality: Right;  . HERNIA REPAIR     "umbilical; as an infant"  .  INTRAOCULAR LENS EXCHANGE  10/20/11   right eye  . PARS PLANA VITRECTOMY  10/20/2011   Procedure: PARS PLANA VITRECTOMY WITH 25G REMOVAL/SUTURE INTRAOCULAR LENS;  Surgeon: Hayden Pedro, MD;  Location: [place];  Service: Ophthalmology;  Laterality: Right;  . RIGHT/LEFT HEART CATH AND CORONARY ANGIOGRAPHY N/A 01/20/2018   Procedure: RIGHT/LEFT HEART CATH AND CORONARY ANGIOGRAPHY;  Surgeon: Belva Crome, MD;  Location: [place] CV LAB;  Service: Cardiovascular;  Laterality: N/A;    ROS- all systems are reviewed and negatives except as per HPI above  Current Outpatient Medications  Medication Sig Dispense Refill  . alclomethasone (ACLOVATE) 0.05 % cream Apply 1 application topically daily as needed (for rash). Forehead    . diltiazem (CARDIZEM) 30 MG tablet TAKE 1 TABLET EVERY 4 HOURS AS NEEDED FOR AF HEART RATE >100 AS LONG AS TOP BP >100. 30 tablet 1  . dofetilide (TIKOSYN) 250 MCG capsule TAKE 1 CAPSULE (250 MCG TOTAL) BY MOUTH 2 (TWO) TIMES DAILY. 180 capsule 1  . doxazosin (CARDURA) 8 MG tablet Take 8 mg by mouth every evening.     Marland Kitchen ELIQUIS 5 MG TABS tablet TAKE 1 TABLET BY MOUTH TWICE A DAY 60 tablet 5  . EPINEPHrine 0.3 mg/0.3 mL IJ SOAJ injection     . finasteride (PROSCAR) 5 MG tablet Take 5 mg by mouth daily.    . furosemide (LASIX) 20 MG tablet Take 1 tablet (20 mg total) by mouth daily. 90 tablet  3  . metoprolol succinate (TOPROL-XL) 25 MG 24 hr tablet Take 0.5 tablets (12.5 mg total) by mouth daily. 45 tablet 2  . Multiple Vitamin (MULITIVITAMIN WITH MINERALS) TABS Take 1 tablet by mouth every evening.     . pravastatin (PRAVACHOL) 80 MG tablet Take 80 mg by mouth at bedtime.     . RESTASIS 0.05 % ophthalmic emulsion Place 1 drop into both eyes 2 (two) times daily.    Marland Kitchen losartan (COZAAR) 100 MG tablet Take 1 tablet (100 mg total) by mouth daily. 90 tablet 3   No current facility-administered medications for this visit.    Physical Exam: Vitals:   12/04/20 1622  BP: 130/68   Pulse: (!) 47  SpO2: 97%  Weight: 195 lb 9.6 oz (88.7 kg)    GEN- The patient is well appearing, alert and oriented x 3 today.   Head- normocephalic, atraumatic Eyes-  Sclera clear, conjunctiva pink Ears- hearing intact Oropharynx- clear Lungs- Clear to ausculation bilaterally, normal work of breathing Heart- Regular rate and rhythm, no murmurs, rubs or gallops, PMI not laterally displaced GI- soft, NT, ND, + BS Extremities- no clubbing, cyanosis, or edema  Wt Readings from Last 3 Encounters:  12/04/20 195 lb 9.6 oz (88.7 kg)  10/21/20 195 lb (88.5 kg)  10/06/20 193 lb (87.5 kg)    EKG tracing ordered today is personally reviewed and shows sinus bradycardia 47 bpm, PR 158 msec, QTc 400 msec  Assessment and Plan:  1. Persistent afib The patient has symptomatic, recurrent  atrial fibrillation. he has failed medical therapy with tikosyn.  We will need to follow him closely on this medicine to avoid toxicity. Chads2vasc score is 3.  he is anticoagulated with eliquis . Therapeutic strategies for afib including medicine and ablation were discussed in detail with the patient today. Risk, benefits, and alternatives to EP study and radiofrequency ablation for afib were also discussed in detail today. These risks include but are not limited to stroke, bleeding, vascular damage, tamponade, perforation, damage to the esophagus, lungs, and other structures, pulmonary vein stenosis, worsening renal function, and death. The patient understands these risk and wishes to hold off on ablation right now.  He wishes to revisit this in the fall.  2. HTN Stable No change required today  3. OSA Severe OSA Compliance with CPAP advised  4. Prior tachycardia induced CM Resolved with sinus  Risks, benefits and potential toxicities for medications prescribed and/or refilled reviewed with patient today.  Return to see me in 3 months   Thompson Grayer MD, [facility] 12/04/2020 4:34 PM

## 2021-01-10 ENCOUNTER — Other Ambulatory Visit: Payer: Self-pay | Admitting: Internal Medicine

## 2021-02-11 ENCOUNTER — Other Ambulatory Visit: Payer: Self-pay | Admitting: Internal Medicine

## 2021-02-11 NOTE — Telephone Encounter (Signed)
Prescription refill request for Eliquis received.  Indication: afib  Last office visit: allred, 12/04/2020 Scr: 1.54, 10/06/2020 Age: 75 yo  Weight: 88.7 kg   Refill sent.

## 2021-02-12 ENCOUNTER — Other Ambulatory Visit: Payer: Self-pay | Admitting: Cardiology

## 2021-03-12 ENCOUNTER — Other Ambulatory Visit: Payer: Self-pay

## 2021-03-12 ENCOUNTER — Encounter: Payer: Self-pay | Admitting: *Deleted

## 2021-03-12 ENCOUNTER — Ambulatory Visit: Payer: Medicare Other | Admitting: Internal Medicine

## 2021-03-12 VITALS — BP 112/76 | HR 132 | Ht 72.0 in | Wt 188.8 lb

## 2021-03-12 DIAGNOSIS — I428 Other cardiomyopathies: Secondary | ICD-10-CM | POA: Diagnosis not present

## 2021-03-12 DIAGNOSIS — I4819 Other persistent atrial fibrillation: Secondary | ICD-10-CM

## 2021-03-12 DIAGNOSIS — G4733 Obstructive sleep apnea (adult) (pediatric): Secondary | ICD-10-CM

## 2021-03-12 DIAGNOSIS — I10 Essential (primary) hypertension: Secondary | ICD-10-CM

## 2021-03-12 MED ORDER — METOPROLOL TARTRATE 25 MG PO TABS: 25.0000 mg | ORAL_TABLET | Freq: Once | ORAL | 0 refills | Status: DC | PRN

## 2021-03-12 NOTE — Progress Notes (Signed)
PCP: [facility] Cardiologist: Dr Radford Pax [facility] EP: Dr Rayann Heman  John Cobb is a 75 y.o. male who presents today for routine electrophysiology followup.  Since last being seen in our clinic, the patient has had worsening afib.  This began in July when his wife entered the hospital following back surgery.  He says she has had a number of complications and [facility]  He has been in afib now for 2 days.  + palpitations and fatigue.  Today, he denies symptoms of   chest pain, shortness of breath,  lower extremity edema, dizziness, presyncope, or syncope.  The patient is otherwise without complaint today.   Past Medical History:  Diagnosis Date   Arthritis    Benign essential HTN 01/05/2018   Blood transfusion    As an infant   CKD (chronic kidney disease), stage III ([place])    Dislocated IOL (intraocular lens), posterior 10/02/2011   High cholesterol    Hypertension    Nonischemic cardiomyopathy ([place]) 04/19/2018   Persistent atrial fibrillation ([place])    Pneumonia ~ 1978   Past Surgical History:  Procedure Laterality Date   ANTERIOR CERVICAL DISCECTOMY  2008   APPENDECTOMY     "as an infant"   CARDIOVERSION N/A 03/11/2018   Procedure: CARDIOVERSION;  Surgeon: [name], MD;  Location: [place];  Service: Cardiovascular;  Laterality: N/A;   CARDIOVERSION N/A 04/21/2018   Procedure: CARDIOVERSION;  Surgeon: [name], MD;  Location: [facility];  Service: Cardiovascular;  Laterality: N/A;   CATARACT EXTRACTION W/ INTRAOCULAR LENS IMPLANT  08/13/11   left   CATARACT EXTRACTION W/ INTRAOCULAR LENS IMPLANT  09/03/11   right   Colonscopy     Compound Fractures  06/1980   right arm; repaired with 2 plates and 10 pins.   FRACTURE SURGERY     GAS/FLUID EXCHANGE  10/20/2011   Procedure: GAS/FLUID EXCHANGE;  Surgeon: Hayden Pedro, MD;  Location: [facility];  Service: Ophthalmology;  Laterality: Right;    HERNIA REPAIR     "umbilical; as an infant"   INTRAOCULAR LENS EXCHANGE  10/20/11   right eye   PARS PLANA VITRECTOMY  10/20/2011   Procedure: PARS PLANA VITRECTOMY WITH 25G REMOVAL/SUTURE INTRAOCULAR LENS;  Surgeon: Hayden Pedro, MD;  Location: [facility];  Service: Ophthalmology;  Laterality: Right;   RIGHT/LEFT HEART CATH AND CORONARY ANGIOGRAPHY N/A 01/20/2018   Procedure: RIGHT/LEFT HEART CATH AND CORONARY ANGIOGRAPHY;  Surgeon: Belva Crome, MD;  Location: [facility];  Service: Cardiovascular;  Laterality: N/A;    ROS- all systems are reviewed and negatives except as per HPI above  Current Outpatient Medications  Medication Sig Dispense Refill   alclomethasone (ACLOVATE) 0.05 % cream Apply 1 application topically daily as needed (for rash). Forehead     diltiazem (CARDIZEM) 30 MG tablet TAKE 1 TABLET EVERY 4 HOURS AS NEEDED FOR AF HEART RATE >100 AS LONG AS TOP BP >100. 30 tablet 1   dofetilide (TIKOSYN) 250 MCG capsule TAKE 1 CAPSULE TWICE DAILY 180 capsule 3   doxazosin (CARDURA) 8 MG tablet Take 8 mg by mouth every evening.      ELIQUIS 5 MG TABS tablet TAKE 1 TABLET BY MOUTH TWICE A DAY 60 tablet 5   EPINEPHrine 0.3 mg/0.3 mL IJ SOAJ injection      finasteride (PROSCAR) 5 MG tablet Take 5 mg by mouth daily.     furosemide (LASIX) 20 MG tablet  Take 1 tablet (20 mg total) by mouth daily. 90 tablet 3   metoprolol succinate (TOPROL-XL) 25 MG 24 hr tablet TAKE 1/2 TABLET BY MOUTH EVERY DAY 45 tablet 2   Multiple Vitamin (MULITIVITAMIN WITH MINERALS) TABS Take 1 tablet by mouth every evening.      pravastatin (PRAVACHOL) 80 MG tablet Take 80 mg by mouth at bedtime.      tamsulosin (FLOMAX) 0.4 MG CAPS capsule Take 0.4 mg by mouth daily.     losartan (COZAAR) 100 MG tablet Take 1 tablet (100 mg total) by mouth daily. 90 tablet 3   No current facility-administered medications for this visit.    Physical Exam: Vitals:   03/12/21 0939  BP: 112/76  Pulse: (!) 132  SpO2: 98%   Weight: 188 lb 12.8 oz (85.6 kg)  Height: 6' (1.829 m)    GEN- The patient is well appearing, alert and oriented x 3 today.   Head- normocephalic, atraumatic Eyes-  Sclera clear, conjunctiva pink Ears- hearing intact Oropharynx- clear Lungs- Clear to ausculation bilaterally, normal work of breathing Heart- Regular rate and rhythm, no murmurs, rubs or gallops, PMI not laterally displaced GI- soft, NT, ND, + BS Extremities- no clubbing, cyanosis, or edema  Wt Readings from Last 3 Encounters:  03/12/21 188 lb 12.8 oz (85.6 kg)  12/04/20 195 lb 9.6 oz (88.7 kg)  10/21/20 195 lb (88.5 kg)    EKG tracing ordered today is personally reviewed and shows afib with RVR  Assessment and Plan:  Persistent afib The patient has symptomatic, recurrent  atrial fibrillation. he has failed medical therapy with tikosyn. Chads2vasc score is 3.  he is anticoagulated with eliquis . Therapeutic strategies for afib including medicine and ablation were discussed in detail with the patient today. Risk, benefits, and alternatives to EP study and radiofrequency ablation for afib were also discussed in detail today. These risks include but are not limited to stroke, bleeding, vascular damage, tamponade, perforation, damage to the esophagus, lungs, and other structures, pulmonary vein stenosis, worsening renal function, and death. The patient understands these risk and wishes to proceed.  We will therefore proceed with catheter ablation at the next available time.  Carto, ICE, anesthesia are requested for the procedure.  Will also obtain cardiac CT prior to the procedure to exclude LAA thrombus and further evaluate atrial anatomy.  If he does not convert to sinus in the next couple days, he is instructed to call the AF clinic to arrange cardioversion.  2. HTN Stable  No change required today  3. OSA Severe OSA Compliance with CPAP advised  4. Prior tachycardia mediated CM Previously recovered however I  worry about return with elevated RVR ongoing.  Risks, benefits and potential toxicities for medications prescribed and/or refilled reviewed with patient today.   Thompson Grayer MD, [facility] 03/12/2021 10:01 AM

## 2021-03-12 NOTE — Patient Instructions (Addendum)
Medication Instructions:  Your physician recommends that you continue on your current medications as directed. Please refer to the Current Medication list given to you today.  Labwork: None ordered.  Testing/Procedures: Your physician has requested that you have cardiac CT. Cardiac computed tomography (CT) is a painless test that uses an x-ray machine to take clear, detailed pictures of your heart. For further information please visit [facility]. Please follow instruction sheet as given.   Your physician has recommended that you have an ablation. Catheter ablation is a medical procedure used to treat some cardiac arrhythmias (irregular heartbeats). During catheter ablation, a long, thin, flexible tube is put into a blood vessel in your groin (upper thigh), or neck. This tube is called an ablation catheter. It is then guided to your heart through the blood vessel. Radio frequency waves destroy small areas of heart tissue where abnormal heartbeats may cause an arrhythmia to start. Please see the instruction sheet given to you today.    Any Other Special Instructions Will Be Listed Below (If Applicable).  If you need a refill on your cardiac medications before your next appointment, please call your pharmacy.     Cardiac Ablation Cardiac ablation is a procedure to destroy (ablate) some heart tissue that is sending bad signals. These bad signals cause problems in heart rhythm. The heart has many areas that make these signals. If there are problems in these areas, they can make the heart beat in a way that is not normal. Destroying some tissues can help make the heart rhythm normal. Tell your doctor about: Any allergies you have. All medicines you are taking. These include vitamins, herbs, eye drops, creams, and over-the-counter medicines. Any problems you or family members have had with medicines that make you fall asleep (anesthetics). Any blood disorders you have. Any surgeries you  have had. Any medical conditions you have, such as kidney failure. Whether you are pregnant or may be pregnant. What are the risks? This is a safe procedure. But problems may occur, including: Infection. Bruising and bleeding. Bleeding into the chest. Stroke or blood clots. Damage to nearby areas of your body. Allergies to medicines or dyes. The need for a pacemaker if the normal system is damaged. Failure of the procedure to treat the problem. What happens before the procedure? Medicines Ask your doctor about: Changing or stopping your normal medicines. This is important. Taking aspirin and ibuprofen. Do not take these medicines unless your doctor tells you to take them. Taking other medicines, vitamins, herbs, and supplements. General instructions Follow instructions from your doctor about what you cannot eat or drink. Plan to have someone take you home from the hospital or clinic. If you will be going home right after the procedure, plan to have someone with you for 24 hours. Ask your doctor what steps will be taken to prevent infection. What happens during the procedure?  An IV tube will be put into one of your veins. You will be given a medicine to help you relax. The skin on your neck or groin will be numbed. A cut (incision) will be made in your neck or groin. A needle will be put through your cut and into a large vein. A tube (catheter) will be put into the needle. The tube will be moved to your heart. Dye may be put through the tube. This helps your doctor see your heart. Small devices (electrodes) on the tube will send out signals. A type of energy will be used to destroy some  heart tissue. The tube will be taken out. Pressure will be held on your cut. This helps stop bleeding. A bandage will be put over your cut. The exact procedure may vary among doctors and hospitals. What happens after the procedure? You will be watched until you leave the hospital or clinic. This  includes checking your heart rate, breathing rate, oxygen, and blood pressure. Your cut will be watched for bleeding. You will need to lie still for a few hours. Do not drive for 24 hours or as long as your doctor tells you. Summary Cardiac ablation is a procedure to destroy some heart tissue. This is done to treat heart rhythm problems. Tell your doctor about any medical conditions you may have. Tell him or her about all medicines you are taking to treat them. This is a safe procedure. But problems may occur. These include infection, bruising, bleeding, and damage to nearby areas of your body. Follow what your doctor tells you about food and drink. You may also be told to change or stop some of your medicines. After the procedure, do not drive for 24 hours or as long as your doctor tells you. This information is not intended to replace advice given to you by your health care provider. Make sure you discuss any questions you have with your health care provider. Document Revised: 06/01/2019 Document Reviewed: 06/01/2019 Elsevier Patient Education  2022 [employer].

## 2021-04-21 ENCOUNTER — Other Ambulatory Visit: Payer: Self-pay

## 2021-04-21 ENCOUNTER — Ambulatory Visit: Payer: Medicare Other | Admitting: Podiatry

## 2021-04-21 ENCOUNTER — Encounter: Payer: Self-pay | Admitting: Podiatry

## 2021-04-21 ENCOUNTER — Ambulatory Visit (INDEPENDENT_AMBULATORY_CARE_PROVIDER_SITE_OTHER): Payer: Medicare Other

## 2021-04-21 DIAGNOSIS — R2681 Unsteadiness on feet: Secondary | ICD-10-CM

## 2021-04-21 DIAGNOSIS — J301 Allergic rhinitis due to pollen: Secondary | ICD-10-CM | POA: Insufficient documentation

## 2021-04-21 DIAGNOSIS — L600 Ingrowing nail: Secondary | ICD-10-CM | POA: Diagnosis not present

## 2021-04-21 DIAGNOSIS — T63441D Toxic effect of venom of bees, accidental (unintentional), subsequent encounter: Secondary | ICD-10-CM | POA: Insufficient documentation

## 2021-04-21 DIAGNOSIS — M79671 Pain in right foot: Secondary | ICD-10-CM

## 2021-04-21 NOTE — Patient Instructions (Signed)

## 2021-04-21 NOTE — Progress Notes (Signed)
Subjective:   Patient ID: John Cobb, male   DOB: 75 y.o.   MRN: [id number]   HPI Patient presents stating he has had some balance instability issues on his right foot and has had several close calls with falls and feels unstable and has a very painful second nail that is thickened and digging into the corner along with lesions underneath the right foot which become painful.  States its been this way for around 6 months and gradually becoming more of an issue and patient does not smoke likes to be active   Review of Systems  All other systems reviewed and are negative.      Objective:  Physical Exam Vitals and nursing note reviewed.  Constitutional:      Appearance: He is well-developed.  Pulmonary:     Effort: Pulmonary effort is normal.  Musculoskeletal:        General: Normal range of motion.  Skin:    General: Skin is warm.  Neurological:     Mental Status: He is alert.    Neurovascular status intact muscle strength found to be adequate range of motion adequate.  Patient is noted to have a moderate instability in gait process right with no indications of tendon dysfunction or or foot drop.  Patient is found to have a thickened incurvated second nail right that is painful and keratotic lesions x2 plantar aspect right foot.  Good digital perfusion well oriented x3     Assessment:  Damaged right second nail with pain dorsal right foot and lesion formation right along with gait instability     Plan:  H&P reviewed all conditions and do think eventually it is possible that this patient may need some form of brace therapy to try to support the arch.  Today I went ahead recommended nail removal and I did went ahead and I anesthetized 60 mg like Marcaine mixture sterile prep done removed the second nail was sterile instrumentation exposed matrix applied phenol for applications 30 seconds followed by alcohol lavage sterile dressing gave instructions on soaks discussed possible  balance bracing and will see back 6 weeks and see how he does with having this nail taken off and I trimmed the calluses with no iatrogenic bleeding and we can reevaluate  X-rays indicate there is some rotational component of the digit with distal irritation of the second digit right with bone exposed

## 2021-04-23 ENCOUNTER — Other Ambulatory Visit: Payer: Self-pay | Admitting: Podiatry

## 2021-04-23 DIAGNOSIS — R2681 Unsteadiness on feet: Secondary | ICD-10-CM

## 2021-04-25 ENCOUNTER — Other Ambulatory Visit: Payer: Medicare Other | Admitting: *Deleted

## 2021-04-25 ENCOUNTER — Other Ambulatory Visit: Payer: Self-pay

## 2021-04-25 DIAGNOSIS — I428 Other cardiomyopathies: Secondary | ICD-10-CM

## 2021-04-25 DIAGNOSIS — I4819 Other persistent atrial fibrillation: Secondary | ICD-10-CM

## 2021-04-25 DIAGNOSIS — I10 Essential (primary) hypertension: Secondary | ICD-10-CM

## 2021-04-25 DIAGNOSIS — G4733 Obstructive sleep apnea (adult) (pediatric): Secondary | ICD-10-CM

## 2021-04-26 ENCOUNTER — Other Ambulatory Visit: Payer: Self-pay | Admitting: Internal Medicine

## 2021-04-26 LAB — CBC WITH DIFFERENTIAL/PLATELET
Basophils Absolute: 0.1 10*3/uL (ref 0.0–0.2)
Basos: 1 %
EOS (ABSOLUTE): 0.4 10*3/uL (ref 0.0–0.4)
Eos: 5 %
Hematocrit: 42.4 % (ref 37.5–51.0)
Hemoglobin: 14.4 g/dL (ref 13.0–17.7)
Immature Grans (Abs): 0 10*3/uL (ref 0.0–0.1)
Immature Granulocytes: 0 %
Lymphocytes Absolute: 1.6 10*3/uL (ref 0.7–3.1)
Lymphs: 19 %
MCH: 30.2 pg (ref 26.6–33.0)
MCHC: 34 g/dL (ref 31.5–35.7)
MCV: 89 fL (ref 79–97)
Monocytes Absolute: 0.8 10*3/uL (ref 0.1–0.9)
Monocytes: 10 %
Neutrophils Absolute: 5.4 10*3/uL (ref 1.4–7.0)
Neutrophils: 65 %
Platelets: 195 10*3/uL (ref 150–450)
RBC: 4.77 x10E6/uL (ref 4.14–5.80)
RDW: 13 % (ref 11.6–15.4)
WBC: 8.3 10*3/uL (ref 3.4–10.8)

## 2021-04-26 LAB — BASIC METABOLIC PANEL
BUN/Creatinine Ratio: 15 (ref 10–24)
BUN: 21 mg/dL (ref 8–27)
CO2: 25 mmol/L (ref 20–29)
Calcium: 9.5 mg/dL (ref 8.6–10.2)
Chloride: 103 mmol/L (ref 96–106)
Creatinine, Ser: 1.4 mg/dL — ABNORMAL HIGH (ref 0.76–1.27)
Glucose: 118 mg/dL — ABNORMAL HIGH (ref 70–99)
Potassium: 4.3 mmol/L (ref 3.5–5.2)
Sodium: 143 mmol/L (ref 134–144)
eGFR: 53 mL/min/{1.73_m2} — ABNORMAL LOW (ref >59)

## 2021-05-07 ENCOUNTER — Telehealth (HOSPITAL_COMMUNITY): Payer: Self-pay | Admitting: *Deleted

## 2021-05-07 NOTE — Telephone Encounter (Signed)
Reaching out to patient to offer assistance regarding upcoming cardiac imaging study; pt verbalizes understanding of appt date/time, parking situation and where to check in, pre-test NPO status and medications ordered, and verified current allergies; name and call back number provided for further questions should they arise  Wilmetta Speiser RN Navigator Cardiac Imaging  Heart and Vascular [phone] office [phone] cell  

## 2021-05-08 ENCOUNTER — Ambulatory Visit (HOSPITAL_COMMUNITY)
Admission: RE | Admit: 2021-05-08 | Discharge: 2021-05-08 | Disposition: A | Discharge status: Home / Self Care | Payer: Medicare Other | Source: Ambulatory Visit | Attending: Internal Medicine | Admitting: Internal Medicine

## 2021-05-08 ENCOUNTER — Other Ambulatory Visit: Payer: Self-pay

## 2021-05-08 DIAGNOSIS — I4819 Other persistent atrial fibrillation: Secondary | ICD-10-CM | POA: Insufficient documentation

## 2021-05-08 MED ORDER — IOHEXOL 350 MG/ML SOLN
95.0000 mL | Freq: Once | INTRAVENOUS | Status: AC | PRN
  Administered 2021-05-08: 95 mL via INTRAVENOUS

## 2021-05-12 ENCOUNTER — Telehealth: Payer: Self-pay | Admitting: Internal Medicine

## 2021-05-12 ENCOUNTER — Encounter: Payer: Self-pay | Admitting: Radiology

## 2021-05-12 DIAGNOSIS — I4819 Other persistent atrial fibrillation: Secondary | ICD-10-CM

## 2021-05-12 DIAGNOSIS — Z01818 Encounter for other preprocedural examination: Secondary | ICD-10-CM

## 2021-05-12 NOTE — Telephone Encounter (Signed)
Patient states he is breaking out all over his chest and arms. Denies having any recent medication changes or vaccines. Patient would like to know if Dr. Rayann Heman has any recommendations. Please advise.

## 2021-05-12 NOTE — Telephone Encounter (Signed)
Advise the patient to contact PCP for his rash.  Verbalized understanding.

## 2021-05-14 NOTE — Pre-Procedure Instructions (Signed)
Called patient regarding procedure instructions.  Patient states has a rash on his body.  He said it started on Friday on shoulders and has now spread down arms, chest and legs.  He called his PCP they ordered some cream that he hasn't picked up yet.  I spoke with Dr Rayann Heman, he states if patient wants to reschedule he could or he could come in and he would look at rash and decide then.  The patient decided he would like to reschedule.  Notified office.  Patient stated he had a CT scan with contrast on Thursday, ? Possible dye reaction.

## 2021-05-15 ENCOUNTER — Ambulatory Visit (HOSPITAL_COMMUNITY): Admission: RE | Admit: 2021-05-15 | Payer: Medicare Other | Source: Home / Self Care | Admitting: Internal Medicine

## 2021-05-15 ENCOUNTER — Encounter (HOSPITAL_COMMUNITY): Admission: RE | Payer: Self-pay | Source: Home / Self Care

## 2021-05-15 SURGERY — ATRIAL FIBRILLATION ABLATION
Anesthesia: General

## 2021-05-20 ENCOUNTER — Encounter: Payer: Self-pay | Admitting: *Deleted

## 2021-05-20 NOTE — Addendum Note (Signed)
Addended by: Darrell Jewel on: 05/20/2021 02:39 PM   Modules accepted: Orders

## 2021-05-20 NOTE — Telephone Encounter (Signed)
Procedure was canceled on 05/14/21 related to rash per patient request.   Called to reschedule ablation and lab work. Ablation date Dec 15 arrival time 11:30 am and labs Nov 23.  Patient verbalized agreement and understanding.

## 2021-05-28 ENCOUNTER — Telehealth (HOSPITAL_COMMUNITY): Payer: Self-pay | Admitting: *Deleted
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
## 2021-05-30 ENCOUNTER — Ambulatory Visit (HOSPITAL_COMMUNITY)
Admission: RE | Admit: 2021-05-30 | Discharge: 2021-05-30 | Disposition: A | Discharge status: Home / Self Care | Payer: Medicare Other | Source: Ambulatory Visit | Attending: Physician Assistant | Admitting: Physician Assistant

## 2021-05-30 ENCOUNTER — Other Ambulatory Visit (HOSPITAL_COMMUNITY): Payer: Self-pay | Admitting: *Deleted

## 2021-05-30 ENCOUNTER — Other Ambulatory Visit: Payer: Self-pay

## 2021-05-30 VITALS — BP 126/86 | HR 119 | Ht 72.0 in | Wt 199.6 lb

## 2021-05-30 DIAGNOSIS — I10 Essential (primary) hypertension: Secondary | ICD-10-CM | POA: Diagnosis not present

## 2021-05-30 DIAGNOSIS — G4733 Obstructive sleep apnea (adult) (pediatric): Secondary | ICD-10-CM | POA: Diagnosis not present

## 2021-05-30 DIAGNOSIS — I959 Hypotension, unspecified: Secondary | ICD-10-CM | POA: Insufficient documentation

## 2021-05-30 DIAGNOSIS — D6869 Other thrombophilia: Secondary | ICD-10-CM | POA: Diagnosis not present

## 2021-05-30 DIAGNOSIS — I4819 Other persistent atrial fibrillation: Secondary | ICD-10-CM | POA: Diagnosis not present

## 2021-05-30 DIAGNOSIS — Z7901 Long term (current) use of anticoagulants: Secondary | ICD-10-CM | POA: Insufficient documentation

## 2021-05-30 DIAGNOSIS — I428 Other cardiomyopathies: Secondary | ICD-10-CM | POA: Diagnosis not present

## 2021-05-30 DIAGNOSIS — Z9989 Dependence on other enabling machines and devices: Secondary | ICD-10-CM | POA: Insufficient documentation

## 2021-05-30 DIAGNOSIS — Z79899 Other long term (current) drug therapy: Secondary | ICD-10-CM | POA: Diagnosis not present

## 2021-05-30 LAB — BASIC METABOLIC PANEL
Anion gap: 4 — ABNORMAL LOW (ref 5–15)
BUN: 31 mg/dL — ABNORMAL HIGH (ref 8–23)
CO2: 30 mmol/L (ref 22–32)
Calcium: 9 mg/dL (ref 8.9–10.3)
Chloride: 102 mmol/L (ref 98–111)
Creatinine, Ser: 1.51 mg/dL — ABNORMAL HIGH (ref 0.61–1.24)
GFR, Estimated: 48 mL/min — ABNORMAL LOW (ref >60)
Glucose, Bld: 133 mg/dL — ABNORMAL HIGH (ref 70–99)
Potassium: 4.6 mmol/L (ref 3.5–5.1)
Sodium: 136 mmol/L (ref 135–145)

## 2021-05-30 LAB — CBC
HCT: 44.3 % (ref 39.0–52.0)
Hemoglobin: 14.5 g/dL (ref 13.0–17.0)
MCH: 29.5 pg (ref 26.0–34.0)
MCHC: 32.7 g/dL (ref 30.0–36.0)
MCV: 90 fL (ref 80.0–100.0)
Platelets: 180 10*3/uL (ref 150–400)
RBC: 4.92 MIL/uL (ref 4.22–5.81)
RDW: 14.4 % (ref 11.5–15.5)
WBC: 9.5 10*3/uL (ref 4.0–10.5)
nRBC: 0 % (ref 0.0–0.2)

## 2021-05-30 LAB — MAGNESIUM: Magnesium: 2 mg/dL (ref 1.7–2.4)

## 2021-05-30 MED ORDER — METOPROLOL SUCCINATE ER 25 MG PO TB24: 12.5000 mg | ORAL_TABLET | Freq: Every day | ORAL | 2 refills | Status: AC

## 2021-05-30 MED ORDER — DILTIAZEM HCL ER COATED BEADS 120 MG PO CP24: 120.0000 mg | ORAL_CAPSULE | Freq: Every day | ORAL | 3 refills | Status: DC

## 2021-05-30 NOTE — Patient Instructions (Signed)
Continue metoprolol at 25mg  once a day until cardioversion then reduce back to 1/2 tablet daily   Start Cardizem 120mg  once a day until day of cardioversion then stop  Cardioversion scheduled for Wednesday, November 30th  - Arrive at the [facility] and go to admitting at 930AM  - Do not eat or drink anything after midnight the night prior to your procedure.  - Take all your morning medication (except diabetic medications) with a sip of water prior to arrival.  - You will not be able to drive home after your procedure.  - Do NOT miss any doses of your blood thinner - if you should miss a dose please notify our office immediately.  - If you feel as if you go back into normal rhythm prior to scheduled cardioversion, please notify our office immediately. If your procedure is canceled in the cardioversion suite you will be charged a cancellation fee.  Patients will be asked to: to mask in public and hand hygiene (no longer quarantine) in the 3 days prior to surgery, to report if any COVID-19-like illness or household contacts to COVID-19 to determine need for testing
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
## 2021-06-02 ENCOUNTER — Ambulatory Visit (HOSPITAL_COMMUNITY)
Admission: RE | Admit: 2021-06-02 | Discharge: 2021-06-02 | Disposition: A | Discharge status: Home / Self Care | Payer: Medicare Other | Source: Ambulatory Visit | Attending: Nurse Practitioner | Admitting: Nurse Practitioner

## 2021-06-02 ENCOUNTER — Encounter (HOSPITAL_COMMUNITY): Payer: Self-pay | Admitting: Nurse Practitioner

## 2021-06-02 DIAGNOSIS — R001 Bradycardia, unspecified: Secondary | ICD-10-CM | POA: Diagnosis present

## 2021-06-02 DIAGNOSIS — I4819 Other persistent atrial fibrillation: Secondary | ICD-10-CM

## 2021-06-02 MED ORDER — DILTIAZEM HCL ER COATED BEADS 120 MG PO CP24: 120.0000 mg | ORAL_CAPSULE | Freq: Every day | ORAL | 3 refills | Status: DC | PRN

## 2021-06-02 NOTE — Progress Notes (Signed)
Pt in for EKG after noting return to SR Sunday. EKG shows Sinus brady at 55 bpm. His cardioversion scheduled for tomorrow will be cancelled.

## 2021-06-03 ENCOUNTER — Ambulatory Visit (HOSPITAL_COMMUNITY): Admission: RE | Admit: 2021-06-03 | Payer: Medicare Other | Source: Home / Self Care | Admitting: Cardiology

## 2021-06-03 ENCOUNTER — Encounter (HOSPITAL_COMMUNITY): Admission: RE | Payer: Self-pay | Source: Home / Self Care

## 2021-06-03 SURGERY — CARDIOVERSION
Anesthesia: General

## 2021-06-03 NOTE — Addendum Note (Signed)
Encounter addended by: Oliver Barre, PA on: 06/03/2021 3:07 PM  Actions taken: Level of Service modified

## 2021-06-04 ENCOUNTER — Other Ambulatory Visit: Payer: Medicare Other

## 2021-06-07 IMAGING — DX DG CHEST 1V PORT
1 series · 1 of 1 positions shown · non-contrast
Comparison: 01/07/2018

CLINICAL DATA: Tachycardia, atrial fibrillation, hypertension

EXAM:
PORTABLE CHEST 1 VIEW

[chest ap]
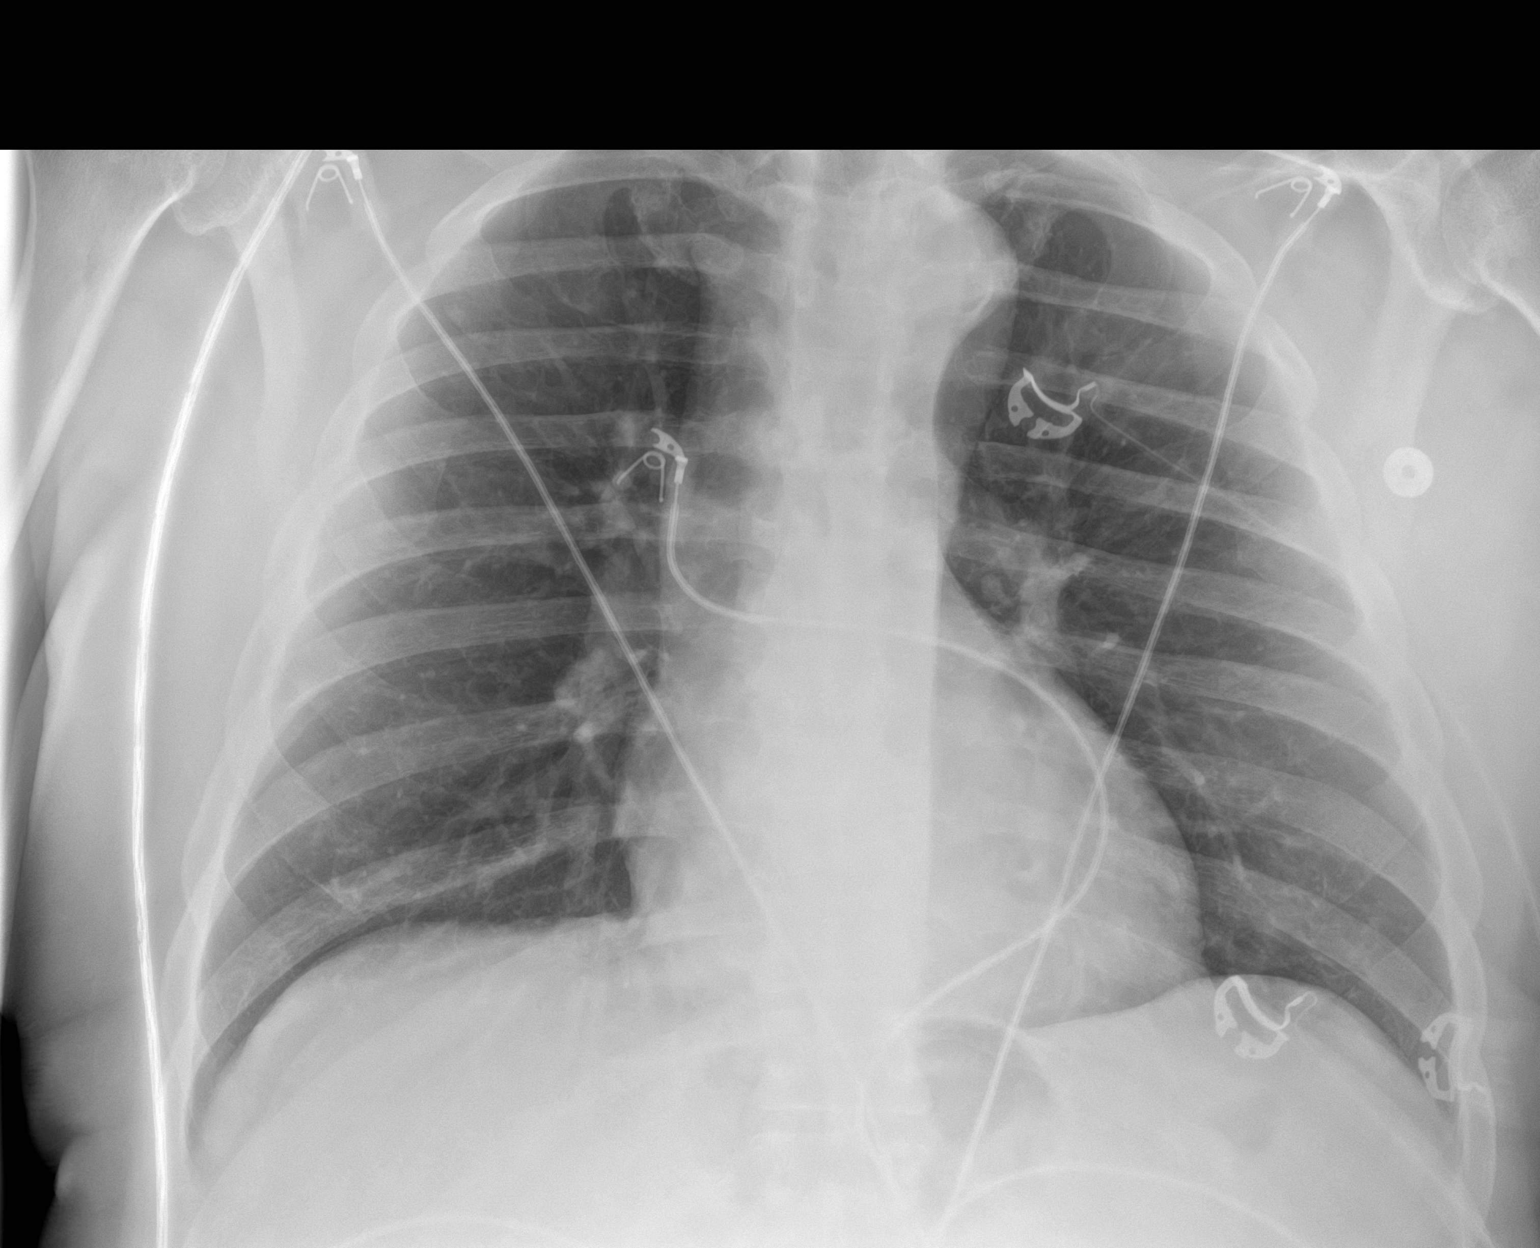

[1 of 1 positions shown; findings below may reference images not displayed]

FINDINGS: The heart size and mediastinal contours are within normal limits.
Both lungs are clear. The visualized skeletal structures are
unremarkable.
IMPRESSION: No active disease.

## 2021-06-09 ENCOUNTER — Ambulatory Visit: Payer: Medicare Other | Admitting: Podiatry

## 2021-06-09 ENCOUNTER — Other Ambulatory Visit: Payer: Self-pay

## 2021-06-09 ENCOUNTER — Encounter: Payer: Self-pay | Admitting: Podiatry

## 2021-06-09 DIAGNOSIS — L600 Ingrowing nail: Secondary | ICD-10-CM | POA: Diagnosis not present

## 2021-06-09 DIAGNOSIS — R2681 Unsteadiness on feet: Secondary | ICD-10-CM

## 2021-06-09 NOTE — Progress Notes (Signed)
Subjective:   Patient ID: John Cobb, male   DOB: 75 y.o.   MRN: [id number]   HPI Patient states the nail seems to be healing well but is crusted and my foot drop seems somewhat improved since I have this procedure done   ROS      Objective:  Physical Exam  Neurovascular status intact with patient's right second nail bed crusted over and foot drop which seems to be improved when I tested EHL EDL and anterior tib     Assessment:  Appears to be improved with foot drop with well-healing ingrown toenail site right second toe reviewed     Plan:  Condition do not recommend bracing but did discuss a foot drop brace which may be necessary in the future with patient to be seen back as needed if symptoms get worse

## 2021-06-12 ENCOUNTER — Ambulatory Visit (HOSPITAL_COMMUNITY): Payer: Medicare Other | Admitting: Nurse Practitioner

## 2021-06-17 ENCOUNTER — Telehealth (HOSPITAL_COMMUNITY): Payer: Self-pay | Admitting: *Deleted

## 2021-06-17 NOTE — Telephone Encounter (Addendum)
Patient called in stating he went back into Afib on Friday. He is currently on a prednisone taper due to full body rash saw his allergist they believe related to stress (his wife has been in the hospital for 80 days following a stroke/pneumonia and is now home with him). He has been on the prednisone taper for over a week, he was also prescribed hydroxyzine which pt has been instructed to stop as it can prolong QT with Tikosyn. On Saturday morning around 4am he got up to go to the bathroom and while exiting the bathroom "passed out" for a second. Fell to floor hitting head and immediately returned to normal but did not seek medical care. Pt was "fine" after this episode. His HR currently is 80-105 BP is good. Re-educated the patient on importance of seeking medical care with falls/hitting head since on Eliquis. He will call his PCP for imaging today. Pt denies symptoms from fall. Pt is scheduled for afib ablation next week. Will forward to Dr. Michela Pitcher as Juluis Rainier. Pt will seek emergency care should symptoms reoccur.

## 2021-06-18 ENCOUNTER — Ambulatory Visit (HOSPITAL_COMMUNITY): Payer: Medicare Other | Admitting: Nurse Practitioner

## 2021-06-23 ENCOUNTER — Telehealth: Payer: Self-pay | Admitting: Internal Medicine

## 2021-06-23 NOTE — Telephone Encounter (Signed)
Patient has his pre op instruction. He was asking about after care instruction. Advised those would be given to him at his discharge from the hospital. Did discuss rest for a 1 week, no lifting or bending related to risk of bleeding.  Patient verbalized understanding.

## 2021-06-23 NOTE — Telephone Encounter (Signed)
Patient is having an ablation this Thursday, and his lost his paperwork for what to do after the procedure.   He would like for another set to be sent to him.

## 2021-06-23 NOTE — Telephone Encounter (Signed)
The patient did not have imaging done. He has not passed out or fell again. He has not missed any doses Eliquis. Stating that he is currently out of afib and feeling great.

## 2021-06-25 ENCOUNTER — Other Ambulatory Visit: Payer: Self-pay | Admitting: Internal Medicine

## 2021-06-25 NOTE — Pre-Procedure Instructions (Signed)
Attempted to call patient regarding procedure instructions.  Left voicemail on the following items: Arrival time 1130 Nothing to eat or drink after midnight No meds AM of procedure Responsible person to drive you home and stay with you for 24 hrs  Have you missed any doses of anti-coagulant Eliquis- take both doses today, none in the morning

## 2021-06-26 ENCOUNTER — Ambulatory Visit (HOSPITAL_COMMUNITY)
Admission: RE | Admit: 2021-06-26 | Discharge: 2021-06-26 | Disposition: A | Discharge status: Home / Self Care | Payer: Medicare Other | Attending: Internal Medicine | Admitting: Internal Medicine

## 2021-06-26 ENCOUNTER — Encounter (HOSPITAL_COMMUNITY)
Admission: RE | Disposition: A | Discharge status: Home / Self Care | Payer: Medicare Other | Source: Home / Self Care | Attending: Internal Medicine

## 2021-06-26 ENCOUNTER — Ambulatory Visit (HOSPITAL_COMMUNITY): Payer: Medicare Other | Admitting: Anesthesiology

## 2021-06-26 DIAGNOSIS — I4819 Other persistent atrial fibrillation: Secondary | ICD-10-CM

## 2021-06-26 HISTORY — PX: ATRIAL FIBRILLATION ABLATION: EP1191

## 2021-06-26 LAB — POCT ACTIVATED CLOTTING TIME
Activated Clotting Time: 269 seconds
Activated Clotting Time: 287 seconds

## 2021-06-26 SURGERY — ATRIAL FIBRILLATION ABLATION
Anesthesia: General

## 2021-06-26 MED ORDER — PHENYLEPHRINE HCL-NACL 20-0.9 MG/250ML-% IV SOLN
INTRAVENOUS | Status: DC | PRN
  Administered 2021-06-26: 50 ug/min via INTRAVENOUS
  Administered 2021-06-26: 25 ug/min via INTRAVENOUS

## 2021-06-26 MED ORDER — ONDANSETRON HCL 4 MG/2ML IJ SOLN: 4.0000 mg | Freq: Four times a day (QID) | INTRAMUSCULAR | Status: DC | PRN

## 2021-06-26 MED ORDER — SODIUM CHLORIDE 0.9% FLUSH: 3.0000 mL | Freq: Two times a day (BID) | INTRAVENOUS | Status: DC

## 2021-06-26 MED ORDER — ROCURONIUM BROMIDE 10 MG/ML (PF) SYRINGE
PREFILLED_SYRINGE | INTRAVENOUS | Status: DC | PRN
  Administered 2021-06-26: 20 mg via INTRAVENOUS
  Administered 2021-06-26: 60 mg via INTRAVENOUS

## 2021-06-26 MED ORDER — HEPARIN (PORCINE) IN NACL 2000-0.9 UNIT/L-% IV SOLN
INTRAVENOUS | Status: AC
  Filled 2021-06-26: qty 1000

## 2021-06-26 MED ORDER — SUGAMMADEX SODIUM 200 MG/2ML IV SOLN
INTRAVENOUS | Status: DC | PRN
  Administered 2021-06-26: 400 mg via INTRAVENOUS

## 2021-06-26 MED ORDER — HEPARIN SODIUM (PORCINE) 1000 UNIT/ML IJ SOLN
INTRAMUSCULAR | Status: DC | PRN
  Administered 2021-06-26: 3000 [IU] via INTRAVENOUS
  Administered 2021-06-26: 4000 [IU] via INTRAVENOUS

## 2021-06-26 MED ORDER — HEPARIN (PORCINE) IN NACL 2000-0.9 UNIT/L-% IV SOLN
INTRAVENOUS | Status: DC | PRN
  Administered 2021-06-26: 1000 mL

## 2021-06-26 MED ORDER — FENTANYL CITRATE (PF) 100 MCG/2ML IJ SOLN
INTRAMUSCULAR | Status: AC
  Filled 2021-06-26: qty 2

## 2021-06-26 MED ORDER — PROPOFOL 10 MG/ML IV BOLUS
INTRAVENOUS | Status: DC | PRN
  Administered 2021-06-26: 200 mg via INTRAVENOUS

## 2021-06-26 MED ORDER — HEPARIN SODIUM (PORCINE) 1000 UNIT/ML IJ SOLN
INTRAMUSCULAR | Status: DC | PRN
  Administered 2021-06-26: 15000 [IU] via INTRAVENOUS
  Administered 2021-06-26: 1000 [IU] via INTRAVENOUS

## 2021-06-26 MED ORDER — FENTANYL CITRATE (PF) 100 MCG/2ML IJ SOLN
INTRAMUSCULAR | Status: DC | PRN
  Administered 2021-06-26: 100 ug via INTRAVENOUS

## 2021-06-26 MED ORDER — SODIUM CHLORIDE 0.9 % IV SOLN: INTRAVENOUS | Status: DC

## 2021-06-26 MED ORDER — ACETAMINOPHEN 325 MG PO TABS: 650.0000 mg | ORAL_TABLET | ORAL | Status: DC | PRN

## 2021-06-26 MED ORDER — LIDOCAINE 2% (20 MG/ML) 5 ML SYRINGE
INTRAMUSCULAR | Status: DC | PRN
  Administered 2021-06-26: 60 mg via INTRAVENOUS

## 2021-06-26 MED ORDER — HYDROCODONE-ACETAMINOPHEN 5-325 MG PO TABS: 1.0000 | ORAL_TABLET | ORAL | Status: DC | PRN

## 2021-06-26 MED ORDER — APIXABAN 5 MG PO TABS
5.0000 mg | ORAL_TABLET | ORAL | Status: AC
  Administered 2021-06-26: 5 mg via ORAL
  Filled 2021-06-26: qty 1

## 2021-06-26 MED ORDER — PROTAMINE SULFATE 10 MG/ML IV SOLN
INTRAVENOUS | Status: DC | PRN
  Administered 2021-06-26: 10 mg via INTRAVENOUS

## 2021-06-26 MED ORDER — ONDANSETRON HCL 4 MG/2ML IJ SOLN
INTRAMUSCULAR | Status: DC | PRN
  Administered 2021-06-26: 4 mg via INTRAVENOUS

## 2021-06-26 MED ORDER — HEPARIN SODIUM (PORCINE) 1000 UNIT/ML IJ SOLN
INTRAMUSCULAR | Status: AC
  Filled 2021-06-26: qty 20

## 2021-06-26 MED ORDER — HEPARIN (PORCINE) IN NACL 1000-0.9 UT/500ML-% IV SOLN
INTRAVENOUS | Status: DC | PRN
  Administered 2021-06-26: 500 mL

## 2021-06-26 MED ORDER — SODIUM CHLORIDE 0.9 % IV SOLN: 250.0000 mL | INTRAVENOUS | Status: DC | PRN

## 2021-06-26 MED ORDER — SODIUM CHLORIDE 0.9% FLUSH: 3.0000 mL | INTRAVENOUS | Status: DC | PRN

## 2021-06-26 SURGICAL SUPPLY — 18 items
CATH OCTARAY 2.0 F 3-3-3-3-3 (CATHETERS) ×1 IMPLANT
CATH SMTCH THERMOCOOL SF DF (CATHETERS) ×1 IMPLANT
CATH SOUNDSTAR ECO 8FR (CATHETERS) ×1 IMPLANT
CATH WEBSTER BI DIR CS D-F CRV (CATHETERS) ×1 IMPLANT
CLOSURE PERCLOSE PROSTYLE (VASCULAR PRODUCTS) ×3 IMPLANT
COVER SWIFTLINK CONNECTOR (BAG) ×2 IMPLANT
MAT PREVALON FULL STRYKER (MISCELLANEOUS) ×1 IMPLANT
NDL BAYLIS TRANSSEPTAL 71CM (NEEDLE) IMPLANT
NEEDLE BAYLIS TRANSSEPTAL 71CM (NEEDLE) ×2 IMPLANT
PACK EP LATEX FREE (CUSTOM PROCEDURE TRAY) ×2
PACK EP LF (CUSTOM PROCEDURE TRAY) ×1 IMPLANT
PAD DEFIB RADIO PHYSIO CONN (PAD) ×2 IMPLANT
PATCH CARTO3 (PAD) ×1 IMPLANT
SHEATH PINNACLE 7F 10CM (SHEATH) ×2 IMPLANT
SHEATH PINNACLE 9F 10CM (SHEATH) ×1 IMPLANT
SHEATH PROBE COVER 6X72 (BAG) ×2 IMPLANT
SHEATH SWARTZ TS SL2 63CM 8.5F (SHEATH) ×1 IMPLANT
TUBING SMART ABLATE COOLFLOW (TUBING) ×1 IMPLANT

## 2021-06-26 NOTE — Anesthesia Procedure Notes (Signed)
Procedure Name: Intubation Date/Time: 06/26/2021 1:54 PM Performed by: Georgia Duff, CRNA Pre-anesthesia Checklist: Patient identified, Emergency Drugs available, Suction available and Patient being monitored Patient Re-evaluated:Patient Re-evaluated prior to induction Oxygen Delivery Method: Circle System Utilized Preoxygenation: Pre-oxygenation with 100% oxygen Induction Type: IV induction Ventilation: Mask ventilation without difficulty Laryngoscope Size: Miller and 3 Grade View: Grade I Tube type: Oral Tube size: 7.5 mm Number of attempts: 1 Airway Equipment and Method: Stylet and Oral airway Placement Confirmation: ETT inserted through vocal cords under direct vision, positive ETCO2 and breath sounds checked- equal and bilateral Secured at: 22 cm Tube secured with: Tape Dental Injury: Teeth and Oropharynx as per pre-operative assessment

## 2021-06-26 NOTE — H&P (Signed)
PCP: [facility] Primary Cardiologist: Dr Radford Pax Primary EP: Dr Rayann Heman   John Cobb is a 75 y.o. male who presents today for electrophysiology study and ablation for afib.  Since last being seen in our clinic, the patient has had worsening afib.  + palpitations and fatigue during afib episodes.  He is in sinus today.  Today, he denies symptoms of chest pain, shortness of breath,  lower extremity edema, dizziness, presyncope, or syncope.  The patient is otherwise without complaint today.        Past Medical History:  Diagnosis Date   Arthritis     Benign essential HTN 01/05/2018   Blood transfusion      As an infant   CKD (chronic kidney disease), stage III ([place])     Dislocated IOL (intraocular lens), posterior 10/02/2011   High cholesterol     Hypertension     Nonischemic cardiomyopathy ([place] [facility]) 04/19/2018   Persistent atrial fibrillation ([place])     Pneumonia ~ 1978         Past Surgical History:  Procedure Laterality Date   ANTERIOR CERVICAL DISCECTOMY   2008   APPENDECTOMY        "as an infant"   CARDIOVERSION N/A 03/11/2018    Procedure: CARDIOVERSION;  Surgeon: [name], MD;  Location: [facility];  Service: Cardiovascular;  Laterality: N/A;   CARDIOVERSION N/A 04/21/2018    Procedure: CARDIOVERSION;  Surgeon: [name], MD;  Location: [facility];  Service: Cardiovascular;  Laterality: N/A;   CATARACT EXTRACTION W/ INTRAOCULAR LENS IMPLANT   08/13/11    left   CATARACT EXTRACTION W/ INTRAOCULAR LENS IMPLANT   09/03/11    right   Colonscopy       Compound Fractures   06/1980    right arm; repaired with 2 plates and 10 pins.   FRACTURE SURGERY       GAS/FLUID EXCHANGE   10/20/2011    Procedure: GAS/FLUID EXCHANGE;  Surgeon: Hayden Pedro, MD;  Location: [facility];  Service: Ophthalmology;  Laterality: Right;   HERNIA REPAIR        "umbilical; as an infant"   INTRAOCULAR LENS EXCHANGE   10/20/11    right eye   PARS PLANA VITRECTOMY    10/20/2011    Procedure: PARS PLANA VITRECTOMY WITH 25G REMOVAL/SUTURE INTRAOCULAR LENS;  Surgeon: Hayden Pedro, MD;  Location: [facility];  Service: Ophthalmology;  Laterality: Right;   RIGHT/LEFT HEART CATH AND CORONARY ANGIOGRAPHY N/A 01/20/2018    Procedure: RIGHT/LEFT HEART CATH AND CORONARY ANGIOGRAPHY;  Surgeon: Belva Crome, MD;  Location: [facility];  Service: Cardiovascular;  Laterality: N/A;      ROS- all systems are reviewed and negatives except as per HPI above         Current Outpatient Medications  Medication Sig Dispense Refill   alclomethasone (ACLOVATE) 0.05 % cream Apply 1 application topically daily as needed (for rash). Forehead       diltiazem (CARDIZEM) 30 MG tablet TAKE 1 TABLET EVERY 4 HOURS AS NEEDED FOR AF HEART RATE >100 AS LONG AS TOP BP >100. 30 tablet 1   dofetilide (TIKOSYN) 250 MCG capsule TAKE 1 CAPSULE TWICE DAILY 180 capsule 3   doxazosin (CARDURA) 8 MG tablet Take 8 mg by mouth every evening.        ELIQUIS 5 MG TABS tablet TAKE 1 TABLET BY MOUTH TWICE A DAY 60 tablet 5   EPINEPHrine 0.3 mg/0.3 mL IJ SOAJ  injection         finasteride (PROSCAR) 5 MG tablet Take 5 mg by mouth daily.       furosemide (LASIX) 20 MG tablet Take 1 tablet (20 mg total) by mouth daily. 90 tablet 3   metoprolol succinate (TOPROL-XL) 25 MG 24 hr tablet TAKE 1/2 TABLET BY MOUTH EVERY DAY 45 tablet 2   Multiple Vitamin (MULITIVITAMIN WITH MINERALS) TABS Take 1 tablet by mouth every evening.        pravastatin (PRAVACHOL) 80 MG tablet Take 80 mg by mouth at bedtime.        tamsulosin (FLOMAX) 0.4 MG CAPS capsule Take 0.4 mg by mouth daily.       losartan (COZAAR) 100 MG tablet Take 1 tablet (100 mg total) by mouth daily. 90 tablet 3    No current facility-administered medications for this visit.      Physical Exam: Vitals:   06/26/21 1143  BP: (!) 160/74  Pulse: 64  Resp: 15  Temp: 97.7 F (36.5 C)  SpO2: 100%   GEN- The patient is well appearing, alert and  oriented x 3 today.   Head- normocephalic, atraumatic Eyes-  Sclera clear, conjunctiva pink Ears- hearing intact Oropharynx- clear Lungs- Clear to ausculation bilaterally, normal work of breathing Heart- RRR GI- soft  Extremities- no clubbing, cyanosis, or edema    Assessment and Plan:   Persistent afib The patient has symptomatic, recurrent  atrial fibrillation. he has failed medical therapy with tikosyn. Chads2vasc score is 3.  he is anticoagulated with eliquis .   Risk, benefits, and alternatives to EP study and radiofrequency ablation for afib were again discussed in detail today. These risks include but are not limited to stroke, bleeding, vascular damage, tamponade, perforation, damage to the esophagus, lungs, and other structures, pulmonary vein stenosis, worsening renal function, and death. The patient understands these risk and wishes to proceed.    Cardiac CT reviewed at length with the patient today.  he reports compliance with [place] without interruption.  Thompson Grayer MD, [place] 06/26/2021 1:08 PM

## 2021-06-26 NOTE — Transfer of Care (Signed)
Immediate Anesthesia Transfer of Care Note  Patient: John Cobb  Procedure(s) Performed: ATRIAL FIBRILLATION ABLATION  Patient Location: PACU  Anesthesia Type:General  Level of Consciousness: unresponsive and patient cooperative  Airway & Oxygen Therapy: Patient Spontanous Breathing and Patient connected to nasal cannula oxygen  Post-op Assessment: Report given to RN and Post -op Vital signs reviewed and stable  Post vital signs: Reviewed and stable  Last Vitals:  Vitals Value Taken Time  BP 149/71 06/26/21 1611  Temp 36.2 C 06/26/21 1611  Pulse 56 06/26/21 1611  Resp 12 06/26/21 1611  SpO2 97 % 06/26/21 1611    Last Pain:  Vitals:   06/26/21 1611  TempSrc: Temporal  PainSc: 0-No pain         Complications: No notable events documented.

## 2021-06-26 NOTE — Anesthesia Preprocedure Evaluation (Signed)
Anesthesia Evaluation  Patient identified by MRN, date of birth, ID band Patient awake    Reviewed: Allergy & Precautions, H&P , NPO status , Patient's Chart, lab work & pertinent test results  Airway Mallampati: II   Neck ROM: full    Dental   Pulmonary    breath sounds clear to auscultation       Cardiovascular hypertension, + dysrhythmias Atrial Fibrillation  Rhythm:regular Rate:Normal     Neuro/Psych    GI/Hepatic   Endo/Other    Renal/GU Renal disease     Musculoskeletal  (+) Arthritis ,   Abdominal   Peds  Hematology   Anesthesia Other Findings   Reproductive/Obstetrics                             Anesthesia Physical Anesthesia Plan  ASA: 3  Anesthesia Plan: General   Post-op Pain Management:    Induction: Intravenous  PONV Risk Score and Plan: 2 and Ondansetron, Dexamethasone and Treatment may vary due to age or medical condition  Airway Management Planned: Oral ETT  Additional Equipment:   Intra-op Plan:   Post-operative Plan: Extubation in OR  Informed Consent: I have reviewed the patients History and Physical, chart, labs and discussed the procedure including the risks, benefits and alternatives for the proposed anesthesia with the patient or authorized representative who has indicated his/her understanding and acceptance.     Dental advisory given  Plan Discussed with: CRNA, Anesthesiologist and Surgeon  Anesthesia Plan Comments:         Anesthesia Quick Evaluation

## 2021-06-27 ENCOUNTER — Encounter (HOSPITAL_COMMUNITY): Payer: Self-pay | Admitting: Internal Medicine

## 2021-06-28 NOTE — Anesthesia Postprocedure Evaluation (Signed)
Anesthesia Post Note  Patient: John Cobb  Procedure(s) Performed: ATRIAL FIBRILLATION ABLATION     Patient location during evaluation: Cath Lab Anesthesia Type: General Level of consciousness: awake and alert Pain management: pain level controlled Vital Signs Assessment: post-procedure vital signs reviewed and stable Respiratory status: spontaneous breathing, nonlabored ventilation, respiratory function stable and patient connected to nasal cannula oxygen Cardiovascular status: blood pressure returned to baseline and stable Postop Assessment: no apparent nausea or vomiting Anesthetic complications: no   No notable events documented.  Last Vitals:  Vitals:   06/26/21 1912 06/26/21 1942  BP: (!) 176/65 (!) 168/88  Pulse: 62 72  Resp: 12 13  Temp:    SpO2: 98% 97%    Last Pain:  Vitals:   06/26/21 1700  TempSrc:   PainSc: 0-No pain                 [name]

## 2021-07-16 ENCOUNTER — Telehealth (HOSPITAL_COMMUNITY): Payer: Self-pay | Admitting: *Deleted

## 2021-07-16 NOTE — Telephone Encounter (Signed)
Pt called in stating he went into afib about 2 hours ago HRs 70-148 with any movement his HRs are over 150. Feels lightheaded and fatigued.  BP 145/112. Took all his normal morning medications at 9am.  Pt has PRN cardizem 30mg  - he will take one now and repeat as needed every 4 hours - if still out this evening he will take an extra 1/2 of metoprolol as previously directed.  Will call tomorrow if still out for further instructions/assessment. Pt in agreement.

## 2021-07-17 NOTE — Telephone Encounter (Signed)
Pt converted back into NSR this afternoon. Will continue to monitor and call if issues arise.

## 2021-07-18 ENCOUNTER — Ambulatory Visit (HOSPITAL_COMMUNITY): Payer: Medicare Other | Admitting: Physician Assistant

## 2021-07-22 ENCOUNTER — Encounter: Payer: Self-pay | Admitting: Cardiology

## 2021-07-22 ENCOUNTER — Other Ambulatory Visit: Payer: Self-pay

## 2021-07-22 ENCOUNTER — Telehealth (INDEPENDENT_AMBULATORY_CARE_PROVIDER_SITE_OTHER): Payer: Medicare Other | Admitting: Cardiology

## 2021-07-22 VITALS — BP 130/86 | HR 62 | Ht 72.0 in | Wt 210.0 lb

## 2021-07-22 DIAGNOSIS — G4733 Obstructive sleep apnea (adult) (pediatric): Secondary | ICD-10-CM | POA: Diagnosis not present

## 2021-07-22 DIAGNOSIS — E7800 Pure hypercholesterolemia, unspecified: Secondary | ICD-10-CM

## 2021-07-22 DIAGNOSIS — I428 Other cardiomyopathies: Secondary | ICD-10-CM

## 2021-07-22 DIAGNOSIS — I480 Paroxysmal atrial fibrillation: Secondary | ICD-10-CM | POA: Diagnosis not present

## 2021-07-22 DIAGNOSIS — I2510 Atherosclerotic heart disease of native coronary artery without angina pectoris: Secondary | ICD-10-CM

## 2021-07-22 NOTE — Patient Instructions (Signed)
Medication Instructions:  Your physician recommends that you continue on your current medications as directed. Please refer to the Current Medication list given to you today.  *If you need a refill on your cardiac medications before your next appointment, please call your pharmacy*   Lab Work: Your physician recommends that you return for lab work in: Fasting   If you have labs (blood work) drawn today and your tests are completely normal, you will receive your results only by: [place] (if you have [place]) OR A paper copy in the mail If you have any lab test that is abnormal or we need to change your treatment, we will call you to review the results.   Testing/Procedures: NONE    Follow-Up: At [facility], you and your health needs are our priority.  As part of our continuing mission to provide you with exceptional heart care, we have created designated Provider Care Teams.  These Care Teams include your primary Cardiologist (physician) and Advanced Practice Providers (APPs -  Physician Assistants and Nurse Practitioners) who all work together to provide you with the care you need, when you need it.  We recommend signing up for the patient portal called "MyChart".  Sign up information is provided on this After Visit Summary.  MyChart is used to connect with patients for Virtual Visits (Telemedicine).  Patients are able to view lab/test results, encounter notes, upcoming appointments, etc.  Non-urgent messages can be sent to your provider as well.   To learn more about what you can do with MyChart, go to [email or web address].    Your next appointment:   1 year(s)  The format for your next appointment:   In Person  Provider:   Fransico Him, MD     Other Instructions Thank you for choosing [facility]!

## 2021-07-22 NOTE — Addendum Note (Signed)
Addended by: Levonne Hubert on: 07/22/2021 11:38 AM   Modules accepted: Orders

## 2021-07-22 NOTE — Progress Notes (Signed)
Virtual Visit via Telephone Note   This visit type was conducted due to national recommendations for restrictions regarding the COVID-19 Pandemic (e.g. social distancing) in an effort to limit this patient's exposure and mitigate transmission in our community.  Due to his co-morbid illnesses, this patient is at least at moderate risk for complications without adequate follow up.  This format is felt to be most appropriate for this patient at this time.  All issues noted in this document were discussed and addressed.  A limited physical exam was performed with this format.  Please refer to the patient's chart for his consent to telehealth for [facility].  Video Connection Lost Video connection was lost at > 50% of the duration of this visit, at which time the remainder of the visit was completed via audio only.      Date:  07/22/2021   ID:  John Cobb, DOB 26-May-1946, MRN [id number] The patient was identified using 2 identifiers.  Patient Location: Home Provider Location: Home Office   PCP:  [facility] [place] [facility] Cardiologist:  Fransico Him, MD     Evaluation Performed:  Follow-Up Visit  Chief Complaint:  OSA  History of Present Illness:    John Cobb is a 76 y.o. Cobb with a history of hyperlipidemia, hypertension and PAF followed by Dr. Rayann Heman and afib clinic.  He has had 2 DCCV in 2019 as well as a cardiac cath in 2019 showing mild ASCAD with 30% mLAD at the bifurcation of a large Diagonal with 30% pD1 and otherwise ostial and mid RCA luminal irregularities. LVEDP was elevated as well as PCW at 18-73mmHg and was felt to have a NICM possibly related to tachy medicated process from afib.  He eventually was placed on Tikosyn for maintenance of NSR.  He recently underwent afib ablation 06/2021 for frequent bouts of PAF.     He was referred for sleep study and underwent PSG showing severe OSA with an AHI of 41.5/hr, Central  Sleep apnea with CSI 8.4/hr and nocturnal hypoxemia with O2 sats as low as 83%.  He had moderate snoring.  He underwent CPAP titration to 10cm H2O.    He is here today for followup and is doing well.  He denies any chest pain or pressure, SOB, DOE, PND, orthopnea, LE edema, dizziness or syncope.  He had a breakthrough of palpitations a week ago after having an afib ablation 06/26/2021.  He says that he was in afib for 2 days but went back into NSR.  He is compliant with his meds and is tolerating meds with no SE.     He is doing well with his CPAP device and thinks that He has gotten used to it.  He tolerates the mask and feels the pressure is adequate.  Since going on CPAP he feels rested in the am and has no significant daytime sleepiness.  He denies any significant mouth or nasal dryness or nasal congestion.  He does not think that he snores.     The patient does not have symptoms concerning for COVID-19 infection (fever, chills, cough, or new shortness of breath).    Past Medical History:  Diagnosis Date   Arthritis    Benign essential HTN 01/05/2018   Blood transfusion    As an infant   CKD (chronic kidney disease), stage III ([place])    Dislocated IOL (intraocular lens), posterior 10/02/2011   High cholesterol    Hypertension  Nonischemic cardiomyopathy ([place]) 04/19/2018   Persistent atrial fibrillation ([place])    Pneumonia ~ 1978   Past Surgical History:  Procedure Laterality Date   ANTERIOR CERVICAL DISCECTOMY  2008   APPENDECTOMY     "as an infant"   ATRIAL FIBRILLATION ABLATION N/A 06/26/2021   Procedure: ATRIAL FIBRILLATION ABLATION;  Surgeon: Thompson Grayer, MD;  Location: [place] CV [facility];  Service: Cardiovascular;  Laterality: N/A;   CARDIOVERSION N/A 03/11/2018   Procedure: CARDIOVERSION;  Surgeon: [name], MD;  [facility];  Service: Cardiovascular;  Laterality: N/A;   CARDIOVERSION N/A 04/21/2018   Procedure: CARDIOVERSION;  Surgeon: [name],  MD;  Location: [place];  Service: Cardiovascular;  Laterality: N/A;   CATARACT EXTRACTION W/ INTRAOCULAR LENS IMPLANT  08/13/11   left   CATARACT EXTRACTION W/ INTRAOCULAR LENS IMPLANT  09/03/11   right   Colonscopy     Compound Fractures  06/1980   right arm; repaired with 2 plates and 10 pins.   FRACTURE SURGERY     GAS/FLUID EXCHANGE  10/20/2011   Procedure: GAS/FLUID EXCHANGE;  Surgeon: Hayden Pedro, MD;  Location: [place];  Service: Ophthalmology;  Laterality: Right;   HERNIA REPAIR     "umbilical; as an infant"   INTRAOCULAR LENS EXCHANGE  10/20/11   right eye   PARS PLANA VITRECTOMY  10/20/2011   Procedure: PARS PLANA VITRECTOMY WITH 25G REMOVAL/SUTURE INTRAOCULAR LENS;  Surgeon: Hayden Pedro, MD;  Location: [place];  Service: Ophthalmology;  Laterality: Right;   RIGHT/LEFT HEART CATH AND CORONARY ANGIOGRAPHY N/A 01/20/2018   Procedure: RIGHT/LEFT HEART CATH AND CORONARY ANGIOGRAPHY;  Surgeon: Belva Crome, MD;  Location: [place] CV LAB;  Service: Cardiovascular;  Laterality: N/A;     Current Meds  Medication Sig   alclomethasone (ACLOVATE) 0.05 % cream Apply 1 application topically daily as needed (rash).   cetirizine (ZYRTEC) 10 MG tablet Take 10 mg by mouth daily.   dofetilide (TIKOSYN) 250 MCG capsule TAKE 1 CAPSULE TWICE DAILY   doxazosin (CARDURA) 8 MG tablet TAKE 1 TABLET BY MOUTH EVERYDAY AT BEDTIME   ELIQUIS 5 MG TABS tablet TAKE 1 TABLET BY MOUTH TWICE A DAY   EPINEPHrine 0.3 mg/0.3 mL IJ SOAJ injection Inject 0.3 mg into the muscle as needed for anaphylaxis.   furosemide (LASIX) 20 MG tablet TAKE 1 TABLET BY MOUTH EVERY DAY   losartan (COZAAR) 100 MG tablet Take 100 mg by mouth daily.   metoprolol succinate (TOPROL-XL) 25 MG 24 hr tablet Take 0.5 tablets (12.5 mg total) by mouth daily. May take an extra 1/2 tablet for breakthrough afib   Multiple Vitamin (MULITIVITAMIN WITH MINERALS) TABS Take 1 tablet by mouth every evening.    NON FORMULARY Allergy shots-  Takes once a month   omeprazole (PRILOSEC) 20 MG capsule Take 20 mg by mouth every other day.   pravastatin (PRAVACHOL) 80 MG tablet Take 80 mg by mouth at bedtime.    tamsulosin (FLOMAX) 0.4 MG CAPS capsule Take 0.4 mg by mouth daily after breakfast.   [DISCONTINUED] diltiazem (CARDIZEM CD) 120 MG 24 hr capsule Take 1 capsule (120 mg total) by mouth daily as needed (breakthrough afib). (Patient taking differently: Take 120 mg by mouth in the morning.)   [DISCONTINUED] diltiazem (CARDIZEM) 30 MG tablet TAKE 1 TABLET EVERY 4 HOURS AS NEEDED FOR AF HEART RATE >100 AS LONG AS TOP BP >100.     Allergies:   Honey bee treatment [bee venom], Wasp venom, Neosporin [neomycin-bacitracin  zn-polymyx], Nsaids, Contrast media [iodinated contrast media], and Neosporin plus max st   Social History   Tobacco Use   Smoking status: Never   Smokeless tobacco: Former    Types: Chew    Quit date: 07/13/2008  Vaping Use   Vaping Use: Never used  Substance Use Topics   Alcohol use: Not Currently    Alcohol/week: 1.0 standard drink    Types: 1 Cans of beer per week    Comment: out to eat   Drug use: No     Family Hx: The patient's family history includes Heart attack in his father; Lung cancer in his mother; Stomach cancer in his mother. There is no history of Anesthesia problems.  ROS:   Please see the history of present illness.     All other systems reviewed and are negative.   Prior CV studies:   The following studies were reviewed today:  PAP compliance download  Labs/Other Tests and Data Reviewed:    EKG:  No ECG reviewed.  Recent Labs: 05/30/2021: BUN 31; Creatinine, Ser 1.51; Hemoglobin 14.5; Magnesium 2.0; Platelets 180; Potassium 4.6; Sodium 136   Recent Lipid Panel No results found for: CHOL, TRIG, HDL, CHOLHDL, LDLCALC, LDLDIRECT  Wt Readings from Last 3 Encounters:  07/22/21 210 lb (95.3 kg)  06/26/21 206 lb (93.4 kg)  05/30/21 199 lb 9.6 oz (90.5 kg)     Risk  Assessment/Calculations:    CHA2DS2-VASc Score = 2  This indicates a 2.2% annual risk of stroke. The patient's score is based upon: CHF History: 0 HTN History: 1 Diabetes History: 0 Stroke History: 0 Vascular Disease History: 0 Age Score: 1 Gender Score: 0       Objective:    Vital Signs:  BP 130/86    Pulse 62    Ht 6' (1.829 m)    Wt 210 lb (95.3 kg)    BMI 28.48 kg/m      ASSESSMENT & PLAN:    OSA - The patient is tolerating PAP therapy well without any problems. The PAP download performed by his DME was personally reviewed and interpreted by me today and showed an AHI of 2.4 /hr on 10 cm H2O with 73 below % compliance in using more than 4 hours nightly.  The patient has been using and benefiting from PAP use and will continue to benefit from therapy.   Hyperlipidemia -LDL goal is less than 70 -Check FLP and ALT -Continue prescription drug therapy with pravastatin 80 mg daily with as needed refills  3.  ASCAD -cardiac cath in 2019 showing mild ASCAD with 30% mLAD at the bifurcation of a large Diagonal with 30% pD1 and otherwise ostial and mid RCA luminal irregularities. LVEDP was elevated as well as PCW at 18-54mmHg and was felt to have a NICM possibly related to tachy medicated process from afib.  -He has not had any anginal symptoms -No ASA due to DOAC -Continue prescription drug management pravastatin 80 mg daily and Toprol-XL 12.5 mg daily with as needed refills  4.  Paroxysmal atrial fibrillation -Followed by Dr. Rayann Heman in [place] clinic -s/p recent afib ablation 06/2021 -Continue Tikosyn 250 mcg twice daily, Toprol-XL 12.5 mg daily and Eliquis 5 mg twice daily  5.  Nonischemic dilated cardiomyopathy -Minimal nonobstructive disease on cath felt related to tachycardia mediated process from his atrial fibrillation -EF was 20 to 25% by echo 2019 and normalized at 55 to 60% on echo 2020 -Continue prescription drug management with Lasix 20 mg  daily, losartan 100 mg  daily, Toprol XL 12.5 mg daily with as needed refills -Review of recent labs on 05/30/2021 showed a serum creatinine of 1.51 which is stable and potassium 4.6 chest  6.  HTN -BP controlled -continue Losartan 100mg  daily, Toprol XL 12.5mg  daily, Cardura 8mg  qhs     COVID-19 Education: The signs and symptoms of COVID-19 were discussed with the patient and how to seek care for testing (follow up with PCP or arrange E-visit).  The importance of social distancing was discussed today.  Time:   Today, I have spent 20 minutes with the patient with telehealth technology discussing the above problems.     Medication Adjustments/Labs and Tests Ordered: Current medicines are reviewed at length with the patient today.  Concerns regarding medicines are outlined above.   Tests Ordered: No orders of the defined types were placed in this encounter.   Medication Changes: No orders of the defined types were placed in this encounter.   Follow Up:  In Person in 1 year(s)  Signed, Fransico Him, MD  07/22/2021 11:33 AM    [place]

## 2021-07-30 ENCOUNTER — Ambulatory Visit (HOSPITAL_COMMUNITY)
Admission: RE | Admit: 2021-07-30 | Discharge: 2021-07-30 | Disposition: A | Discharge status: Home / Self Care | Payer: Medicare Other | Source: Ambulatory Visit | Attending: Nurse Practitioner | Admitting: Nurse Practitioner

## 2021-07-30 ENCOUNTER — Other Ambulatory Visit: Payer: Self-pay

## 2021-07-30 ENCOUNTER — Encounter (HOSPITAL_COMMUNITY): Payer: Self-pay | Admitting: Nurse Practitioner

## 2021-07-30 ENCOUNTER — Other Ambulatory Visit: Payer: Medicare Other | Admitting: *Deleted

## 2021-07-30 VITALS — BP 138/74 | HR 59 | Ht 72.0 in | Wt 203.6 lb

## 2021-07-30 DIAGNOSIS — R7303 Prediabetes: Secondary | ICD-10-CM | POA: Insufficient documentation

## 2021-07-30 DIAGNOSIS — Z9989 Dependence on other enabling machines and devices: Secondary | ICD-10-CM | POA: Insufficient documentation

## 2021-07-30 DIAGNOSIS — I959 Hypotension, unspecified: Secondary | ICD-10-CM | POA: Insufficient documentation

## 2021-07-30 DIAGNOSIS — R0683 Snoring: Secondary | ICD-10-CM | POA: Insufficient documentation

## 2021-07-30 DIAGNOSIS — I480 Paroxysmal atrial fibrillation: Secondary | ICD-10-CM

## 2021-07-30 DIAGNOSIS — I129 Hypertensive chronic kidney disease with stage 1 through stage 4 chronic kidney disease, or unspecified chronic kidney disease: Secondary | ICD-10-CM | POA: Diagnosis not present

## 2021-07-30 DIAGNOSIS — I4819 Other persistent atrial fibrillation: Secondary | ICD-10-CM | POA: Diagnosis present

## 2021-07-30 DIAGNOSIS — Z7901 Long term (current) use of anticoagulants: Secondary | ICD-10-CM | POA: Insufficient documentation

## 2021-07-30 DIAGNOSIS — R001 Bradycardia, unspecified: Secondary | ICD-10-CM | POA: Diagnosis not present

## 2021-07-30 DIAGNOSIS — G4733 Obstructive sleep apnea (adult) (pediatric): Secondary | ICD-10-CM | POA: Insufficient documentation

## 2021-07-30 DIAGNOSIS — D6869 Other thrombophilia: Secondary | ICD-10-CM | POA: Diagnosis not present

## 2021-07-30 DIAGNOSIS — Z01818 Encounter for other preprocedural examination: Secondary | ICD-10-CM

## 2021-07-30 DIAGNOSIS — I428 Other cardiomyopathies: Secondary | ICD-10-CM | POA: Diagnosis not present

## 2021-07-30 DIAGNOSIS — Z8249 Family history of ischemic heart disease and other diseases of the circulatory system: Secondary | ICD-10-CM | POA: Insufficient documentation

## 2021-07-30 DIAGNOSIS — N183 Chronic kidney disease, stage 3 unspecified: Secondary | ICD-10-CM | POA: Diagnosis not present

## 2021-07-30 DIAGNOSIS — Z79899 Other long term (current) drug therapy: Secondary | ICD-10-CM | POA: Insufficient documentation
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
## 2021-07-31 LAB — LIPID PANEL
Chol/HDL Ratio: 2.3 ratio (ref 0.0–5.0)
Cholesterol, Total: 140 mg/dL (ref 100–199)
HDL: 61 mg/dL (ref >39)
LDL Chol Calc (NIH): 64 mg/dL (ref 0–99)
Triglycerides: 76 mg/dL (ref 0–149)
VLDL Cholesterol Cal: 15 mg/dL (ref 5–40)

## 2021-07-31 LAB — CBC WITH DIFFERENTIAL/PLATELET
Basophils Absolute: 0.1 10*3/uL (ref 0.0–0.2)
Basos: 1 %
EOS (ABSOLUTE): 0.2 10*3/uL (ref 0.0–0.4)
Eos: 2 %
Hematocrit: 40.6 % (ref 37.5–51.0)
Hemoglobin: 13.8 g/dL (ref 13.0–17.7)
Immature Grans (Abs): 0.1 10*3/uL (ref 0.0–0.1)
Immature Granulocytes: 1 %
Lymphocytes Absolute: 1.8 10*3/uL (ref 0.7–3.1)
Lymphs: 20 %
MCH: 29.9 pg (ref 26.6–33.0)
MCHC: 34 g/dL (ref 31.5–35.7)
MCV: 88 fL (ref 79–97)
Monocytes Absolute: 0.7 10*3/uL (ref 0.1–0.9)
Monocytes: 8 %
Neutrophils Absolute: 6.1 10*3/uL (ref 1.4–7.0)
Neutrophils: 68 %
Platelets: 174 10*3/uL (ref 150–450)
RBC: 4.61 x10E6/uL (ref 4.14–5.80)
RDW: 13.7 % (ref 11.6–15.4)
WBC: 9 10*3/uL (ref 3.4–10.8)

## 2021-07-31 LAB — BASIC METABOLIC PANEL
BUN/Creatinine Ratio: 20 (ref 10–24)
BUN: 26 mg/dL (ref 8–27)
CO2: 21 mmol/L (ref 20–29)
Calcium: 9.2 mg/dL (ref 8.6–10.2)
Chloride: 103 mmol/L (ref 96–106)
Creatinine, Ser: 1.33 mg/dL — ABNORMAL HIGH (ref 0.76–1.27)
Glucose: 127 mg/dL — ABNORMAL HIGH (ref 70–99)
Potassium: 4.6 mmol/L (ref 3.5–5.2)
Sodium: 137 mmol/L (ref 134–144)
eGFR: 56 mL/min/{1.73_m2} — ABNORMAL LOW (ref >59)

## 2021-07-31 LAB — SPECIMEN STATUS REPORT

## 2021-07-31 LAB — ALT: ALT: 19 IU/L (ref 0–44)

## 2021-08-14 ENCOUNTER — Other Ambulatory Visit (HOSPITAL_COMMUNITY): Payer: Self-pay | Admitting: Nurse Practitioner

## 2021-08-18 ENCOUNTER — Ambulatory Visit: Payer: Medicare Other | Admitting: Internal Medicine

## 2021-09-02 ENCOUNTER — Other Ambulatory Visit (HOSPITAL_COMMUNITY): Payer: Self-pay | Admitting: Internal Medicine

## 2021-09-03 ENCOUNTER — Other Ambulatory Visit: Payer: Self-pay | Admitting: Internal Medicine

## 2021-09-03 DIAGNOSIS — I480 Paroxysmal atrial fibrillation: Secondary | ICD-10-CM

## 2021-09-03 NOTE — Telephone Encounter (Signed)
Eliquis 5mg  refill request received. Patient is 76 years old, weight-92.4kg, Crea-1.33 on 07/30/2021, Diagnosis-Afib, and last seen by Roderic Palau on 07/30/2021. Dose is appropriate based on dosing criteria. Will send in refill to requested pharmacy.

## 2021-10-02 ENCOUNTER — Ambulatory Visit: Payer: Medicare Other | Admitting: Internal Medicine

## 2021-10-02 ENCOUNTER — Other Ambulatory Visit: Payer: Self-pay

## 2021-10-02 ENCOUNTER — Encounter: Payer: Self-pay | Admitting: Internal Medicine

## 2021-10-02 VITALS — BP 130/74 | HR 51 | Ht 72.0 in | Wt 205.0 lb

## 2021-10-02 DIAGNOSIS — I428 Other cardiomyopathies: Secondary | ICD-10-CM | POA: Diagnosis not present

## 2021-10-02 DIAGNOSIS — I4819 Other persistent atrial fibrillation: Secondary | ICD-10-CM

## 2021-10-02 DIAGNOSIS — I10 Essential (primary) hypertension: Secondary | ICD-10-CM

## 2021-10-02 NOTE — Progress Notes (Signed)
? ?PCP: [facility] [place] ?Primary Cardiologist: Dr Radford Pax ? ?John Cobb is a 76 y.o. male who presents today for routine electrophysiology followup.  Since his recent afib ablation, the patient reports doing very well.  he denies procedure related complications and is pleased with the results of the procedure.  He has had rare afib events which were relatively short.  Today, he denies symptoms of palpitations, chest pain, shortness of breath,  lower extremity edema, dizziness, presyncope, or syncope.  The patient is otherwise without complaint today.  ? ?Past Medical History:  ?Diagnosis Date  ? Arthritis   ? Benign essential HTN 01/05/2018  ? Blood transfusion   ? As an infant  ? CKD (chronic kidney disease), stage III ([place])   ? Dislocated IOL (intraocular lens), posterior 10/02/2011  ? High cholesterol   ? Hypertension   ? Nonischemic cardiomyopathy ([place]) 04/19/2018  ? Persistent atrial fibrillation ([place])   ? Pneumonia ~ 1978  ? ?Past Surgical History:  ?Procedure Laterality Date  ? ANTERIOR CERVICAL DISCECTOMY  2008  ? APPENDECTOMY    ? "as an infant"  ? ATRIAL FIBRILLATION ABLATION N/A 06/26/2021  ? Procedure: ATRIAL FIBRILLATION ABLATION;  Surgeon: Thompson Grayer, MD;  Location: [facility];  Service: Cardiovascular;  Laterality: N/A;  ? CARDIOVERSION N/A 03/11/2018  ? Procedure: CARDIOVERSION;  Surgeon: [name], MD;  Location: [facility];  Service: Cardiovascular;  Laterality: N/A;  ? CARDIOVERSION N/A 04/21/2018  ? Procedure: CARDIOVERSION;  Surgeon: [name], MD;  Location: [facility];  Service: Cardiovascular;  Laterality: N/A;  ? CATARACT EXTRACTION W/ INTRAOCULAR LENS IMPLANT  08/13/11  ? left  ? CATARACT EXTRACTION W/ INTRAOCULAR LENS IMPLANT  09/03/11  ? right  ? Colonscopy    ? Compound Fractures  06/1980  ? right arm; repaired with 2 plates and 10 pins.  ? FRACTURE SURGERY    ? GAS/FLUID EXCHANGE  10/20/2011  ? Procedure: GAS/FLUID EXCHANGE;   Surgeon: Hayden Pedro, MD;  Location: [facility];  Service: Ophthalmology;  Laterality: Right;  ? HERNIA REPAIR    ? "umbilical; as an infant"  ? INTRAOCULAR LENS EXCHANGE  10/20/11  ? right eye  ? PARS PLANA VITRECTOMY  10/20/2011  ? Procedure: PARS PLANA VITRECTOMY WITH 25G REMOVAL/SUTURE INTRAOCULAR LENS;  Surgeon: Hayden Pedro, MD;  Location: [facility];  Service: Ophthalmology;  Laterality: Right;  ? RIGHT/LEFT HEART CATH AND CORONARY ANGIOGRAPHY N/A 01/20/2018  ? Procedure: RIGHT/LEFT HEART CATH AND CORONARY ANGIOGRAPHY;  Surgeon: Belva Crome, MD;  Location: [facility];  Service: Cardiovascular;  Laterality: N/A;  ? ? ?ROS- all systems are personally reviewed and negatives except as per HPI above ? ?Current Outpatient Medications  ?Medication Sig Dispense Refill  ? alclomethasone (ACLOVATE) 0.05 % cream Apply 1 application topically daily as needed (rash).    ? amoxicillin (AMOXIL) 500 MG capsule Take 500 mg by mouth every 12 (twelve) hours.    ? cetirizine (ZYRTEC) 10 MG tablet Take 10 mg by mouth daily.    ? diltiazem (CARDIZEM) 30 MG tablet TAKE 1 TABLET EVERY 4 HOURS AS NEEDED FOR AF HEART RATE >100 AS LONG AS TOP BP >100. 30 tablet 1  ? dofetilide (TIKOSYN) 250 MCG capsule TAKE 1 CAPSULE TWICE DAILY 180 capsule 3  ? doxazosin (CARDURA) 8 MG tablet TAKE 1 TABLET BY MOUTH EVERYDAY AT BEDTIME 90 tablet 3  ? ELIQUIS 5 MG TABS tablet TAKE 1 TABLET BY MOUTH TWICE A DAY 60  tablet 5  ? EPINEPHrine 0.3 mg/0.3 mL IJ SOAJ injection Inject 0.3 mg into the muscle as needed for anaphylaxis.    ? finasteride (PROSCAR) 5 MG tablet Take 5 mg by mouth daily.    ? furosemide (LASIX) 20 MG tablet TAKE 1 TABLET BY MOUTH EVERY DAY 90 tablet 2  ? losartan (COZAAR) 100 MG tablet Take 100 mg by mouth daily.    ? metoprolol succinate (TOPROL-XL) 25 MG 24 hr tablet Take 0.5 tablets (12.5 mg total) by mouth daily. May take an extra 1/2 tablet for breakthrough afib 90 tablet 2  ? Multiple Vitamin (MULITIVITAMIN WITH MINERALS) TABS  Take 1 tablet by mouth every evening.     ? NON FORMULARY Allergy shots- Takes once a month    ? omeprazole (PRILOSEC) 20 MG capsule Take 20 mg by mouth every morning.    ? pravastatin (PRAVACHOL) 80 MG tablet Take 80 mg by mouth at bedtime.     ? predniSONE (DELTASONE) 10 MG tablet Take 10-20 mg by mouth as directed.    ? tamsulosin (FLOMAX) 0.4 MG CAPS capsule Take 0.4 mg by mouth daily after breakfast.    ? ?No current facility-administered medications for this visit.  ? ? ?Physical Exam: ?Vitals:  ? 10/02/21 1226  ?BP: 130/74  ?Pulse: (!) 51  ?SpO2: 98%  ?Weight: 205 lb (93 kg)  ?Height: 6' (1.829 m)  ? ? ?GEN- The patient is well appearing, alert and oriented x 3 today.   ?Head- normocephalic, atraumatic ?Eyes-  Sclera clear, conjunctiva pink ?Ears- hearing intact ?Oropharynx- clear ?Lungs- Clear to ausculation bilaterally, normal work of breathing ?Heart- Regular rate and rhythm, no murmurs, rubs or gallops, PMI not laterally displaced ?GI- soft, NT, ND, + BS ?Extremities- no clubbing, cyanosis, or edema ? ?EKG tracing ordered today is personally reviewed and shows sinus rhythm ? ?Assessment and Plan: ? ?1. Persistent atrial fibrillation ?Doing well s/p ablation ?chads2vasc score is 3.  He is on eliquis ?continue tikosyn ?I have reviewed Apple Watch transmissions which reveal rare afib events ? ?2. HTN ?Stable ?No change required today ? ?3. Severe OSA ?Compliance with CPAP advised ? ?4. Tachycardia mediated CM ?Previously resolved with sinus ?Update echo ? ? ?Return to see me in 3 months ? ?Thompson Grayer MD, [facility] ?10/02/2021 ?12:39 PM ? ? ? ? ?

## 2021-10-02 NOTE — Patient Instructions (Addendum)
Medication Instructions:  ?Your physician recommends that you continue on your current medications as directed. Please refer to the Current Medication list given to you today. ?*If you need a refill on your cardiac medications before your next appointment, please call your pharmacy* ? ?Lab Work: ?None ordered. ?If you have labs (blood work) drawn today and your tests are completely normal, you will receive your results only by: ?MyChart Message (if you have MyChart) OR ?A paper copy in the mail ?If you have any lab test that is abnormal or we need to change your treatment, we will call you to review the results. ? ?Testing/Procedures: ?None ordered. ? ?Follow-Up: ?At [facility], you and your health needs are our priority.  As part of our continuing mission to provide you with exceptional heart care, we have created designated Provider Care Teams.  These Care Teams include your primary Cardiologist (physician) and Advanced Practice Providers (APPs -  Physician Assistants and Nurse Practitioners) who all work together to provide you with the care you need, when you need it. ? ?Your next appointment:   ?Your physician wants you to follow-up in: 3 months with Dr. Rayann Heman  ? ?January 21, 2022 at 9:30 am ?

## 2021-11-19 ENCOUNTER — Other Ambulatory Visit (HOSPITAL_COMMUNITY): Payer: Self-pay | Admitting: Internal Medicine

## 2021-11-20 ENCOUNTER — Other Ambulatory Visit: Payer: Self-pay | Admitting: Internal Medicine

## 2022-01-21 ENCOUNTER — Encounter: Payer: Self-pay | Admitting: Internal Medicine

## 2022-01-21 ENCOUNTER — Ambulatory Visit: Payer: Medicare Other | Admitting: Internal Medicine

## 2022-01-21 VITALS — BP 164/80 | HR 51 | Ht 72.0 in | Wt 210.8 lb

## 2022-01-21 DIAGNOSIS — I10 Essential (primary) hypertension: Secondary | ICD-10-CM | POA: Diagnosis not present

## 2022-01-21 DIAGNOSIS — G4733 Obstructive sleep apnea (adult) (pediatric): Secondary | ICD-10-CM | POA: Diagnosis not present

## 2022-01-21 DIAGNOSIS — I4819 Other persistent atrial fibrillation: Secondary | ICD-10-CM | POA: Diagnosis not present

## 2022-01-21 LAB — BASIC METABOLIC PANEL
BUN/Creatinine Ratio: 16 (ref 10–24)
BUN: 20 mg/dL (ref 8–27)
CO2: 23 mmol/L (ref 20–29)
Calcium: 9.4 mg/dL (ref 8.6–10.2)
Chloride: 104 mmol/L (ref 96–106)
Creatinine, Ser: 1.28 mg/dL — ABNORMAL HIGH (ref 0.76–1.27)
Glucose: 131 mg/dL — ABNORMAL HIGH (ref 70–99)
Potassium: 4.4 mmol/L (ref 3.5–5.2)
Sodium: 140 mmol/L (ref 134–144)
eGFR: 59 mL/min/{1.73_m2} — ABNORMAL LOW (ref >59)

## 2022-01-21 LAB — MAGNESIUM: Magnesium: 2 mg/dL (ref 1.6–2.3)

## 2022-01-21 NOTE — Progress Notes (Signed)
PCP: [facility] [place] Primary Cardiologist: Dr Radford Pax Primary EP: Dr Rayann Heman  John Cobb is a 76 y.o. male who presents today for routine electrophysiology followup.  Since last being seen in our clinic, the patient reports doing reasonably well.  He is under substantial stress at home.  He recently moved.  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   He reports having afib 1-2 times per month, typically lasting several hours. Today, he denies symptoms of palpitations, chest pain, shortness of breath,  lower extremity edema, dizziness, presyncope, or syncope.  The patient is otherwise without complaint today.   Past Medical History:  Diagnosis Date   Arthritis    Benign essential HTN 01/05/2018   Blood transfusion    As an infant   CKD (chronic kidney disease), stage III ([place])    Dislocated IOL (intraocular lens), posterior 10/02/2011   High cholesterol    Hypertension    Nonischemic cardiomyopathy ([place]) 04/19/2018   Persistent atrial fibrillation ([place])    Pneumonia ~ 1978   Past Surgical History:  Procedure Laterality Date   ANTERIOR CERVICAL DISCECTOMY  2008   APPENDECTOMY     "as an infant"   ATRIAL FIBRILLATION ABLATION N/A 06/26/2021   Procedure: ATRIAL FIBRILLATION ABLATION;  Surgeon: Thompson Grayer, MD;  Location: [facility];  Service: Cardiovascular;  Laterality: N/A;   CARDIOVERSION N/A 03/11/2018   Procedure: CARDIOVERSION;  Surgeon: [name], MD;  Location: [facility];  Service: Cardiovascular;  Laterality: N/A;   CARDIOVERSION N/A 04/21/2018   Procedure: CARDIOVERSION;  Surgeon: [name], MD;  Location: [facility];  Service: Cardiovascular;  Laterality: N/A;   CATARACT EXTRACTION W/ INTRAOCULAR LENS IMPLANT  08/13/11   left   CATARACT EXTRACTION W/ INTRAOCULAR LENS IMPLANT  09/03/11   right   Colonscopy     Compound Fractures  06/1980   right arm; repaired with 2 plates and 10 pins.   FRACTURE SURGERY     GAS/FLUID  EXCHANGE  10/20/2011   Procedure: GAS/FLUID EXCHANGE;  Surgeon: Hayden Pedro, MD;  Location: [facility];  Service: Ophthalmology;  Laterality: Right;   HERNIA REPAIR     "umbilical; as an infant"   INTRAOCULAR LENS EXCHANGE  10/20/11   right eye   PARS PLANA VITRECTOMY  10/20/2011   Procedure: PARS PLANA VITRECTOMY WITH 25G REMOVAL/SUTURE INTRAOCULAR LENS;  Surgeon: Hayden Pedro, MD;  Location: [facility];  Service: Ophthalmology;  Laterality: Right;   RIGHT/LEFT HEART CATH AND CORONARY ANGIOGRAPHY N/A 01/20/2018   Procedure: RIGHT/LEFT HEART CATH AND CORONARY ANGIOGRAPHY;  Surgeon: Belva Crome, MD;  Location: [facility];  Service: Cardiovascular;  Laterality: N/A;    ROS- all systems are reviewed and negatives except as per HPI above  Current Outpatient Medications  Medication Sig Dispense Refill   alclomethasone (ACLOVATE) 0.05 % cream Apply 1 application topically daily as needed (rash).     amoxicillin (AMOXIL) 500 MG capsule Take 500 mg by mouth every 12 (twelve) hours.     cetirizine (ZYRTEC) 10 MG tablet Take 10 mg by mouth daily.     diltiazem (CARDIZEM) 30 MG tablet TAKE 1 TABLET EVERY 4 HOURS AS NEEDED FOR AF HEART RATE >100 AS LONG AS TOP BP >100. 30 tablet 0   dofetilide (TIKOSYN) 250 MCG capsule TAKE 1 CAPSULE BY MOUTH TWICE A DAY 180 capsule 3   doxazosin (CARDURA) 8 MG tablet TAKE 1 TABLET BY MOUTH EVERYDAY AT BEDTIME 90 tablet 3  ELIQUIS 5 MG TABS tablet TAKE 1 TABLET BY MOUTH TWICE A DAY 60 tablet 5   EPINEPHrine 0.3 mg/0.3 mL IJ SOAJ injection Inject 0.3 mg into the muscle as needed for anaphylaxis.     finasteride (PROSCAR) 5 MG tablet Take 5 mg by mouth daily.     furosemide (LASIX) 20 MG tablet TAKE 1 TABLET BY MOUTH EVERY DAY 90 tablet 2   losartan (COZAAR) 100 MG tablet Take 100 mg by mouth daily.     metoprolol succinate (TOPROL-XL) 25 MG 24 hr tablet Take 0.5 tablets (12.5 mg total) by mouth daily. May take an extra 1/2 tablet for breakthrough afib 90 tablet 2    Multiple Vitamin (MULITIVITAMIN WITH MINERALS) TABS Take 1 tablet by mouth every evening.      NON FORMULARY Allergy shots- Takes once a month     omeprazole (PRILOSEC) 20 MG capsule Take 20 mg by mouth every morning.     pravastatin (PRAVACHOL) 80 MG tablet Take 80 mg by mouth at bedtime.      predniSONE (DELTASONE) 10 MG tablet Take 10-20 mg by mouth as directed.     tamsulosin (FLOMAX) 0.4 MG CAPS capsule Take 0.4 mg by mouth daily after breakfast.     No current facility-administered medications for this visit.    Physical Exam: Vitals:   01/21/22 0932  BP: (!) 164/80  Pulse: (!) 51  SpO2: 97%  Weight: 210 lb 12.8 oz (95.6 kg)  Height: 6' (1.829 m)    GEN- The patient is well appearing, alert and oriented x 3 today.   Head- normocephalic, atraumatic Eyes-  Sclera clear, conjunctiva pink Ears- hearing intact Oropharynx- clear Lungs- Clear to ausculation bilaterally, normal work of breathing Heart- Regular rate and rhythm, no murmurs, rubs or gallops, PMI not laterally displaced GI- soft, NT, ND, + BS Extremities- no clubbing, cyanosis, or edema  Wt Readings from Last 3 Encounters:  01/21/22 210 lb 12.8 oz (95.6 kg)  10/02/21 205 lb (93 kg)  07/30/21 203 lb 9.6 oz (92.4 kg)    EKG tracing ordered today is personally reviewed and shows sinus bradycardia 51 bpm, qtc 400 msec  Assessment and Plan:  Persistent afib Doing well s/p ablation Chads2vasc score is 3.  He is on eliquis  Therapeutic strategies for afib including medicine (switching to amiodarone) and repeat ablation were discussed in detail with the patient today. Risk, benefits, and alternatives to each approach were discussed today.  He wishes to continue tikosyn for now but would lean towards redo ablation. Labs 1/23 reviewed bmet, mg ordered today  2. HTN Elevated today  He reports better control at home and does not wish to make changes  3. Severe OSA Compliance with CPAP advised  4. Tachycardia  mediated CM Resolved with sinus Repeat echo on return  Risks, benefits and potential toxicities for medications prescribed and/or refilled reviewed with patient today.   Follow-up with Dr Myles Gip in 3 months to consider repeat ablation.  Thompson Grayer MD, [facility] 01/21/2022 9:43 AM

## 2022-01-21 NOTE — Patient Instructions (Addendum)
Medication Instructions:  Your physician recommends that you continue on your current medications as directed. Please refer to the Current Medication list given to you today. *If you need a refill on your cardiac medications before your next appointment, please call your pharmacy*  Lab Work: You will have blood work drawn today:  BMET, Magnesium   Testing/Procedures: None ordered.  Follow-Up: At [facility], you and your health needs are our priority.  As part of our continuing mission to provide you with exceptional heart care, we have created designated Provider Care Teams.  These Care Teams include your primary Cardiologist (physician) and Advanced Practice Providers (APPs -  Physician Assistants and Nurse Practitioners) who all work together to provide you with the care you need, when you need it.  Your next appointment:   Your physician wants you to follow-up in 3 months with Dr. Kaleen Odea  You will receive a reminder letter in the mail two months in advance. If you don't receive a letter, please call our office to schedule the follow-up appointment.   Important Information About Sugar

## 2022-01-21 NOTE — Addendum Note (Signed)
Addended by: Gwendlyn Deutscher on: 01/21/2022 03:00 PM   Modules accepted: Orders

## 2022-03-03 ENCOUNTER — Other Ambulatory Visit: Payer: Self-pay | Admitting: Internal Medicine

## 2022-03-03 DIAGNOSIS — I480 Paroxysmal atrial fibrillation: Secondary | ICD-10-CM

## 2022-03-03 NOTE — Telephone Encounter (Signed)
Eliquis '5mg'$  refill request received. Patient is 76 years old, weight-95.6kg, Crea-1.28 on 01/21/2022, Diagnosis-Afib, and last seen by Dr. Rayann Heman on 01/21/2022. Dose is appropriate based on dosing criteria. Will send in refill to requested pharmacy.

## 2022-03-18 ENCOUNTER — Other Ambulatory Visit: Payer: Self-pay | Admitting: Internal Medicine

## 2022-04-30 ENCOUNTER — Ambulatory Visit: Payer: Medicare Other | Admitting: Cardiovascular Disease

## 2022-05-04 ENCOUNTER — Ambulatory Visit: Payer: Medicare Other | Attending: Cardiovascular Disease | Admitting: Cardiovascular Disease

## 2022-05-04 ENCOUNTER — Encounter: Payer: Self-pay | Admitting: Cardiovascular Disease

## 2022-05-04 VITALS — BP 180/92 | HR 50 | Ht 71.5 in | Wt 210.4 lb

## 2022-05-04 DIAGNOSIS — I480 Paroxysmal atrial fibrillation: Secondary | ICD-10-CM | POA: Diagnosis not present

## 2022-05-04 DIAGNOSIS — I428 Other cardiomyopathies: Secondary | ICD-10-CM

## 2022-05-04 DIAGNOSIS — Z5181 Encounter for therapeutic drug level monitoring: Secondary | ICD-10-CM

## 2022-05-04 DIAGNOSIS — I5022 Chronic systolic (congestive) heart failure: Secondary | ICD-10-CM

## 2022-05-04 DIAGNOSIS — I10 Essential (primary) hypertension: Secondary | ICD-10-CM

## 2022-05-04 DIAGNOSIS — I4819 Other persistent atrial fibrillation: Secondary | ICD-10-CM

## 2022-05-04 DIAGNOSIS — Z79899 Other long term (current) drug therapy: Secondary | ICD-10-CM

## 2022-05-04 DIAGNOSIS — D6869 Other thrombophilia: Secondary | ICD-10-CM

## 2022-05-04 MED ORDER — AMLODIPINE BESYLATE 5 MG PO TABS: 5.0000 mg | ORAL_TABLET | Freq: Every day | ORAL | 3 refills | Status: DC

## 2022-05-04 NOTE — Progress Notes (Signed)
Cardiology Office Note:    Date:  05/04/2022   ID:  John Cobb, DOB 1946/02/22, MRN [id number]  PCP:  [facility] [place] Providers Cardiologist:  Fransico Him, MD Electrophysiologist:  Melida Quitter, MD     Referring MD: [facility]   Chief complaint: follow-up for atrial fibrillation  History of Present Illness:    John Cobb is a 76 y.o. male with a hx of HTN, HLD, NICM amd atrial fibrillation who presents for routine electrophysiology follow-up.  He underwent AF ablation by Dr. Rayann Heman in December, 2022. He has been maintained on Tikosyn. He has had a few breakthrough episodes in times of increased stress. The last occurred over a month ago.  Past Medical History:  Diagnosis Date   Arthritis    Benign essential HTN 01/05/2018   Blood transfusion    As an infant   CKD (chronic kidney disease), stage III ([place])    Dislocated IOL (intraocular lens), posterior 10/02/2011   High cholesterol    Hypertension    Nonischemic cardiomyopathy ([place]) 04/19/2018   Persistent atrial fibrillation ([place])    Pneumonia ~ 1978    Past Surgical History:  Procedure Laterality Date   ANTERIOR CERVICAL DISCECTOMY  2008   APPENDECTOMY     "as an infant"   ATRIAL FIBRILLATION ABLATION N/A 06/26/2021   Procedure: ATRIAL FIBRILLATION ABLATION;  Surgeon: Thompson Grayer, MD;  Location: [facility];  Service: Cardiovascular;  Laterality: N/A;   CARDIOVERSION N/A 03/11/2018   Procedure: CARDIOVERSION;  Surgeon: [name], MD;  Location: [facility] ENDOSCOPY;  Service: Cardiovascular;  Laterality: N/A;   CARDIOVERSION N/A 04/21/2018   Procedure: CARDIOVERSION;  Surgeon: [name], MD;  Location: [place];  Service: Cardiovascular;  Laterality: N/A;   CATARACT EXTRACTION W/ INTRAOCULAR LENS IMPLANT  08/13/11   left   CATARACT EXTRACTION W/ INTRAOCULAR LENS IMPLANT  09/03/11   right   Colonscopy     Compound  Fractures  06/1980   right arm; repaired with 2 plates and 10 pins.   FRACTURE SURGERY     GAS/FLUID EXCHANGE  10/20/2011   Procedure: GAS/FLUID EXCHANGE;  Surgeon: Hayden Pedro, MD;  Location: [place];  Service: Ophthalmology;  Laterality: Right;   HERNIA REPAIR     "umbilical; as an infant"   INTRAOCULAR LENS EXCHANGE  10/20/11   right eye   PARS PLANA VITRECTOMY  10/20/2011   Procedure: PARS PLANA VITRECTOMY WITH 25G REMOVAL/SUTURE INTRAOCULAR LENS;  Surgeon: Hayden Pedro, MD;  Location: [place];  Service: Ophthalmology;  Laterality: Right;   RIGHT/LEFT HEART CATH AND CORONARY ANGIOGRAPHY N/A 01/20/2018   Procedure: RIGHT/LEFT HEART CATH AND CORONARY ANGIOGRAPHY;  Surgeon: Belva Crome, MD;  Location: [place] [facility];  Service: Cardiovascular;  Laterality: N/A;    Current Medications: No outpatient medications have been marked as taking for the 05/04/22 encounter (Appointment) with Anaisa Radi, Yetta Barre, MD.     Allergies:   Honey bee treatment [bee venom], Wasp venom, Neosporin [neomycin-bacitracin zn-polymyx], Nsaids, Contrast media [iodinated contrast media], and Neo-bacit-poly-lidocaine   Social History   Socioeconomic History   Marital status: Married    Spouse name: Not on file   Number of children: Not on file   Years of education: Not on file   Highest education level: Not on file  Occupational History   Not on file  Tobacco Use   Smoking status: Never   Smokeless tobacco: Former  Types: Sarina Ser    Quit date: 07/13/2008  Vaping Use   Vaping Use: Never used  Substance and Sexual Activity   Alcohol use: Not Currently    Alcohol/week: 1.0 standard drink of alcohol    Types: 1 Cans of beer per week    Comment: out to eat   Drug use: No   Sexual activity: Not Currently  Other Topics Concern   Not on file  Social History Narrative   Not on file   Social Determinants of Health   Financial Resource Strain: Not on file  Food Insecurity: Not on file  Transportation  Needs: Not on file  Physical Activity: Not on file  Stress: Not on file  Social Connections: Not on file     Family History: The patient's family history includes Heart attack in his father; Lung cancer in his mother; Stomach cancer in his mother. There is no history of Anesthesia problems.  ROS:   Please see the history of present illness.    All other systems reviewed and are negative.  EKGs/Labs/Other Studies Reviewed:      EKG:  Last EKG results: Sinus bradycardia, 50 bpm   Recent Labs: 07/30/2021: ALT 19; Hemoglobin 13.8; Platelets 174 01/21/2022: BUN 20; Creatinine, Ser 1.28; Magnesium 2.0; Potassium 4.4; Sodium 140    Physical Exam:    VS:  There were no vitals taken for this visit.    Wt Readings from Last 3 Encounters:  01/21/22 210 lb 12.8 oz (95.6 kg)  10/02/21 205 lb (93 kg)  07/30/21 203 lb 9.6 oz (92.4 kg)     GEN:  Well nourished, well developed in no acute distress CARDIAC: RRR, no murmurs, rubs, gallops RESPIRATORY:  Normal work of breathing MUSCULOSKELETAL:  edema    ASSESSMENT & PLAN:    Atrial fibrillation: appears to be maintaining sinus well s/p ablation on Tikosyn. Qtc today is appropriate to continue Tikosyn. High risk medications: Tikosyn. Qtc ok. Continue semiannual follow-up. Hypertension. BP elevated today. Will start amlodipine '5mg'$  Secondary hypercoagulable state: CHADS2Vasc is 3          Medication Adjustments/Labs and Tests Ordered: Current medicines are reviewed at length with the patient today.  Concerns regarding medicines are outlined above.  No orders of the defined types were placed in this encounter.  No orders of the defined types were placed in this encounter.    Signed, Melida Quitter, MD  05/04/2022 9:49 AM    [place]

## 2022-05-04 NOTE — Patient Instructions (Signed)
Medication Instructions:  Your physician has recommended you make the following change in your medication:  1) START taking amlodipine 5 mg daily  *If you need a refill on your cardiac medications before your next appointment, please call your pharmacy*   Follow-Up: At [facility], you and your health needs are our priority.  As part of our continuing mission to provide you with exceptional heart care, we have created designated Provider Care Teams.  These Care Teams include your primary Cardiologist (physician) and Advanced Practice Providers (APPs -  Physician Assistants and Nurse Practitioners) who all work together to provide you with the care you need, when you need it.  Your next appointment:   6 month(s)  The format for your next appointment:   In Person  Provider:   You may see Melida Quitter, MD or one of the following Advanced Practice Providers on your designated Care Team:   Tommye Standard, [place] Legrand Como "Jonni Sanger" Chalmers Cater, [place]     Important Information About Sugar

## 2022-07-14 ENCOUNTER — Other Ambulatory Visit: Payer: Self-pay | Admitting: Internal Medicine

## 2022-08-14 ENCOUNTER — Telehealth: Payer: Self-pay | Admitting: Cardiovascular Disease

## 2022-08-14 MED ORDER — DILTIAZEM HCL 30 MG PO TABS
ORAL_TABLET | ORAL | 9 refills | Status: AC
Start: 2022-08-14 — End: ?

## 2022-08-14 NOTE — Telephone Encounter (Signed)
*  STAT* If patient is at the pharmacy, call can be transferred to refill team.   1. Which medications need to be refilled? (please list name of each medication and dose if known) diltiazem (CARDIZEM) 30 MG tablet  2. Which pharmacy/location (including street and city if local pharmacy) is medication to be sent to? [id number] [place], [place] - [address]   3. Do they need a 30 day or 90 day supply? [address]

## 2022-08-14 NOTE — Telephone Encounter (Signed)
Pt's medication was sent to pt's pharmacy as requested. Confirmation received.  °

## 2022-08-20 ENCOUNTER — Encounter (HOSPITAL_COMMUNITY): Payer: Self-pay | Admitting: *Deleted

## 2022-10-05 ENCOUNTER — Ambulatory Visit: Payer: Medicare Other | Admitting: Cardiology

## 2022-10-17 ENCOUNTER — Telehealth: Payer: Self-pay | Admitting: Physician Assistant

## 2022-10-17 MED ORDER — DOFETILIDE 250 MCG PO CAPS: 250.0000 ug | ORAL_CAPSULE | Freq: Two times a day (BID) | ORAL | 3 refills | Status: DC

## 2022-10-17 NOTE — Telephone Encounter (Signed)
   The patient called the answering service after-hours today.  He is in need of refill of Tikosyn - has not run out, reports adherence to rx taking 1 capsule PO BID, but will be out starting on Monday. He indicates [facility] [place] who reports they will be able to provide him with this, just needs refill sent in. D/w Dr. Nelly Laurence. Up to date on last visit 04/2022, QTc appropriate. Has upcoming f/u later this month with EP APP. Refill sent in for 1 year. The patient will notify if there is any issue filling the rx since it is important that he not run out. He verbalized understanding and gratitude.  Laurann [place], PA-C

## 2022-11-01 NOTE — Progress Notes (Unsigned)
Cardiology Office Note Date:  11/02/2022  Patient ID:  John Cobb, John Cobb 09/27/1945, MRN [id number] PCP:  [facility] [place]  Cardiologist:  Dr. Mayford Knife (OSA) Electrophysiologist: Dr. Nelly Laurence    Chief Complaint:  6 mo  History of Present Illness: John Cobb is a 77 y.o. male with history of HTN, HLD, NICM, AFib, CKD (III)  He saw Dr. Nelly Laurence 05/04/22, doing well, stable QTc, planned 6 mo visit  TODAY His AFib had been well controlled until Dec or so when he got Covid.  Since then he has been having intermittent episodes. Lasting a few hours to a few days. Occurring avery couple weeks, though has been 2-3 weeks now since his last episode, using prn Dilt and metoprolol (he does not take metoprolol daily as ordered)  When in Afib, he feels fatigued, no energy.  When not in AFib he feels quite well.  No CP, SOB, DOE He reports good medication compliance No near syncope or syncope. No bleeding or signs of bleeding   AFib/AAD hx Diagnosed 2019 Tikosyn started 2019 PVI ablation 06/26/21, Dr. Johney Frame   Past Medical History:  Diagnosis Date   Arthritis    Benign essential HTN 01/05/2018   Blood transfusion    As an infant   CKD (chronic kidney disease), stage III    Dislocated IOL (intraocular lens), posterior 10/02/2011   High cholesterol    Hypertension    Nonischemic cardiomyopathy 04/19/2018   Persistent atrial fibrillation    Pneumonia ~ 1978    Past Surgical History:  Procedure Laterality Date   ANTERIOR CERVICAL DISCECTOMY  2008   APPENDECTOMY     "as an infant"   ATRIAL FIBRILLATION ABLATION N/A 06/26/2021   Procedure: ATRIAL FIBRILLATION ABLATION;  Surgeon: Hillis Range, MD;  [facility];  Service: Cardiovascular;  Laterality: N/A;   CARDIOVERSION N/A 03/11/2018   Procedure: CARDIOVERSION;  Surgeon: Chilton Si, MD;  Location: [facility] ENDOSCOPY;  Service: Cardiovascular;  Laterality: N/A;   CARDIOVERSION N/A  04/21/2018   Procedure: CARDIOVERSION;  Surgeon: Chilton Si, MD;  Location: [facility] ENDOSCOPY;  Service: Cardiovascular;  Laterality: N/A;   CATARACT EXTRACTION W/ INTRAOCULAR LENS IMPLANT  08/13/11   left   CATARACT EXTRACTION W/ INTRAOCULAR LENS IMPLANT  09/03/11   right   Colonscopy     Compound Fractures  06/1980   right arm; repaired with 2 plates and 10 pins.   FRACTURE SURGERY     GAS/FLUID EXCHANGE  10/20/2011   Procedure: GAS/FLUID EXCHANGE;  Surgeon: Sherrie George, MD;  Location: [facility] OR;  Service: Ophthalmology;  Laterality: Right;   HERNIA REPAIR     "umbilical; as an infant"   INTRAOCULAR LENS EXCHANGE  10/20/11   right eye   PARS PLANA VITRECTOMY  10/20/2011   Procedure: PARS PLANA VITRECTOMY WITH 25G REMOVAL/SUTURE INTRAOCULAR LENS;  Surgeon: Sherrie George, MD;  Location: [facility] OR;  Service: Ophthalmology;  Laterality: Right;   RIGHT/LEFT HEART CATH AND CORONARY ANGIOGRAPHY N/A 01/20/2018   Procedure: RIGHT/LEFT HEART CATH AND CORONARY ANGIOGRAPHY;  Surgeon: Lyn Records, MD;  [facility]: [facility];  Service: Cardiovascular;  Laterality: N/A;    Current Outpatient Medications  Medication Sig Dispense Refill   alclomethasone (ACLOVATE) 0.05 % cream Apply 1 application topically daily as needed (rash).     amLODipine (NORVASC) 10 MG tablet Take 10 mg by mouth daily.     cetirizine (ZYRTEC) 10 MG tablet Take 10 mg by mouth  daily.     diltiazem (CARDIZEM) 30 MG tablet TAKE 1 TABLET EVERY 4 HOURS AS NEEDED FOR AF HEART RATE >100 AS LONG AS TOP BP >100. 30 tablet 9   doxazosin (CARDURA) 8 MG tablet TAKE 1 TABLET BY MOUTH EVERYDAY AT BEDTIME 90 tablet 1   ELIQUIS 5 MG TABS tablet TAKE 1 TABLET BY MOUTH TWICE A DAY 60 tablet 5   EPINEPHrine 0.3 mg/0.3 mL IJ SOAJ injection Inject 0.3 mg into the muscle as needed for anaphylaxis.     finasteride (PROSCAR) 5 MG tablet Take 5 mg by mouth daily.     furosemide (LASIX) 20 MG tablet TAKE 1 TABLET BY MOUTH EVERY DAY 90 tablet 3    losartan (COZAAR) 100 MG tablet Take 100 mg by mouth daily.     metoprolol succinate (TOPROL-XL) 25 MG 24 hr tablet Take 0.5 tablets (12.5 mg total) by mouth daily. May take an extra 1/2 tablet for breakthrough afib (Patient taking differently: Take 25 mg by mouth daily. 1  tablet  prn for breakthrough afib) 90 tablet 2   Multiple Vitamin (MULITIVITAMIN WITH MINERALS) TABS Take 1 tablet by mouth every evening.      NON FORMULARY Allergy shots- Takes once a month     omeprazole (PRILOSEC) 20 MG capsule Take 20 mg by mouth every morning.     pravastatin (PRAVACHOL) 80 MG tablet Take 80 mg by mouth at bedtime.      tamsulosin (FLOMAX) 0.4 MG CAPS capsule Take 0.4 mg by mouth daily after breakfast.     dofetilide (TIKOSYN) 250 MCG capsule Take 1 capsule (250 mcg total) by mouth 2 (two) times daily. 180 capsule 3   No current facility-administered medications for this visit.    Allergies:   Honey bee treatment [bee venom], Wasp venom, Neosporin [neomycin-bacitracin zn-polymyx], Nsaids, Contrast media [iodinated contrast media], and Neo-bacit-poly-lidocaine   Social History:  The patient  reports that he has never smoked. He quit smokeless tobacco use about 14 years ago.  His smokeless tobacco use included chew. He reports that he does not currently use alcohol after a past usage of about 1.0 standard drink of alcohol per week. He reports that he does not use drugs.   Family History:  The patient's family history includes Heart attack in his father; Lung cancer in his mother; Stomach cancer in his mother.  ROS:  Please see the history of present illness.    All other systems are reviewed and otherwise negative.   PHYSICAL EXAM:  VS:  BP 114/66   Ht 5' 11.5" (1.816 m)   Wt 207 lb 3.2 oz (94 kg)   SpO2 97%   BMI 28.50 kg/m  BMI: Body mass index is 28.5 kg/m. Well nourished, well developed, in no acute distress HEENT: normocephalic, atraumatic Neck: no JVD, carotid bruits or masses Cardiac:   RRR; no significant murmurs, no rubs, or gallops Lungs:  CTA b/l, no wheezing, rhonchi or rales Abd: soft, nontender MS: no deformity or atrophy Ext:  no edema Skin: warm and dry, no rash Neuro:  No gross deficits appreciated Psych: euthymic mood, full affect   EKG:  Done today and reviewed by myself shows  SR 70bpm, QTc   06/26/21: EPS/ablation CONCLUSIONS: 1. Sinus rhythm upon presentation.   2. Intracardiac echo reveals a moderate sized left atrium with four separate pulmonary veins without evidence of pulmonary vein stenosis. 3. Successful electrical isolation and anatomical encircling of all four pulmonary veins with radiofrequency current.  A WACA approach was used 3. No early apparent complications.  08/24/2018: TTE 1. The left ventricle has normal systolic function, with an ejection  fraction of 55-60%. The cavity size was normal. There is mild concentric  left ventricular hypertrophy. Left ventricular diastolic Doppler  parameters are consistent with impaired  relaxation.   2. The right ventricle has normal systolic function. The cavity was  normal. There is no increase in right ventricular wall thickness.   3. The mitral valve is normal in structure.   4. The tricuspid valve is normal in structure.   5. The aortic valve is tricuspid There is mild thickening and mild  calcification of the aortic valve. Aortic valve regurgitation is trivial  by color flow Doppler.   6. The pulmonic valve was normal in structure.   7. Right atrial pressure is estimated at 3 mmHg.    01/20/2018: R/LHC Normal left main Proximal to mid LAD 30% eccentric narrowing at the bifurcation with a large diagonal.  The first diagonal contains 30% eccentric stenosis.  The Circumflex origin to 2 obtuse marginal branches, both of which are widely patent. RCA is not well-visualized.  Ostial and mid vessel luminal irregularities.  No significant obstruction. Normal pulmonary pressures.  LVEDP 17  mmHg.  Pulmonary capillary wedge mean is 18 to 20 mmHg. Nonischemic cardiomyopathy, possibly related to atrial fibrillation and tachycardia induced mechanism.    Recent Labs: 01/21/2022: BUN 20; Creatinine, Ser 1.28; Magnesium 2.0; Potassium 4.4; Sodium 140  No results found for requested labs within last 365 days.   CrCl cannot be calculated (Patient's most recent lab result is older than the maximum 21 days allowed.).   Wt Readings from Last 3 Encounters:  11/02/22 207 lb 3.2 oz (94 kg)  05/04/22 210 lb 6.4 oz (95.4 kg)  01/21/22 210 lb 12.8 oz (95.6 kg)     Other studies reviewed: Additional studies/records reviewed today include: summarized above  ASSESSMENT AND PLAN:  Persistent AFib CHA2DS2Vasc is 4, on eliquis, appropriately dosed Tikosyn, stable QTc Increase in burden since December/Covid infection  For now, he would like to monitor his AG buden without change, if no improvement would perhaps like to consider a repeat ablation.  Labs today  NICM Recovered LVEF No symptoms or exam findings of volume OL  HTN Looks good  Secondary hypercoagulable state    Disposition: F/u with Dr. Nelly Laurence in a few months, sooner if needed  Current medicines are reviewed at length with the patient today.  The patient did not have any concerns regarding medicines.  Norma Fredrickson, PA-C 11/02/2022 4:54 PM     [facility] [address] [place] [address] [phone] (office)  [phone] (fax)

## 2022-11-02 ENCOUNTER — Ambulatory Visit: Payer: Medicare Other | Attending: Physician Assistant | Admitting: Physician Assistant

## 2022-11-02 ENCOUNTER — Encounter: Payer: Self-pay | Admitting: Physician Assistant

## 2022-11-02 VITALS — BP 114/66 | Ht 71.5 in | Wt 207.2 lb

## 2022-11-02 DIAGNOSIS — I428 Other cardiomyopathies: Secondary | ICD-10-CM

## 2022-11-02 DIAGNOSIS — Z79899 Other long term (current) drug therapy: Secondary | ICD-10-CM

## 2022-11-02 DIAGNOSIS — Z5181 Encounter for therapeutic drug level monitoring: Secondary | ICD-10-CM | POA: Diagnosis not present

## 2022-11-02 DIAGNOSIS — I4819 Other persistent atrial fibrillation: Secondary | ICD-10-CM | POA: Diagnosis not present

## 2022-11-02 DIAGNOSIS — D6869 Other thrombophilia: Secondary | ICD-10-CM

## 2022-11-02 MED ORDER — DOFETILIDE 250 MCG PO CAPS: 250.0000 ug | ORAL_CAPSULE | Freq: Two times a day (BID) | ORAL | 3 refills | Status: DC

## 2022-11-02 NOTE — Patient Instructions (Addendum)
Medication Instructions:   Your physician recommends that you continue on your current medications as directed. Please refer to the Current Medication list given to you today.  *If you need a refill on your cardiac medications before your next appointment, please call your pharmacy*   Lab Work:  BMET  MAG AND CBC   If you have labs (blood work) drawn today and your tests are completely normal, you will receive your results only by: MyChart Message (if you have MyChart) OR A paper copy in the mail If you have any lab test that is abnormal or we need to change your treatment, we will call you to review the results.   Testing/Procedures:  NONE ORDERED  TODAY     Follow-Up: At [facility], you and your health needs are our priority.  As part of our continuing mission to provide you with exceptional heart care, we have created designated Provider Care Teams.  These Care Teams include your primary Cardiologist (physician) and Advanced Practice Providers (APPs -  Physician Assistants and Nurse Practitioners) who all work together to provide you with the care you need, when you need it.  We recommend signing up for the patient portal called "MyChart".  Sign up information is provided on this After Visit Summary.  MyChart is used to connect with patients for Virtual Visits (Telemedicine).  Patients are able to view lab/test results, encounter notes, upcoming appointments, etc.  Non-urgent messages can be sent to your provider as well.   To learn more about what you can do with MyChart, go to [email or web address].    Your next appointment:   3 -4 month(s)  Provider:   York Pellant, MD    Other Instructions

## 2022-11-03 ENCOUNTER — Other Ambulatory Visit: Payer: Self-pay | Admitting: *Deleted

## 2022-11-03 DIAGNOSIS — Z79899 Other long term (current) drug therapy: Secondary | ICD-10-CM

## 2022-11-03 LAB — CBC
Hematocrit: 42.1 % (ref 37.5–51.0)
Hemoglobin: 13.9 g/dL (ref 13.0–17.7)
MCH: 29.7 pg (ref 26.6–33.0)
MCHC: 33 g/dL (ref 31.5–35.7)
MCV: 90 fL (ref 79–97)
Platelets: 104 10*3/uL — ABNORMAL LOW (ref 150–450)
RBC: 4.68 x10E6/uL (ref 4.14–5.80)
RDW: 13.2 % (ref 11.6–15.4)
WBC: 7.6 10*3/uL (ref 3.4–10.8)

## 2022-11-03 LAB — BASIC METABOLIC PANEL
BUN/Creatinine Ratio: 14 (ref 10–24)
BUN: 18 mg/dL (ref 8–27)
CO2: 22 mmol/L (ref 20–29)
Calcium: 9.2 mg/dL (ref 8.6–10.2)
Chloride: 104 mmol/L (ref 96–106)
Creatinine, Ser: 1.31 mg/dL — ABNORMAL HIGH (ref 0.76–1.27)
Glucose: 108 mg/dL — ABNORMAL HIGH (ref 70–99)
Potassium: 4.2 mmol/L (ref 3.5–5.2)
Sodium: 143 mmol/L (ref 134–144)
eGFR: 57 mL/min/{1.73_m2} — ABNORMAL LOW (ref >59)

## 2022-11-03 LAB — MAGNESIUM: Magnesium: 2.1 mg/dL (ref 1.6–2.3)

## 2022-11-13 ENCOUNTER — Telehealth: Payer: Self-pay | Admitting: Cardiology

## 2022-11-13 DIAGNOSIS — I480 Paroxysmal atrial fibrillation: Secondary | ICD-10-CM

## 2022-11-13 MED ORDER — APIXABAN 5 MG PO TABS: 5.0000 mg | ORAL_TABLET | Freq: Two times a day (BID) | ORAL | 1 refills | Status: DC

## 2022-11-13 NOTE — Telephone Encounter (Signed)
*  STAT* If patient is at the pharmacy, call can be transferred to refill team.   1. Which medications need to be refilled? (please list name of each medication and dose if known)   ELIQUIS 5 MG TABS tablet   2. Which pharmacy/location (including street and city if local pharmacy) is medication to be sent to?  [id number] - [place], [place] - [address]   3. Do they need a 30 day or 90 day supply?   90 day  Patient stated he has enough medication to last until Wednesday (5/8).

## 2022-11-13 NOTE — Telephone Encounter (Signed)
Prescription refill request for Eliquis received. Indication: Afib  Last office visit:  11/02/22 Keitha Butte)  Scr: 1.31 (11/02/22)  Age: 77 Weight: 94kg  Appropriate dose. Refill sent.

## 2022-11-20 ENCOUNTER — Encounter: Payer: Self-pay | Admitting: Cardiology

## 2022-11-20 ENCOUNTER — Telehealth: Payer: Self-pay | Admitting: *Deleted

## 2022-11-20 ENCOUNTER — Ambulatory Visit: Payer: Medicare Other | Attending: Cardiology | Admitting: Cardiology

## 2022-11-20 ENCOUNTER — Ambulatory Visit: Payer: Medicare Other

## 2022-11-20 VITALS — BP 146/66 | HR 82 | Ht 71.5 in | Wt 207.0 lb

## 2022-11-20 DIAGNOSIS — I480 Paroxysmal atrial fibrillation: Secondary | ICD-10-CM | POA: Diagnosis not present

## 2022-11-20 DIAGNOSIS — E785 Hyperlipidemia, unspecified: Secondary | ICD-10-CM

## 2022-11-20 DIAGNOSIS — I2583 Coronary atherosclerosis due to lipid rich plaque: Secondary | ICD-10-CM

## 2022-11-20 DIAGNOSIS — G4733 Obstructive sleep apnea (adult) (pediatric): Secondary | ICD-10-CM

## 2022-11-20 DIAGNOSIS — I428 Other cardiomyopathies: Secondary | ICD-10-CM

## 2022-11-20 DIAGNOSIS — I2510 Atherosclerotic heart disease of native coronary artery without angina pectoris: Secondary | ICD-10-CM

## 2022-11-20 DIAGNOSIS — I5022 Chronic systolic (congestive) heart failure: Secondary | ICD-10-CM

## 2022-11-20 DIAGNOSIS — I10 Essential (primary) hypertension: Secondary | ICD-10-CM

## 2022-11-20 DIAGNOSIS — Z79899 Other long term (current) drug therapy: Secondary | ICD-10-CM

## 2022-11-20 LAB — CBC
Hematocrit: 40 % (ref 37.5–51.0)
Hemoglobin: 13.2 g/dL (ref 13.0–17.7)
MCH: 29.5 pg (ref 26.6–33.0)
MCHC: 33 g/dL (ref 31.5–35.7)
MCV: 90 fL (ref 79–97)
Platelets: 177 10*3/uL (ref 150–450)
RBC: 4.47 x10E6/uL (ref 4.14–5.80)
RDW: 14.5 % (ref 11.6–15.4)
WBC: 6 10*3/uL (ref 3.4–10.8)

## 2022-11-20 NOTE — Progress Notes (Signed)
Date:  11/20/2022   ID:  John Cobb, DOB 09-Apr-1946, MRN [id number] The patient was identified using 2 identifiers.  PCP:  [facility] Providers Cardiologist:  Armanda Magic, MD Electrophysiologist:  Maurice Small, MD     Evaluation Performed:  Follow-Up Visit  Chief Complaint:  OSA, CAD, HTN, HLD, PAF  History of Present Illness:    John Cobb is a 77 y.o. male with a history of hyperlipidemia, hypertension and PAF followed by Dr. Johney Frame and afib clinic.  He has had 2 DCCV in 2019 as well as a cardiac cath in 2019 showing mild ASCAD with 30% mLAD at the bifurcation of a large Diagonal with 30% pD1 and otherwise ostial and mid RCA luminal irregularities. LVEDP was elevated as well as PCW at 18-75mmHg and was felt to have a NICM possibly related to tachy medicated process from afib.  He eventually was placed on Tikosyn for maintenance of NSR.  He recently underwent afib ablation 06/2021 for frequent bouts of PAF.     He was referred for sleep study and underwent PSG showing severe OSA with an AHI of 41.5/hr, Central Sleep apnea with CSI 8.4/hr and nocturnal hypoxemia with O2 sats as low as 83%.  He had moderate snoring.  He underwent CPAP titration to 10cm H2O.    He is here today for followup and is doing well.  He denies any chest pain or pressure, SOB, DOE, PND, orthopnea, LE edema, dizziness or syncope. He has intermittent breakthrough of afib. He is compliant with his meds and is tolerating meds with no SE.    He is doing well with his PAP device and thinks that he has gotten used to it.  He tolerates the full face mask and feels the pressure is adequate.  Since going on PAP he feels rested in the am and has no significant daytime sleepiness.  He denies any significant mouth or nasal dryness or nasal congestion.  He does not think that he snores.    Past Medical History:  Diagnosis Date   Arthritis    Benign essential  HTN 01/05/2018   Blood transfusion    As an infant   CKD (chronic kidney disease), stage III (HCC)    Dislocated IOL (intraocular lens), posterior 10/02/2011   High cholesterol    Hypertension    Nonischemic cardiomyopathy (HCC) 04/19/2018   Persistent atrial fibrillation (HCC)    Pneumonia ~ 1978   Past Surgical History:  Procedure Laterality Date   ANTERIOR CERVICAL DISCECTOMY  2008   APPENDECTOMY     "as an infant"   ATRIAL FIBRILLATION ABLATION N/A 06/26/2021   Procedure: ATRIAL FIBRILLATION ABLATION;  Surgeon: Hillis Range, MD;  Location: [facility];  Service: Cardiovascular;  Laterality: N/A;   CARDIOVERSION N/A 03/11/2018   Procedure: CARDIOVERSION;  Surgeon: Chilton Si, MD;  Location: [facility] ENDOSCOPY;  Service: Cardiovascular;  Laterality: N/A;   CARDIOVERSION N/A 04/21/2018   Procedure: CARDIOVERSION;  Surgeon: Chilton Si, MD;  Location: [facility] ENDOSCOPY;  Service: Cardiovascular;  Laterality: N/A;   CATARACT EXTRACTION W/ INTRAOCULAR LENS IMPLANT  08/13/11   left   CATARACT EXTRACTION W/ INTRAOCULAR LENS IMPLANT  09/03/11   right   Colonscopy     Compound Fractures  06/1980   right arm; repaired with 2 plates and 10 pins.   FRACTURE SURGERY     GAS/FLUID EXCHANGE  10/20/2011   Procedure: GAS/FLUID EXCHANGE;  Surgeon:  Sherrie George, MD;  Location: [facility] OR;  Service: Ophthalmology;  Laterality: Right;   HERNIA REPAIR     "umbilical; as an infant"   INTRAOCULAR LENS EXCHANGE  10/20/11   right eye   PARS PLANA VITRECTOMY  10/20/2011   Procedure: PARS PLANA VITRECTOMY WITH 25G REMOVAL/SUTURE INTRAOCULAR LENS;  Surgeon: Sherrie George, MD;  Location: [facility] OR;  Service: Ophthalmology;  Laterality: Right;   RIGHT/LEFT HEART CATH AND CORONARY ANGIOGRAPHY N/A 01/20/2018   Procedure: RIGHT/LEFT HEART CATH AND CORONARY ANGIOGRAPHY;  Surgeon: Lyn Records, MD;  Location: [facility];  Service: Cardiovascular;  Laterality: N/A;     Current Meds  Medication Sig    alclomethasone (ACLOVATE) 0.05 % cream Apply 1 application topically daily as needed (rash).   amLODipine (NORVASC) 10 MG tablet Take 10 mg by mouth daily.   apixaban (ELIQUIS) 5 MG TABS tablet Take 1 tablet (5 mg total) by mouth 2 (two) times daily.   cetirizine (ZYRTEC) 10 MG tablet Take 10 mg by mouth daily.   diltiazem (CARDIZEM) 30 MG tablet TAKE 1 TABLET EVERY 4 HOURS AS NEEDED FOR AF HEART RATE >100 AS LONG AS TOP BP >100.   dofetilide (TIKOSYN) 250 MCG capsule Take 1 capsule (250 mcg total) by mouth 2 (two) times daily.   doxazosin (CARDURA) 8 MG tablet TAKE 1 TABLET BY MOUTH EVERYDAY AT BEDTIME   EPINEPHrine 0.3 mg/0.3 mL IJ SOAJ injection Inject 0.3 mg into the muscle as needed for anaphylaxis.   finasteride (PROSCAR) 5 MG tablet Take 5 mg by mouth daily.   furosemide (LASIX) 20 MG tablet TAKE 1 TABLET BY MOUTH EVERY DAY   losartan (COZAAR) 100 MG tablet Take 100 mg by mouth daily.   metoprolol succinate (TOPROL-XL) 25 MG 24 hr tablet Take 0.5 tablets (12.5 mg total) by mouth daily. May take an extra 1/2 tablet for breakthrough afib (Patient taking differently: Take 25 mg by mouth daily. 1  tablet  prn for breakthrough afib)   Multiple Vitamin (MULITIVITAMIN WITH MINERALS) TABS Take 1 tablet by mouth every evening.    NON FORMULARY Allergy shots- Takes once a month   omeprazole (PRILOSEC) 20 MG capsule Take 20 mg by mouth every morning.   pravastatin (PRAVACHOL) 80 MG tablet Take 80 mg by mouth at bedtime.    tamsulosin (FLOMAX) 0.4 MG CAPS capsule Take 0.4 mg by mouth daily after breakfast.     Allergies:   Honey bee treatment [bee venom], Wasp venom, Neosporin [neomycin-bacitracin zn-polymyx], Nsaids, Contrast media [iodinated contrast media], and Neo-bacit-poly-lidocaine   Social History   Tobacco Use   Smoking status: Never   Smokeless tobacco: Former    Types: Chew    Quit date: 07/13/2008  Vaping Use   Vaping Use: Never used  Substance Use Topics   Alcohol use: Not  Currently    Alcohol/week: 1.0 standard drink of alcohol    Types: 1 Cans of beer per week    Comment: out to eat   Drug use: No     Family Hx: The patient's family history includes Heart attack in his father; Lung cancer in his mother; Stomach cancer in his mother. There is no history of Anesthesia problems.  ROS:   Please see the history of present illness.     All other systems reviewed and are negative.   Prior CV studies:   The following studies were reviewed today:  PAP compliance download  Labs/Other Tests and Data Reviewed:  EKG:  No ECG reviewed.  Recent Labs: 11/02/2022: BUN 18; Creatinine, Ser 1.31; Hemoglobin 13.9; Magnesium 2.1; Platelets 104; Potassium 4.2; Sodium 143   Recent Lipid Panel Lab Results  Component Value Date/Time   CHOL 140 07/30/2021 12:00 AM   TRIG 76 07/30/2021 12:00 AM   HDL 61 07/30/2021 12:00 AM   CHOLHDL 2.3 07/30/2021 12:00 AM   LDLCALC 64 07/30/2021 12:00 AM    Wt Readings from Last 3 Encounters:  11/20/22 207 lb (93.9 kg)  11/02/22 207 lb 3.2 oz (94 kg)  05/04/22 210 lb 6.4 oz (95.4 kg)     Risk Assessment/Calculations:    CHA2DS2-VASc Score =    This indicates a  % annual risk of stroke. The patient's score is based upon:         Objective:    Vital Signs:  BP (!) 146/66   Pulse 82   Ht 5' 11.5" (1.816 m)   Wt 207 lb (93.9 kg)   SpO2 96%   BMI 28.47 kg/m    GEN: Well nourished, well developed in no acute distress HEENT: Normal NECK: No JVD; No carotid bruits LYMPHATICS: No lymphadenopathy CARDIAC:RRR, no murmurs, rubs, gallops RESPIRATORY:  Clear to auscultation without rales, wheezing or rhonchi  ABDOMEN: Soft, non-tender, non-distended MUSCULOSKELETAL:  No edema; No deformity  SKIN: Warm and dry NEUROLOGIC:  Alert and oriented x 3 PSYCHIATRIC:  Normal affect   ASSESSMENT & PLAN:    OSA - The patient is tolerating PAP therapy well without any problems. The PAP download performed by his DME was  personally reviewed and interpreted by me today and showed an AHI of 3.8/hr on 10 cm H2O with 90% compliance in using more than 4 hours nightly.  The patient has been using and benefiting from PAP use and will continue to benefit from therapy.   2.  Hyperlipidemia -LDL goal is less than 70 -I have personally reviewed and interpreted outside labs performed by patient's PCP which showed LDL 72 and HDL 58 05/29/2022 -Continue prescription drug with pravastatin 80 mg daily with as needed refills  3.  ASCAD -cardiac cath in 2019 showing mild ASCAD with 30% mLAD at the bifurcation of a large Diagonal with 30% pD1 and otherwise ostial and mid RCA luminal irregularities. LVEDP was elevated as well as PCW at 18-4mmHg and was felt to have a NICM possibly related to tachy medicated process from afib.  -He has not had any anginal symptoms since I saw him last -No ASA due to DOAC -Continue prescription management pravastatin 80 mg daily Toprol-XL 12.5 mg daily with as needed refills  4.  Paroxysmal atrial fibrillation -Followed by Dr. Nelly Laurence  in A. fib clinic -s/p recent afib ablation 06/2021 -he has had intermittent breakthrough of palpitations since having COVID in Dec 2023 and now having some breakthrough but has not had any in 4 weeks and uses Cardizem short acting PRN for palpitations -Continue prescription management with Eliquis 5 mg twice daily and Tikosyn 250 mcg twice daily with as needed refills -He denies any bleeding problems on DOAC -I have personally reviewed and interpreted outside labs performed by patient's PCP which showed serum creatinine 1.31 and potassium 4.2 on 11/02/2022 and hemoglobin 13.9 on 05/29/2022. Platelet count was 102K and is being worked up by PCP  5.  Nonischemic dilated cardiomyopathy -Minimal nonobstructive disease on cath felt related to tachycardia mediated process from his atrial fibrillation -EF was 20 to 25% by echo 2019 and normalized at 55 to 60%  on echo  2020 -Continue prescription drug management with losartan 100 mg daily and Lasix 20mg  daily  with as needed refills  6.  HTN -BP is borderline controlled on exam but at home runs about 140/48mmHg -Continue prescription drug meant with Cardura 8 mg nightly, amlodipine 10 mg daily, losartan 100 mg daily with as needed refills      Medication Adjustments/Labs and Tests Ordered: Current medicines are reviewed at length with the patient today.  Concerns regarding medicines are outlined above.   Tests Ordered: No orders of the defined types were placed in this encounter.   Medication Changes: No orders of the defined types were placed in this encounter.   Follow Up:  In Person in 1 year(s)  Signed, Armanda Magic, MD  11/20/2022 11:36 AM    [facility]

## 2022-11-20 NOTE — Telephone Encounter (Signed)
Per Dr Mayford Knife, he had CHoice and wants to use [facility] in [facility] ordered  Upon patient request DME selection is [facility]. Patient understands he will be contacted by [facility] to set up his cpap. Patient understands to call if [facility] does not contact him with new setup in a timely manner. Patient understands they will be called once confirmation has been received from [facility] that they have received their new machine to schedule 10 week follow up appointment.   [facility] notified of new cpap order  Please add to airview Patient was grateful for the call and thanked me.

## 2022-11-20 NOTE — Patient Instructions (Addendum)
Medication Instructions:  Your physician recommends that you continue on your current medications as directed. Please refer to the Current Medication list given to you today.  *If you need a refill on your cardiac medications before your next appointment, please call your pharmacy*   Lab Work: None.  If you have labs (blood work) drawn today and your tests are completely normal, you will receive your results only by: MyChart Message (if you have MyChart) OR A paper copy in the mail If you have any lab test that is abnormal or we need to change your treatment, we will call you to review the results.   Testing/Procedures: None.   Follow-Up: At [facility], you and your health needs are our priority.  As part of our continuing mission to provide you with exceptional heart care, we have created designated Provider Care Teams.  These Care Teams include your primary Cardiologist (physician) and Advanced Practice Providers (APPs -  Physician Assistants and Nurse Practitioners) who all work together to provide you with the care you need, when you need it.  We recommend signing up for the patient portal called "MyChart".  Sign up information is provided on this After Visit Summary.  MyChart is used to connect with patients for Virtual Visits (Telemedicine).  Patients are able to view lab/test results, encounter notes, upcoming appointments, etc.  Non-urgent messages can be sent to your provider as well.   To learn more about what you can do with MyChart, go to [email or web address].    Your next appointment:   1 year(s)  Provider:   Armanda Magic, MD     Other Instructions Your new DME company may contact you regarding your cpap supplies.

## 2022-11-26 NOTE — Telephone Encounter (Signed)
Patient states he needs a call back to discuss CPAP and how [facility] is having issues getting this. Please advise.

## 2023-01-08 ENCOUNTER — Other Ambulatory Visit: Payer: Self-pay | Admitting: Cardiology

## 2023-02-01 ENCOUNTER — Ambulatory Visit: Payer: Medicare Other | Admitting: Cardiovascular Disease

## 2023-03-02 IMAGING — CT CT HEART MORPH/PULM VEIN W/ CM & W/O CA SCORE
2 of 5 series · 10 of 20 positions shown, 12 images · non-contrast
Comparison: None.
COMPARISON: None.

Addendum:
EXAM:
OVER-READ INTERPRETATION  CT CHEST

The following report is an over-read performed by radiologist Dr.
Nii Odoi Malvin [[email or web address]] on 05/08/2021. This
over-read does not include interpretation of cardiac or coronary
anatomy or pathology. The cardiac CTA interpretation by the
cardiologist is attached.
CLINICAL DATA: Atrial fibrillation scheduled for ablation.
Cardiac CTA
TECHNIQUE: A non-contrast, gated CT scan was obtained with axial slices of 3 mm
through the heart for calcium scoring. Calcium scoring was performed
using the Agatston method. A 130 kV prospective, gated, contrast
cardiac scan was obtained. Gantry rotation speed was 250 msecs and
collimation was 0.6 mm. Nitroglycerin was not given. A delayed scan
was obtained to exclude left atrial appendage thrombus. The 3D
dataset was reconstructed in 5% intervals of the 25-50% of the R-R
cycle. Late systolic phases were analyzed on a dedicated workstation
using MPR, MIP, and VRT modes. The patient received 80 cc of
contrast.

[Series 6: best syst · axial · 0.39mm/px · z∈[+1149,+1235]mm · 7 of 288 slices shown, 9 images]
[im 36/288  vessel]
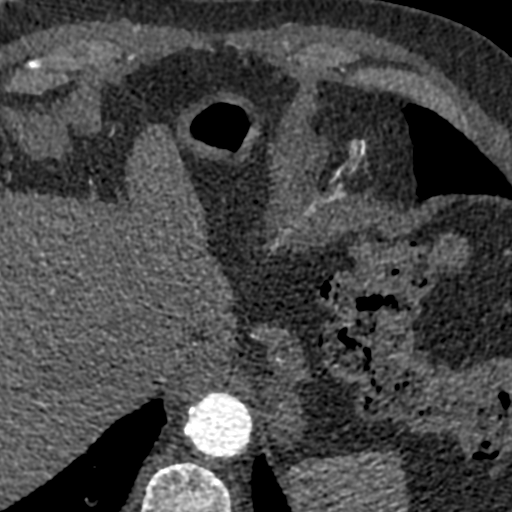
[im 36/288  lung]
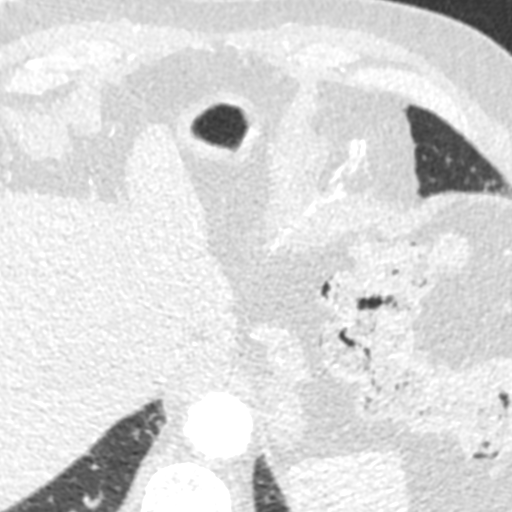
[im 72/288  vessel]
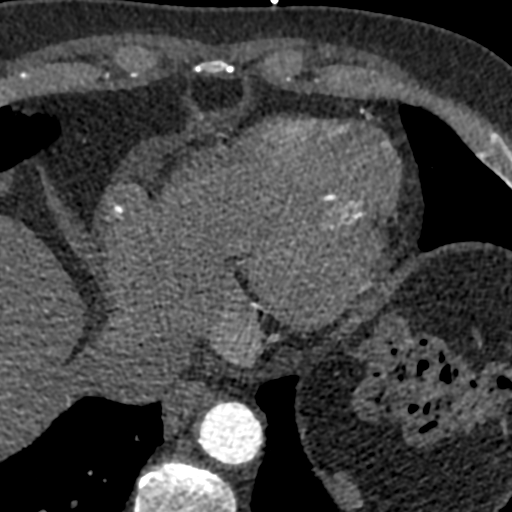
[im 108/288  vessel]
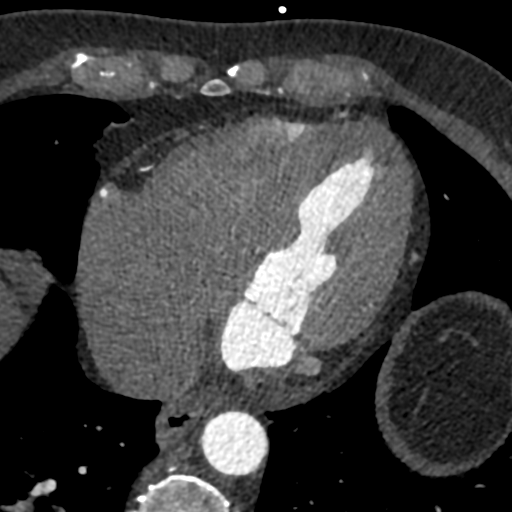
[im 144/288  vessel]
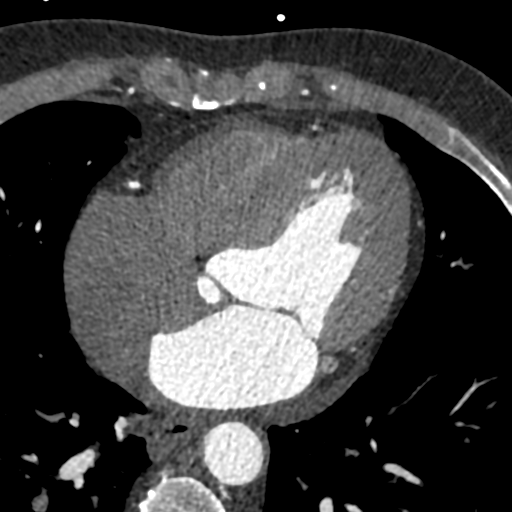
[im 180/288  vessel]
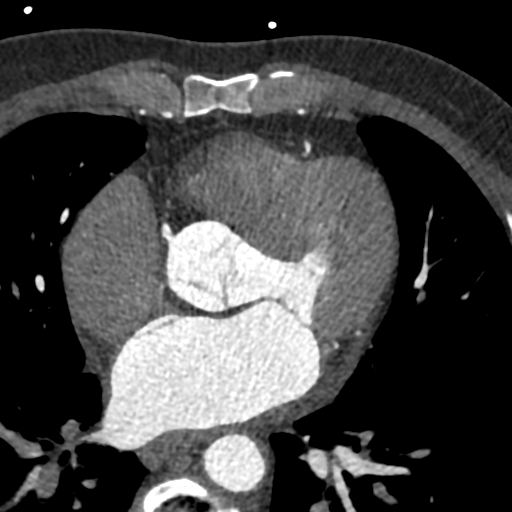
[im 180/288  lung]
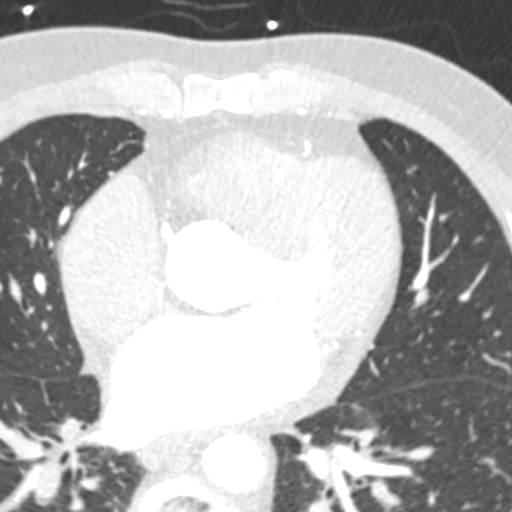
[im 216/288  vessel]
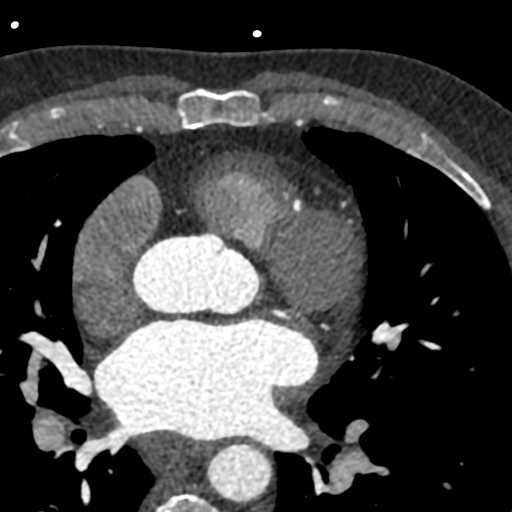
[im 252/288  vessel]
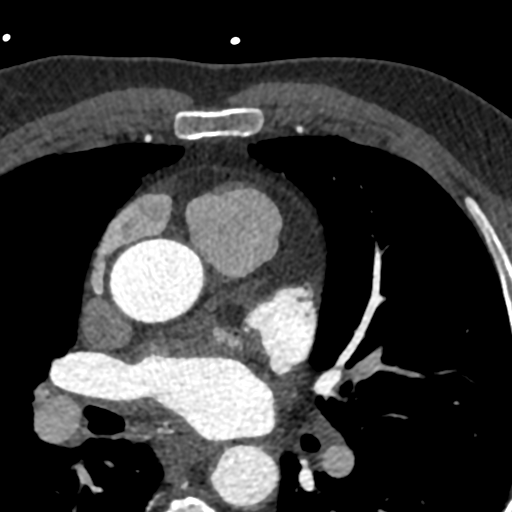

[Series 10: pv delay · axial · portal-venous · 0.39mm/px · z∈[+1187,+1228]mm · 3 of 167 slices shown]
[im 42/167  vessel]
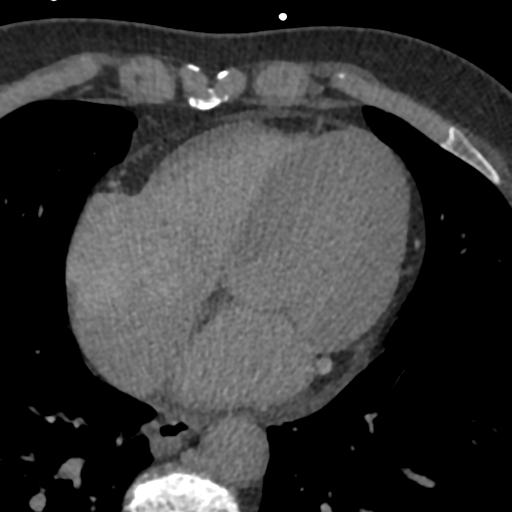
[im 84/167  vessel]
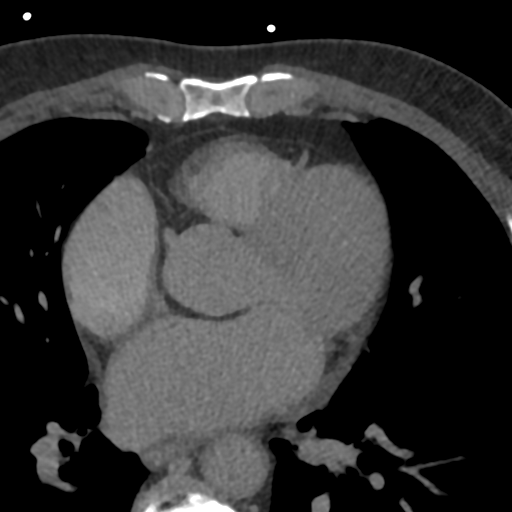
[im 125/167  vessel]
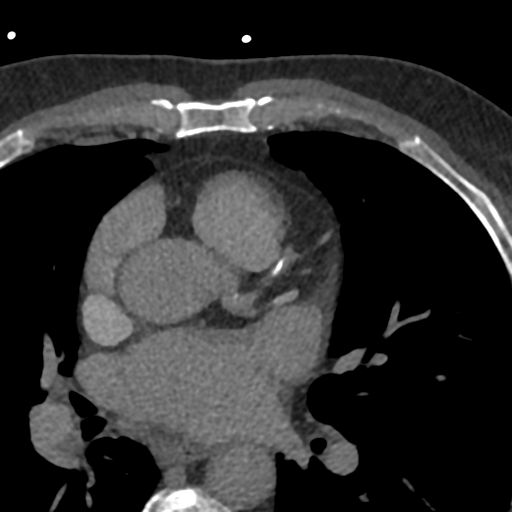

[10 of 20 positions shown; findings below may reference images not displayed]

FINDINGS: Vascular: No significant noncardiac vascular findings.

Mediastinum/Nodes: Calcified left hilar lymph nodes are consistent
with prior granulomatous disease.

Lungs/Pleura: Visualized lungs show no evidence of pulmonary edema,
consolidation, pneumothorax, nodule or pleural fluid.

Upper Abdomen: No acute abnormality.

Musculoskeletal: No chest wall mass or suspicious bone lesions
identified.
IMPRESSION: Calcified left hilar lymph nodes are consistent with prior
granulomatous disease.
FINDINGS: Image quality: Excellent.

Noise artifact is: Limited.

Pulmonary Veins: There is normal pulmonary vein drainage into the
left atrium (2 on the right and 2 on the left) with ostial
measurements as follows:

RUPV: Ostium 23 mm x 20 mm  area 337 mm2

RLPV:  Ostium 27 mm x 24 mm  area 490 mm2

LUPV:  Ostium 19 mm x 18 mm area 262 mm2

LLPV:  Ostium 24.5 mm x 16 mm  area 302 mm2

Left Atrium: The left atrial size is dilated. A small PFO is
present. The left atrial appendage is large broccoli type with two
lobes and ostial size 27.4 mm x 22.5 mm mm and length 30.2 mm. There
is no thrombus in the left atrial appendage on contrast or delayed
imaging. The esophagus runs in close proximity to the right inferior
pulmonary vein ostium.

Coronary Arteries: CAC score of 433, which is 63rd percentile for
age-, race-, and sex-matched controls. Normal coronary origin. Right
dominance. The study was performed without use of NTG and is
insufficient for plaque evaluation. The following assessment was
made:

Left main: The left main is a large caliber vessel with a normal
take off from the left coronary cusp that bifurcates to form a left
anterior descending artery and a left circumflex artery. There is no
plaque or stenosis.

Left anterior descending artery: There is minimal calcified plaque
(<25%) in the proximal LAD. The mid and distal segments are patent.
The LAD gives off 2 patent diagonal branches.

Left circumflex artery: The LCX is non-dominant and patent with no
evidence of plaque or stenosis. The LCX gives off 2 patent obtuse
marginal branches.

Right coronary artery: The RCA is dominant with normal take off from
the right coronary cusp. There is minimal calcified plaque (<25%) in
the proximal RCA. The RCA terminates as a PDA and right
posterolateral branch without evidence of plaque or stenosis.

Right Atrium: Right atrial size is within normal limits.

Right Ventricle: The right ventricular cavity is within normal
limits.

Left Ventricle: The ventricular cavity size is within normal limits.
There are no stigmata of prior infarction. There is no abnormal
filling defect.

Pericardium: Normal thickness with no significant effusion or
calcium present.

Pulmonary Artery: Normal caliber without proximal filling defect.

Cardiac valves: The aortic valve is trileaflet without significant
calcification. The mitral valve is normal structure without
significant calcification.

Aorta: Normal caliber with no significant disease.

Extra-cardiac findings: See attached radiology report for
non-cardiac structures.
IMPRESSION: 1. There is normal pulmonary vein drainage into the left atrium with
ostial measurements above.

2. There is no thrombus in the left atrial appendage.

3. The esophagus runs in close proximity to the right inferior
pulmonary vein ostium.

4. A small PFO is present.

5. Normal coronary origin. Right dominance.

6. CAC score of 433, which is 63rd percentile for age-, race-, and
sex-matched controls.

7. Minimal calcified plaque (<25%) in the proximal LAD and RCA.

*** End of Addendum ***
EXAM:
OVER-READ INTERPRETATION  CT CHEST

The following report is an over-read performed by radiologist Dr.
Nii Odoi Malvin [[email or web address]] on 05/08/2021. This
over-read does not include interpretation of cardiac or coronary
anatomy or pathology. The cardiac CTA interpretation by the
cardiologist is attached.
FINDINGS: Vascular: No significant noncardiac vascular findings.

Mediastinum/Nodes: Calcified left hilar lymph nodes are consistent
with prior granulomatous disease.

Lungs/Pleura: Visualized lungs show no evidence of pulmonary edema,
consolidation, pneumothorax, nodule or pleural fluid.

Upper Abdomen: No acute abnormality.

Musculoskeletal: No chest wall mass or suspicious bone lesions
identified.
IMPRESSION: Calcified left hilar lymph nodes are consistent with prior
granulomatous disease.

## 2023-03-09 NOTE — Progress Notes (Unsigned)
Cardiology Office Note Date:  03/09/2023  Patient ID:  John Cobb, John Cobb [birth year], MRN [id number] PCP:  [facility] [place]  Cardiologist:  Dr. Mayford Knife ([facility]) Electrophysiologist: Dr. Nelly Laurence    Chief Complaint:  6 mo  History of Present Illness: John Cobb is a 77 y.o. male with history of HTN, HLD, NICM, AFib, CKD (III)  He saw Dr. Nelly Laurence 05/04/22, doing well, stable QTc, planned 6 mo visit  I saw him 11/02/22 His AFib had been well controlled until Dec or so when he got Covid.  Since then he has been having intermittent episodes. Lasting a few hours to a few days. Occurring avery couple weeks, though has been 2-3 weeks now since his last episode, using prn Dilt and metoprolol (he does not take metoprolol daily as ordered) When in Afib, he feels fatigued, no energy. When not in AFib he feels quite well.  No CP, SOB, DOE He reports good medication compliance No near syncope or syncope. No bleeding or signs of bleeding Planned to monitor AF burden now recovered from his COVID and if burden remained increased, he wanted to consider repeat ablation. Planned to see Dr. Nelly Laurence at his next visit to discuss further  *** Tikosyn, meds, EKG, labs *** eliquis, dose, labs, bleeding *** burden  AFib/AAD hx Diagnosed 2019 Tikosyn started 2019 PVI ablation 06/26/21, Dr. Johney Frame   Past Medical History:  Diagnosis Date   Arthritis    Benign essential HTN 01/05/2018   Blood transfusion    As an infant   CKD (chronic kidney disease), stage III (HCC)    Dislocated IOL (intraocular lens), posterior 10/02/2011   High cholesterol    Hypertension    Nonischemic cardiomyopathy (HCC) 04/19/2018   Persistent atrial fibrillation (HCC)    Pneumonia ~ 1978    Past Surgical History:  Procedure Laterality Date   ANTERIOR CERVICAL DISCECTOMY  2008   APPENDECTOMY     "as an infant"   ATRIAL FIBRILLATION ABLATION N/A 06/26/2021   Procedure: ATRIAL FIBRILLATION  ABLATION;  Surgeon: Hillis Range, MD;  Location: [facility] INVASIVE CV LAB;  Service: Cardiovascular;  Laterality: N/A;   CARDIOVERSION N/A 03/11/2018   Procedure: CARDIOVERSION;  Surgeon: Chilton Si, MD;  Location: [facility] ENDOSCOPY;  Service: Cardiovascular;  Laterality: N/A;   CARDIOVERSION N/A 04/21/2018   Procedure: CARDIOVERSION;  Surgeon: Chilton Si, MD;  Location: [facility] ENDOSCOPY;  Service: Cardiovascular;  Laterality: N/A;   CATARACT EXTRACTION W/ INTRAOCULAR LENS IMPLANT  08/13/11   left   CATARACT EXTRACTION W/ INTRAOCULAR LENS IMPLANT  09/03/11   right   Colonscopy     Compound Fractures  06/1980   right arm; repaired with 2 plates and 10 pins.   FRACTURE SURGERY     GAS/FLUID EXCHANGE  10/20/2011   Procedure: GAS/FLUID EXCHANGE;  Surgeon: Sherrie George, MD;  Location: [facility] OR;  Service: Ophthalmology;  Laterality: Right;   HERNIA REPAIR     "umbilical; as an infant"   INTRAOCULAR LENS EXCHANGE  10/20/11   right eye   PARS PLANA VITRECTOMY  10/20/2011   Procedure: PARS PLANA VITRECTOMY WITH 25G REMOVAL/SUTURE INTRAOCULAR LENS;  Surgeon: Sherrie George, MD;  Location: [facility] OR;  Service: Ophthalmology;  Laterality: Right;   RIGHT/LEFT HEART CATH AND CORONARY ANGIOGRAPHY N/A 01/20/2018   Procedure: RIGHT/LEFT HEART CATH AND CORONARY ANGIOGRAPHY;  Surgeon: Lyn Records, MD;  Location: [facility] INVASIVE CV LAB;  Service: Cardiovascular;  Laterality: N/A;    Current Outpatient Medications  Medication Sig Dispense Refill   alclomethasone (ACLOVATE) 0.05 % cream Apply 1 application topically daily as needed (rash).     amLODipine (NORVASC) 10 MG tablet Take 10 mg by mouth daily.     apixaban (ELIQUIS) 5 MG TABS tablet Take 1 tablet (5 mg total) by mouth 2 (two) times daily. 180 tablet 1   cetirizine (ZYRTEC) 10 MG tablet Take 10 mg by mouth daily.     diltiazem (CARDIZEM) 30 MG tablet TAKE 1 TABLET EVERY 4 HOURS AS NEEDED FOR AF HEART RATE >100 AS LONG AS TOP BP >100. 30 tablet 9   dofetilide  (TIKOSYN) 250 MCG capsule Take 1 capsule (250 mcg total) by mouth 2 (two) times daily. 180 capsule 3   doxazosin (CARDURA) 8 MG tablet TAKE 1 TABLET BY MOUTH EVERYDAY AT BEDTIME 90 tablet 3   EPINEPHrine 0.3 mg/0.3 mL IJ SOAJ injection Inject 0.3 mg into the muscle as needed for anaphylaxis.     finasteride (PROSCAR) 5 MG tablet Take 5 mg by mouth daily.     furosemide (LASIX) 20 MG tablet TAKE 1 TABLET BY MOUTH EVERY DAY 90 tablet 3   losartan (COZAAR) 100 MG tablet Take 100 mg by mouth daily.     metoprolol succinate (TOPROL-XL) 25 MG 24 hr tablet Take 0.5 tablets (12.5 mg total) by mouth daily. May take an extra 1/2 tablet for breakthrough afib (Patient taking differently: Take 25 mg by mouth daily. 1  tablet  prn for breakthrough afib) 90 tablet 2   Multiple Vitamin (MULITIVITAMIN WITH MINERALS) TABS Take 1 tablet by mouth every evening.      NON FORMULARY Allergy shots- Takes once a month     omeprazole (PRILOSEC) 20 MG capsule Take 20 mg by mouth every morning.     pravastatin (PRAVACHOL) 80 MG tablet Take 80 mg by mouth at bedtime.      tamsulosin (FLOMAX) 0.4 MG CAPS capsule Take 0.4 mg by mouth daily after breakfast.     No current facility-administered medications for this visit.    Allergies:   Honey bee treatment [bee venom], Wasp venom, Neosporin [neomycin-bacitracin zn-polymyx], Nsaids, Contrast media [iodinated contrast media], and Neo-bacit-poly-lidocaine   Social History:  The patient  reports that he has never smoked. He quit smokeless tobacco use about 14 years ago.  His smokeless tobacco use included chew. He reports that he does not currently use alcohol after a past usage of about 1.0 standard drink of alcohol per week. He reports that he does not use drugs.   Family History:  The patient's family history includes Heart attack in his father; Lung cancer in his mother; Stomach cancer in his mother.  ROS:  Please see the history of present illness.    All other systems are  reviewed and otherwise negative.   PHYSICAL EXAM:  VS:  There were no vitals taken for this visit. BMI: There is no height or weight on file to calculate BMI. Well nourished, well developed, in no acute distress HEENT: normocephalic, atraumatic Neck: no JVD, carotid bruits or masses Cardiac: *** RRR; no significant murmurs, no rubs, or gallops Lungs: *** CTA b/l, no wheezing, rhonchi or rales Abd: soft, nontender MS: no deformity or atrophy Ext: *** no edema Skin: warm and dry, no rash Neuro:  No gross deficits appreciated Psych: euthymic mood, full affect   EKG:  Done today and reviewed by myself shows  ***   06/26/21: EPS/ablation CONCLUSIONS: 1. Sinus rhythm upon presentation.   2.  Intracardiac echo reveals a moderate sized left atrium with four separate pulmonary veins without evidence of pulmonary vein stenosis. 3. Successful electrical isolation and anatomical encircling of all four pulmonary veins with radiofrequency current.  A WACA approach was used 3. No early apparent complications.  08/24/2018: TTE 1. The left ventricle has normal systolic function, with an ejection  fraction of 55-60%. The cavity size was normal. There is mild concentric  left ventricular hypertrophy. Left ventricular diastolic Doppler  parameters are consistent with impaired  relaxation.   2. The right ventricle has normal systolic function. The cavity was  normal. There is no increase in right ventricular wall thickness.   3. The mitral valve is normal in structure.   4. The tricuspid valve is normal in structure.   5. The aortic valve is tricuspid There is mild thickening and mild  calcification of the aortic valve. Aortic valve regurgitation is trivial  by color flow Doppler.   6. The pulmonic valve was normal in structure.   7. Right atrial pressure is estimated at 3 mmHg.    01/20/2018: R/LHC Normal left main Proximal to mid LAD 30% eccentric narrowing at the bifurcation with a large  diagonal.  The first diagonal contains 30% eccentric stenosis.  The Circumflex origin to 2 obtuse marginal branches, both of which are widely patent. RCA is not well-visualized.  Ostial and mid vessel luminal irregularities.  No significant obstruction. Normal pulmonary pressures.  LVEDP 17 mmHg.  Pulmonary capillary wedge mean is 18 to 20 mmHg. Nonischemic cardiomyopathy, possibly related to atrial fibrillation and tachycardia induced mechanism.    Recent Labs: 11/02/2022: BUN 18; Creatinine, Ser 1.31; Magnesium 2.1; Potassium 4.2; Sodium 143 11/20/2022: Hemoglobin 13.2; Platelets 177  No results found for requested labs within last 365 days.   CrCl cannot be calculated (Patient's most recent lab result is older than the maximum 21 days allowed.).   Wt Readings from Last 3 Encounters:  11/20/22 207 lb (93.9 kg)  11/02/22 207 lb 3.2 oz (94 kg)  05/04/22 210 lb 6.4 oz (95.4 kg)     Other studies reviewed: Additional studies/records reviewed today include: summarized above  ASSESSMENT AND PLAN:  Persistent AFib CHA2DS2Vasc is 4, on eliquis, *** appropriately dosed Tikosyn, *** stable QTc ***  Labs today  NICM Recovered LVEF *** No symptoms or exam findings of volume OL  HTN *** Looks good  Secondary hypercoagulable state    Disposition: ***  Current medicines are reviewed at length with the patient today.  The patient did not have any concerns regarding medicines.  Norma Fredrickson, PA-C 03/09/2023 7:27 AM     [facility] [address] [place] [address] [phone] (office)  [phone] (fax)

## 2023-03-10 ENCOUNTER — Encounter: Payer: Self-pay | Admitting: Physician Assistant

## 2023-03-10 ENCOUNTER — Ambulatory Visit: Payer: Medicare Other | Attending: Cardiovascular Disease | Admitting: Physician Assistant

## 2023-03-10 VITALS — BP 158/90 | HR 71 | Ht 71.5 in | Wt 208.0 lb

## 2023-03-10 DIAGNOSIS — I428 Other cardiomyopathies: Secondary | ICD-10-CM

## 2023-03-10 DIAGNOSIS — Z79899 Other long term (current) drug therapy: Secondary | ICD-10-CM

## 2023-03-10 DIAGNOSIS — Z5181 Encounter for therapeutic drug level monitoring: Secondary | ICD-10-CM

## 2023-03-10 DIAGNOSIS — I480 Paroxysmal atrial fibrillation: Secondary | ICD-10-CM

## 2023-03-10 NOTE — Patient Instructions (Signed)
Medication Instructions:   Your physician recommends that you continue on your current medications as directed. Please refer to the Current Medication list given to you today.  *If you need a refill on your cardiac medications before your next appointment, please call your pharmacy*   Lab Work:  BMET AND MAG TODAY    If you have labs (blood work) drawn today and your tests are completely normal, you will receive your results only by: MyChart Message (if you have MyChart) OR A paper copy in the mail If you have any lab test that is abnormal or we need to change your treatment, we will call you to review the results.   Testing/Procedures: NONE ORDERED  TODAY     Follow-Up: At [facility], you and your health needs are our priority.  As part of our continuing mission to provide you with exceptional heart care, we have created designated Provider Care Teams.  These Care Teams include your primary Cardiologist (physician) and Advanced Practice Providers (APPs -  Physician Assistants and Nurse Practitioners) who all work together to provide you with the care you need, when you need it.  We recommend signing up for the patient portal called "MyChart".  Sign up information is provided on this After Visit Summary.  MyChart is used to connect with patients for Virtual Visits (Telemedicine).  Patients are able to view lab/test results, encounter notes, upcoming appointments, etc.  Non-urgent messages can be sent to your provider as well.   To learn more about what you can do with MyChart, go to [email or web address].    Your next appointment:   4 month(s)  ( CONTACT EP SCHEDULING ISSUES )   Provider:   You may see Maurice Small, MD or one of the following Advanced Practice Providers on your designated Care Team:   Francis Dowse, [place]  Other Instructions

## 2023-03-11 LAB — BASIC METABOLIC PANEL
BUN/Creatinine Ratio: 14 (ref 10–24)
BUN: 16 mg/dL (ref 8–27)
CO2: 24 mmol/L (ref 20–29)
Calcium: 9.2 mg/dL (ref 8.6–10.2)
Chloride: 101 mmol/L (ref 96–106)
Creatinine, Ser: 1.17 mg/dL (ref 0.76–1.27)
Glucose: 127 mg/dL — ABNORMAL HIGH (ref 70–99)
Potassium: 4.1 mmol/L (ref 3.5–5.2)
Sodium: 141 mmol/L (ref 134–144)
eGFR: 65 mL/min/{1.73_m2} (ref >59)

## 2023-03-11 LAB — MAGNESIUM: Magnesium: 2.1 mg/dL (ref 1.6–2.3)

## 2023-03-23 ENCOUNTER — Other Ambulatory Visit: Payer: Self-pay

## 2023-03-23 MED ORDER — FUROSEMIDE 20 MG PO TABS: 20.0000 mg | ORAL_TABLET | Freq: Every day | ORAL | 3 refills | Status: DC

## 2023-05-12 ENCOUNTER — Other Ambulatory Visit: Payer: Self-pay | Admitting: Cardiology

## 2023-05-12 DIAGNOSIS — I480 Paroxysmal atrial fibrillation: Secondary | ICD-10-CM

## 2023-05-12 NOTE — Telephone Encounter (Signed)
Prescription refill request for Eliquis received. Indication: PAF Last office visit: 03/10/23  Almyra Free PA-C Scr: 1.20 on 04/16/23  Epic Age: 77 Weight: 94.3kg  Based on above findings Eliquis 5mg  twice daily is the appropriate dose.  Refill approved.

## 2023-07-23 DIAGNOSIS — Z13220 Encounter for screening for lipoid disorders: Secondary | ICD-10-CM | POA: Diagnosis not present

## 2023-07-23 DIAGNOSIS — R202 Paresthesia of skin: Secondary | ICD-10-CM | POA: Diagnosis not present

## 2023-07-23 DIAGNOSIS — E1122 Type 2 diabetes mellitus with diabetic chronic kidney disease: Secondary | ICD-10-CM | POA: Diagnosis not present

## 2023-07-23 DIAGNOSIS — I10 Essential (primary) hypertension: Secondary | ICD-10-CM | POA: Diagnosis not present

## 2023-07-23 DIAGNOSIS — R200 Anesthesia of skin: Secondary | ICD-10-CM | POA: Diagnosis not present

## 2023-07-26 ENCOUNTER — Encounter: Payer: Self-pay | Admitting: Cardiovascular Disease

## 2023-07-26 ENCOUNTER — Ambulatory Visit: Payer: Medicare Other | Attending: Cardiovascular Disease | Admitting: Cardiovascular Disease

## 2023-07-26 VITALS — BP 142/82 | HR 64 | Ht 71.5 in | Wt 207.2 lb

## 2023-07-26 DIAGNOSIS — I480 Paroxysmal atrial fibrillation: Secondary | ICD-10-CM

## 2023-07-26 NOTE — Patient Instructions (Signed)
 Medication Instructions:  Your physician recommends that you continue on your current medications as directed. Please refer to the Current Medication list given to you today. *If you need a refill on your cardiac medications before your next appointment, please call your pharmacy*   Lab Work: BMET today - [facility] on first floor If you have labs (blood work) drawn today and your tests are completely normal, you will receive your results only by: MyChart Message (if you have MyChart) OR A paper copy in the mail If you have any lab test that is abnormal or we need to change your treatment, we will call you to review the results.  Follow-Up: At [facility], you and your health needs are our priority.  As part of our continuing mission to provide you with exceptional heart care, we have created designated Provider Care Teams.  These Care Teams include your primary Cardiologist (physician) and Advanced Practice Providers (APPs -  Physician Assistants and Nurse Practitioners) who all work together to provide you with the care you need, when you need it.  We recommend signing up for the patient portal called MyChart.  Sign up information is provided on this After Visit Summary.  MyChart is used to connect with patients for Virtual Visits (Telemedicine).  Patients are able to view lab/test results, encounter notes, upcoming appointments, etc.  Non-urgent messages can be sent to your provider as well.   To learn more about what you can do with MyChart, go to [email or web address].    Your next appointment:   Please establish care with an EP provider near your new location   Provider:   Eulas Furbish, MD

## 2023-07-26 NOTE — Progress Notes (Signed)
  Cardiology Office Note:    Date:  07/26/2023   ID:  John Cobb, DOB [birth year], MRN [id number]  PCP:  Keren Millman, MD    [facility] Providers Cardiologist:  Wilbert Bihari, MD Electrophysiologist:  Eulas FORBES Furbish, MD     Referring MD: Keren Millman, MD   Chief complaint: follow-up for atrial fibrillation  History of Present Illness:    John Cobb is a 78 y.o. male with a hx of HTN, HLD, NICM amd atrial fibrillation who presents for routine electrophysiology follow-up.  He underwent AF ablation by Dr. Kelsie in December, 2022. He has been maintained on Tikosyn . He has had a few breakthrough episodes in times of increased stress.  He was seen by Jenna Arthur in clinic on April 22.  He notes waking up with atrial fibrillation the following day  He reports that since that time he has not had any recurrence of atrial fibrillation.      EKGs/Labs/Other Studies Reviewed:      EKG:   EKG Interpretation Date/Time:  Monday July 26 2023 13:34:35 EST Ventricular Rate:  64 PR Interval:  142 QRS Duration:  76 QT Interval:  416 QTC Calculation: 429 R Axis:   26  Text Interpretation: Normal sinus rhythm Normal ECG When compared with ECG of 10-Mar-2023 14:31, No significant change was found Confirmed by Furbish Eulas [id number]) on 07/26/2023 1:55:18 PM     Recent Labs: 11/20/2022: Hemoglobin 13.2; Platelets 177 03/10/2023: BUN 16; Creatinine, Ser 1.17; Magnesium  2.1; Potassium 4.1; Sodium 141    Physical Exam:    VS:  BP (!) 142/82   Pulse 64   Ht 5' 11.5 (1.816 m)   Wt 207 lb 3.2 oz (94 kg)   SpO2 97%   BMI 28.50 kg/m     Wt Readings from Last 3 Encounters:  07/26/23 207 lb 3.2 oz (94 kg)  03/10/23 208 lb (94.3 kg)  11/20/22 207 lb (93.9 kg)     GEN:  Well nourished, well developed in no acute distress CARDIAC: RRR, no murmurs, rubs, gallops RESPIRATORY:  Normal work of breathing MUSCULOSKELETAL:  edema    ASSESSMENT & PLAN:    Atrial  fibrillation:  appears to be maintaining sinus well s/p ablation on Tikosyn . Qtc today is appropriate to continue Tikosyn . Check BMET  High risk medications:  Tikosyn . Qtc ok. Continue semiannual follow-up.  Hypertension. BP elevated today.   Secondary hypercoagulable state: CHADS2Vasc is 3       He would like to transfer his care to physicians closer to his home. I advised him to establish with an electrophysiologist for management of his atrial fibrillation.   Medication Adjustments/Labs and Tests Ordered: Current medicines are reviewed at length with the patient today.  Concerns regarding medicines are outlined above.  Orders Placed This Encounter  Procedures   EKG 12-Lead   No orders of the defined types were placed in this encounter.    Signed, Eulas FORBES Furbish, MD  07/26/2023 1:59 PM    [facility]

## 2023-07-27 LAB — BASIC METABOLIC PANEL
BUN/Creatinine Ratio: 15 (ref 10–24)
BUN: 22 mg/dL (ref 8–27)
CO2: 22 mmol/L (ref 20–29)
Calcium: 9.2 mg/dL (ref 8.6–10.2)
Chloride: 104 mmol/L (ref 96–106)
Creatinine, Ser: 1.46 mg/dL — ABNORMAL HIGH (ref 0.76–1.27)
Glucose: 113 mg/dL — ABNORMAL HIGH (ref 70–99)
Potassium: 4.4 mmol/L (ref 3.5–5.2)
Sodium: 142 mmol/L (ref 134–144)
eGFR: 50 mL/min/{1.73_m2} — ABNORMAL LOW (ref >59)

## 2023-09-13 DIAGNOSIS — L814 Other melanin hyperpigmentation: Secondary | ICD-10-CM | POA: Diagnosis not present

## 2023-09-13 DIAGNOSIS — D2372 Other benign neoplasm of skin of left lower limb, including hip: Secondary | ICD-10-CM | POA: Diagnosis not present

## 2023-09-13 DIAGNOSIS — L570 Actinic keratosis: Secondary | ICD-10-CM | POA: Diagnosis not present

## 2023-09-13 DIAGNOSIS — L818 Other specified disorders of pigmentation: Secondary | ICD-10-CM | POA: Diagnosis not present

## 2023-09-13 DIAGNOSIS — L2089 Other atopic dermatitis: Secondary | ICD-10-CM | POA: Diagnosis not present

## 2023-09-13 DIAGNOSIS — X32XXXA Exposure to sunlight, initial encounter: Secondary | ICD-10-CM | POA: Diagnosis not present

## 2023-09-13 DIAGNOSIS — L821 Other seborrheic keratosis: Secondary | ICD-10-CM | POA: Diagnosis not present

## 2023-09-13 DIAGNOSIS — L718 Other rosacea: Secondary | ICD-10-CM | POA: Diagnosis not present

## 2023-09-17 DIAGNOSIS — G4733 Obstructive sleep apnea (adult) (pediatric): Secondary | ICD-10-CM | POA: Diagnosis not present

## 2023-09-29 DIAGNOSIS — R200 Anesthesia of skin: Secondary | ICD-10-CM | POA: Diagnosis not present

## 2023-09-29 DIAGNOSIS — G629 Polyneuropathy, unspecified: Secondary | ICD-10-CM | POA: Diagnosis not present

## 2023-10-01 DIAGNOSIS — Z122 Encounter for screening for malignant neoplasm of respiratory organs: Secondary | ICD-10-CM | POA: Diagnosis not present

## 2023-10-01 DIAGNOSIS — Z91030 Bee allergy status: Secondary | ICD-10-CM | POA: Diagnosis not present

## 2023-10-01 DIAGNOSIS — I482 Chronic atrial fibrillation, unspecified: Secondary | ICD-10-CM | POA: Diagnosis not present

## 2023-10-18 DIAGNOSIS — F17210 Nicotine dependence, cigarettes, uncomplicated: Secondary | ICD-10-CM | POA: Diagnosis not present

## 2023-10-18 DIAGNOSIS — Z122 Encounter for screening for malignant neoplasm of respiratory organs: Secondary | ICD-10-CM | POA: Diagnosis not present

## 2023-10-18 DIAGNOSIS — Z87891 Personal history of nicotine dependence: Secondary | ICD-10-CM | POA: Diagnosis not present

## 2023-10-18 DIAGNOSIS — R918 Other nonspecific abnormal finding of lung field: Secondary | ICD-10-CM | POA: Diagnosis not present

## 2023-10-18 DIAGNOSIS — I2510 Atherosclerotic heart disease of native coronary artery without angina pectoris: Secondary | ICD-10-CM | POA: Diagnosis not present

## 2023-10-19 DIAGNOSIS — N183 Chronic kidney disease, stage 3 unspecified: Secondary | ICD-10-CM | POA: Diagnosis not present

## 2023-10-19 DIAGNOSIS — I482 Chronic atrial fibrillation, unspecified: Secondary | ICD-10-CM | POA: Diagnosis not present

## 2023-10-19 DIAGNOSIS — I4891 Unspecified atrial fibrillation: Secondary | ICD-10-CM | POA: Diagnosis not present

## 2023-10-19 DIAGNOSIS — I499 Cardiac arrhythmia, unspecified: Secondary | ICD-10-CM | POA: Diagnosis not present

## 2023-10-19 DIAGNOSIS — I4819 Other persistent atrial fibrillation: Secondary | ICD-10-CM | POA: Diagnosis not present

## 2023-10-19 DIAGNOSIS — I129 Hypertensive chronic kidney disease with stage 1 through stage 4 chronic kidney disease, or unspecified chronic kidney disease: Secondary | ICD-10-CM | POA: Diagnosis not present

## 2023-10-22 DIAGNOSIS — I499 Cardiac arrhythmia, unspecified: Secondary | ICD-10-CM | POA: Diagnosis not present

## 2023-10-25 DIAGNOSIS — E1142 Type 2 diabetes mellitus with diabetic polyneuropathy: Secondary | ICD-10-CM | POA: Diagnosis not present

## 2023-11-03 ENCOUNTER — Other Ambulatory Visit: Payer: Self-pay | Admitting: Cardiology

## 2023-11-03 DIAGNOSIS — I480 Paroxysmal atrial fibrillation: Secondary | ICD-10-CM

## 2023-11-03 NOTE — Telephone Encounter (Signed)
 Prescription refill request for Eliquis  received. Indication:AFIB Last office visit:1/25 Scr:1.46  1/25 Age: 78 Weight:94  kg  Prescription refilled

## 2023-12-14 ENCOUNTER — Other Ambulatory Visit: Payer: Self-pay | Admitting: Physician Assistant

## 2023-12-14 ENCOUNTER — Other Ambulatory Visit: Payer: Self-pay | Admitting: Cardiology

## 2023-12-15 ENCOUNTER — Other Ambulatory Visit: Payer: Self-pay | Admitting: Physician Assistant

## 2024-01-19 DIAGNOSIS — H18421 Band keratopathy, right eye: Secondary | ICD-10-CM | POA: Diagnosis not present

## 2024-01-19 DIAGNOSIS — H59031 Cystoid macular edema following cataract surgery, right eye: Secondary | ICD-10-CM | POA: Diagnosis not present

## 2024-02-11 DIAGNOSIS — G4733 Obstructive sleep apnea (adult) (pediatric): Secondary | ICD-10-CM | POA: Diagnosis not present

## 2024-02-21 ENCOUNTER — Other Ambulatory Visit: Payer: Self-pay

## 2024-02-21 DIAGNOSIS — I480 Paroxysmal atrial fibrillation: Secondary | ICD-10-CM

## 2024-02-21 MED ORDER — APIXABAN 5 MG PO TABS: 5.0000 mg | ORAL_TABLET | Freq: Two times a day (BID) | ORAL | 1 refills | Status: AC

## 2024-02-21 NOTE — Telephone Encounter (Signed)
 Prescription refill request for Eliquis  received. Indication:Afib  Last office visit: 07/26/23 ([facility])  Scr: 1.46 (07/26/23)  Age: 78 Weight: 94kg  Appropriate dose. Refill sent.

## 2024-03-21 DIAGNOSIS — G8929 Other chronic pain: Secondary | ICD-10-CM | POA: Diagnosis not present

## 2024-03-21 DIAGNOSIS — M25552 Pain in left hip: Secondary | ICD-10-CM | POA: Diagnosis not present
